# Patient Record
Sex: Female | Born: 1935 | Race: White | Hispanic: No | Smoking: Never smoker
Health system: Southern US, Community
[De-identification: ages and names within clinical notes are randomized; demographics above are authoritative.]

## PROBLEM LIST (undated history)

## (undated) DIAGNOSIS — N289 Disorder of kidney and ureter, unspecified: Secondary | ICD-10-CM

## (undated) DIAGNOSIS — E538 Deficiency of other specified B group vitamins: Secondary | ICD-10-CM

## (undated) DIAGNOSIS — R609 Edema, unspecified: Secondary | ICD-10-CM

## (undated) DIAGNOSIS — R748 Abnormal levels of other serum enzymes: Secondary | ICD-10-CM

## (undated) DIAGNOSIS — F0390 Unspecified dementia without behavioral disturbance: Secondary | ICD-10-CM

## (undated) DIAGNOSIS — E785 Hyperlipidemia, unspecified: Secondary | ICD-10-CM

## (undated) DIAGNOSIS — M109 Gout, unspecified: Secondary | ICD-10-CM

## (undated) DIAGNOSIS — R42 Dizziness and giddiness: Secondary | ICD-10-CM

## (undated) DIAGNOSIS — L988 Other specified disorders of the skin and subcutaneous tissue: Secondary | ICD-10-CM

## (undated) DIAGNOSIS — M810 Age-related osteoporosis without current pathological fracture: Secondary | ICD-10-CM

## (undated) DIAGNOSIS — K2100 Gastro-esophageal reflux disease with esophagitis, without bleeding: Secondary | ICD-10-CM

## (undated) DIAGNOSIS — K5730 Diverticulosis of large intestine without perforation or abscess without bleeding: Secondary | ICD-10-CM

## (undated) DIAGNOSIS — M25569 Pain in unspecified knee: Secondary | ICD-10-CM

## (undated) DIAGNOSIS — I10 Essential (primary) hypertension: Secondary | ICD-10-CM

## (undated) DIAGNOSIS — N200 Calculus of kidney: Secondary | ICD-10-CM

## (undated) HISTORY — DX: Abnormal levels of other serum enzymes: R74.8

## (undated) HISTORY — DX: Essential (primary) hypertension: I10

## (undated) HISTORY — DX: Dizziness and giddiness: R42

## (undated) HISTORY — DX: Diverticulosis of large intestine without perforation or abscess without bleeding: K57.30

## (undated) HISTORY — DX: Disorder of kidney and ureter, unspecified: N28.9

## (undated) HISTORY — DX: Gastro-esophageal reflux disease with esophagitis: K21.0

## (undated) HISTORY — DX: Deficiency of other specified B group vitamins: E53.8

## (undated) HISTORY — DX: Edema, unspecified: R60.9

## (undated) HISTORY — DX: Hyperlipidemia, unspecified: E78.5

## (undated) HISTORY — DX: Gastro-esophageal reflux disease with esophagitis, without bleeding: K21.00

## (undated) HISTORY — DX: Age-related osteoporosis without current pathological fracture: M81.0

## (undated) HISTORY — DX: Calculus of kidney: N20.0

## (undated) HISTORY — DX: Pain in unspecified knee: M25.569

## (undated) HISTORY — DX: Other specified disorders of the skin and subcutaneous tissue: L98.8

---

## 1972-02-07 HISTORY — PX: GANGLION CYST EXCISION: SHX1691

## 1982-02-06 HISTORY — PX: FRACTURE SURGERY: SHX138

## 1997-11-02 ENCOUNTER — Other Ambulatory Visit: Admission: RE | Admit: 1997-11-02 | Discharge: 1997-11-02 | Payer: Self-pay | Admitting: Family Medicine

## 1999-02-14 ENCOUNTER — Other Ambulatory Visit: Admission: RE | Admit: 1999-02-14 | Discharge: 1999-02-14 | Payer: Self-pay | Admitting: Family Medicine

## 1999-02-23 ENCOUNTER — Encounter: Admission: RE | Admit: 1999-02-23 | Discharge: 1999-02-23 | Payer: Self-pay | Admitting: Family Medicine

## 1999-02-23 ENCOUNTER — Encounter: Payer: Self-pay | Admitting: Family Medicine

## 2000-11-02 ENCOUNTER — Encounter: Payer: Self-pay | Admitting: Family Medicine

## 2000-11-02 ENCOUNTER — Encounter: Admission: RE | Admit: 2000-11-02 | Discharge: 2000-11-02 | Payer: Self-pay | Admitting: Family Medicine

## 2000-11-14 ENCOUNTER — Encounter: Payer: Self-pay | Admitting: Family Medicine

## 2000-11-14 ENCOUNTER — Encounter: Admission: RE | Admit: 2000-11-14 | Discharge: 2000-11-14 | Payer: Self-pay | Admitting: Family Medicine

## 2001-10-31 ENCOUNTER — Encounter: Payer: Self-pay | Admitting: Specialist

## 2001-10-31 ENCOUNTER — Encounter: Admission: RE | Admit: 2001-10-31 | Discharge: 2001-10-31 | Payer: Self-pay | Admitting: Specialist

## 2001-11-11 ENCOUNTER — Encounter: Admission: RE | Admit: 2001-11-11 | Discharge: 2001-11-11 | Payer: Self-pay | Admitting: Family Medicine

## 2001-11-11 ENCOUNTER — Encounter: Payer: Self-pay | Admitting: Family Medicine

## 2003-01-21 ENCOUNTER — Encounter: Admission: RE | Admit: 2003-01-21 | Discharge: 2003-01-21 | Payer: Self-pay | Admitting: Family Medicine

## 2005-02-06 HISTORY — PX: EXCISION OF ADNEXAL MASS: SHX5820

## 2005-08-03 ENCOUNTER — Emergency Department (HOSPITAL_COMMUNITY): Admission: EM | Admit: 2005-08-03 | Discharge: 2005-08-03 | Payer: Self-pay | Admitting: Emergency Medicine

## 2005-08-07 ENCOUNTER — Ambulatory Visit (HOSPITAL_COMMUNITY): Admission: RE | Admit: 2005-08-07 | Discharge: 2005-08-07 | Payer: Self-pay | Admitting: Emergency Medicine

## 2005-09-06 ENCOUNTER — Other Ambulatory Visit: Admission: RE | Admit: 2005-09-06 | Discharge: 2005-09-06 | Payer: Self-pay | Admitting: Gynecologic Oncology

## 2005-09-06 ENCOUNTER — Encounter (INDEPENDENT_AMBULATORY_CARE_PROVIDER_SITE_OTHER): Payer: Self-pay | Admitting: *Deleted

## 2005-09-06 ENCOUNTER — Ambulatory Visit: Admission: RE | Admit: 2005-09-06 | Discharge: 2005-09-06 | Payer: Self-pay | Admitting: Gynecologic Oncology

## 2005-10-03 ENCOUNTER — Encounter (INDEPENDENT_AMBULATORY_CARE_PROVIDER_SITE_OTHER): Payer: Self-pay | Admitting: Specialist

## 2005-10-03 ENCOUNTER — Ambulatory Visit (HOSPITAL_COMMUNITY): Admission: RE | Admit: 2005-10-03 | Discharge: 2005-10-04 | Payer: Self-pay | Admitting: Gynecologic Oncology

## 2005-10-17 ENCOUNTER — Ambulatory Visit: Admission: RE | Admit: 2005-10-17 | Discharge: 2005-10-17 | Payer: Self-pay | Admitting: Gynecologic Oncology

## 2006-07-11 ENCOUNTER — Ambulatory Visit: Payer: Self-pay | Admitting: Gastroenterology

## 2007-06-29 DIAGNOSIS — E669 Obesity, unspecified: Secondary | ICD-10-CM | POA: Insufficient documentation

## 2007-06-29 DIAGNOSIS — N83209 Unspecified ovarian cyst, unspecified side: Secondary | ICD-10-CM

## 2007-06-29 DIAGNOSIS — Z87442 Personal history of urinary calculi: Secondary | ICD-10-CM | POA: Insufficient documentation

## 2008-01-13 ENCOUNTER — Encounter: Admission: RE | Admit: 2008-01-13 | Discharge: 2008-02-06 | Payer: Self-pay | Admitting: Family Medicine

## 2008-02-10 ENCOUNTER — Encounter: Admission: RE | Admit: 2008-02-10 | Discharge: 2008-03-26 | Payer: Self-pay | Admitting: Family Medicine

## 2008-02-21 IMAGING — CT CT ABDOMEN W/ CM
1 of 3 series · 13 of 32 positions shown, 18 images · IV contrast (omnipaque)
Comparison: None relevant.

CLINICAL DATA: Right upper quadrant abdominal pain and nausea for 1 day.  No previous relevant surgery.
ABDOMEN CT WITH CONTRAST:
TECHNIQUE: Multidetector CT imaging of the abdomen was performed following the standard protocol during bolus administration of intravenous contrast.
Contrast:  125 cc Omnipaque 300.  Oral contrast was given.
TECHNIQUE: Multidetector CT imaging of the pelvis was performed following the standard protocol during bolus administration of intravenous contrast.

[Series 2: abd_pel 5.0 b40s st · axial · 0.77mm/px · z∈[-476,-100]mm · 13 of 85 slices shown, 18 images]
[im 5/85  soft-tissue]
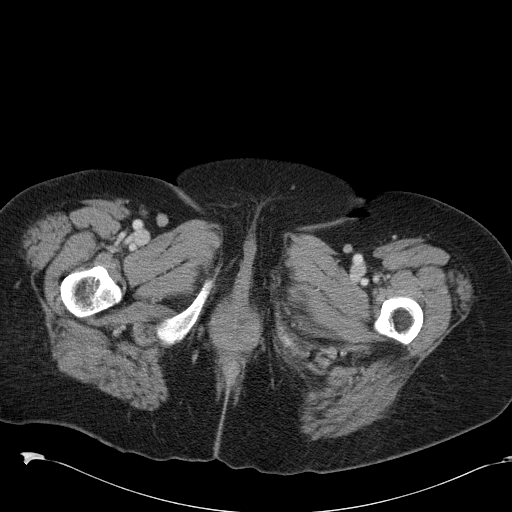
[im 5/85  bone]
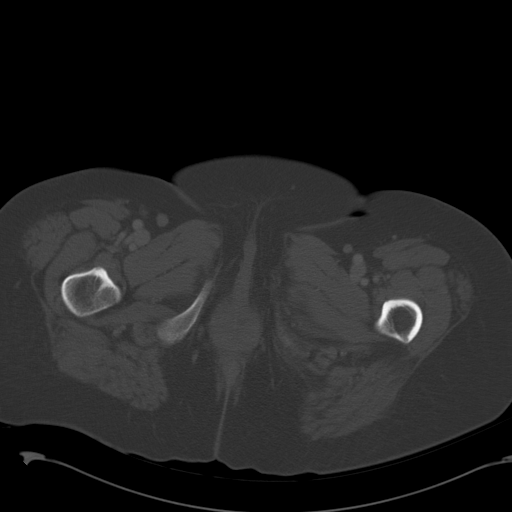
[im 14/85  soft-tissue]
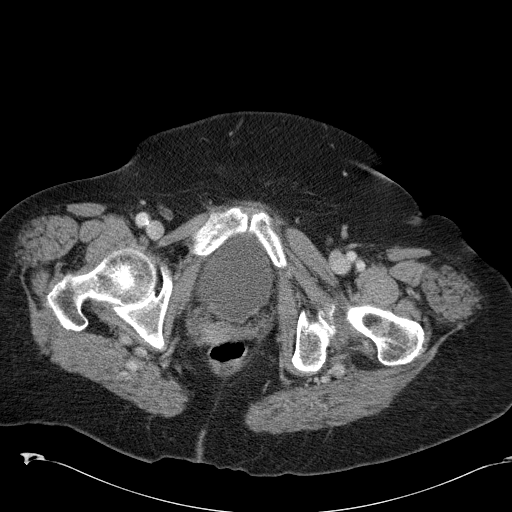
[im 18/85  soft-tissue]
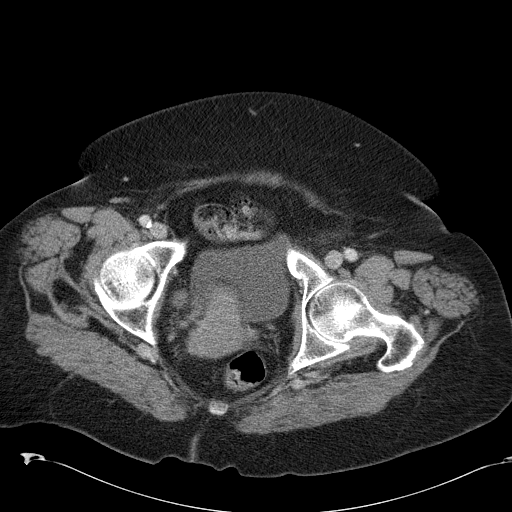
[im 27/85  soft-tissue]
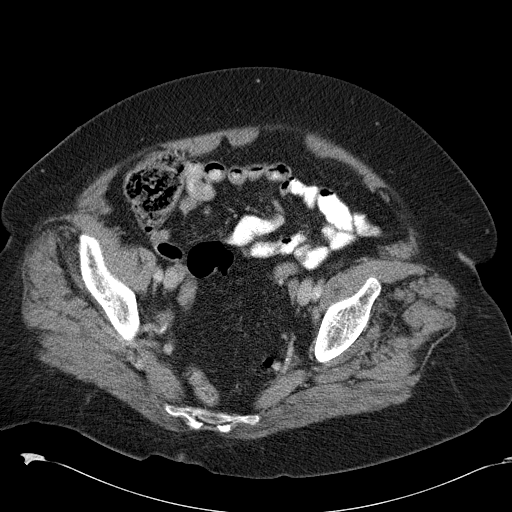
[im 31/85  soft-tissue]
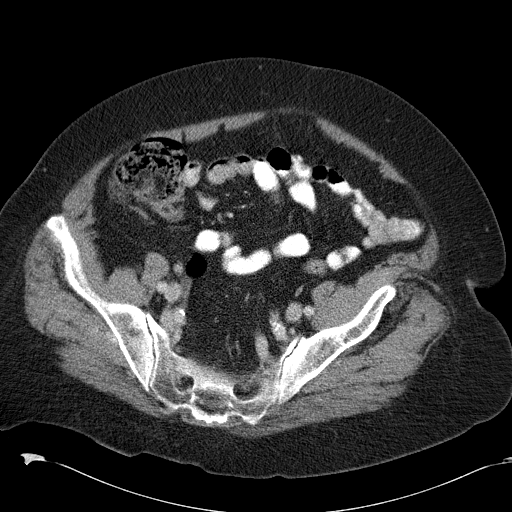
[im 40/85  soft-tissue]
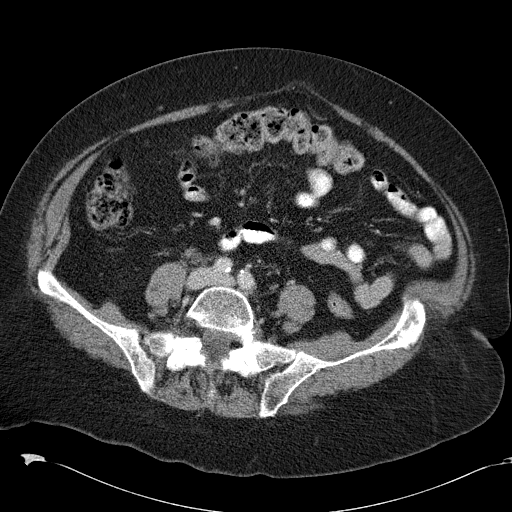
[im 45/85  soft-tissue]
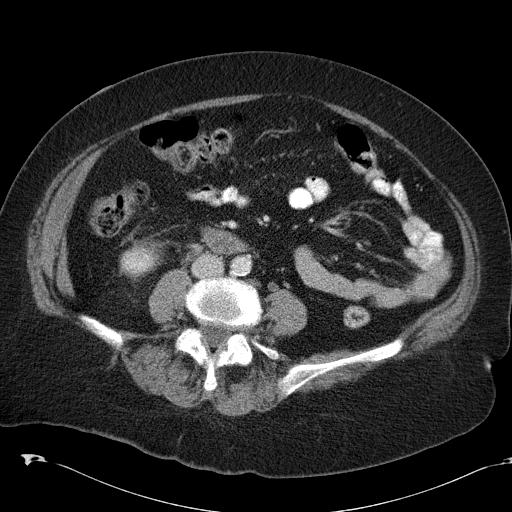
[im 54/85  soft-tissue]
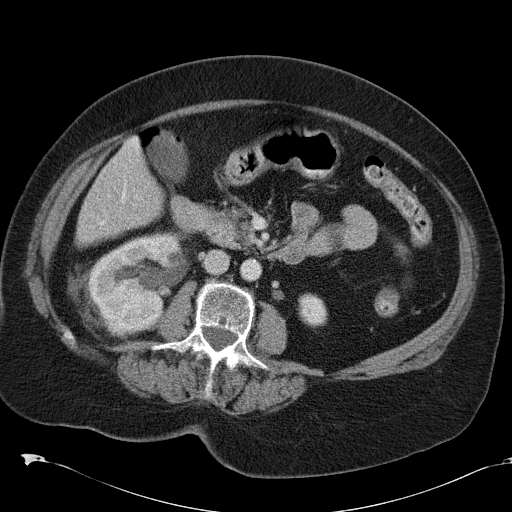
[im 58/85  soft-tissue]
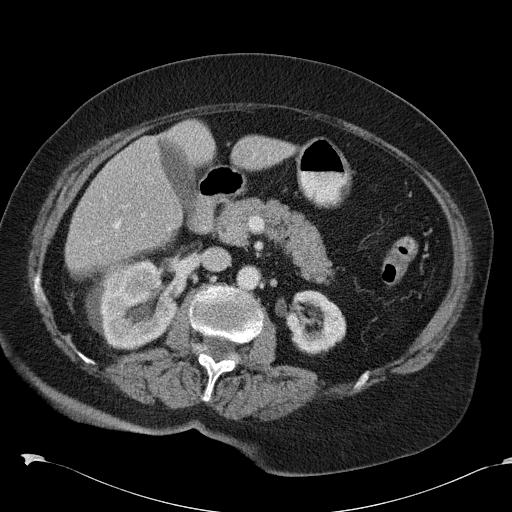
[im 58/85  bone]
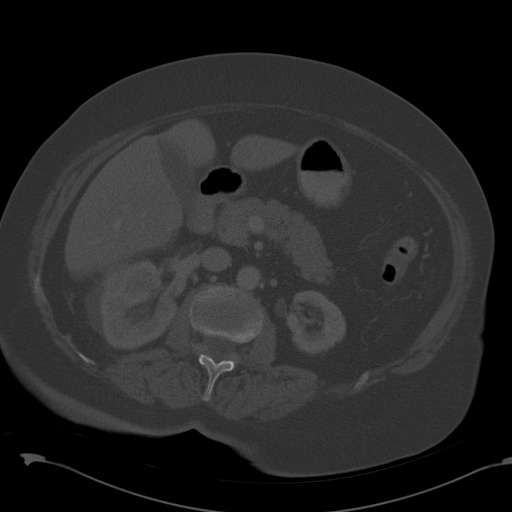
[im 67/85  soft-tissue]
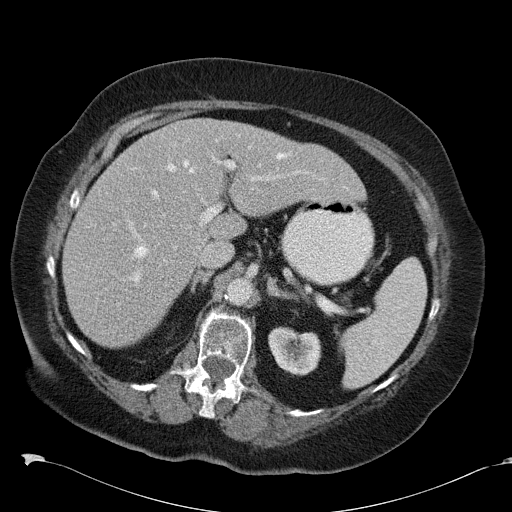
[im 67/85  lung]
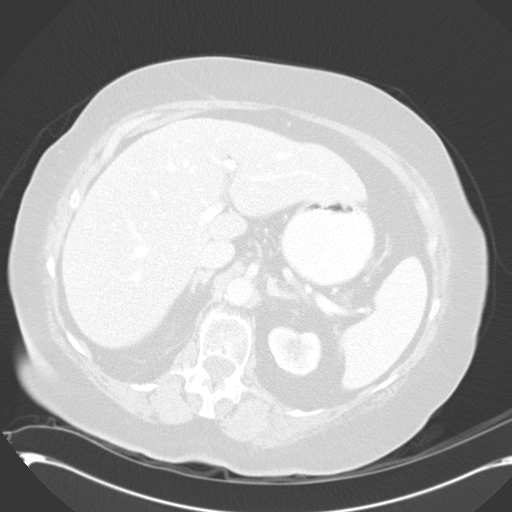
[im 71/85  soft-tissue]
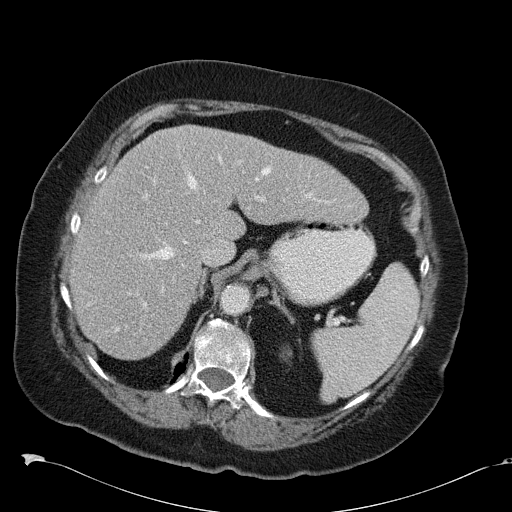
[im 71/85  lung]
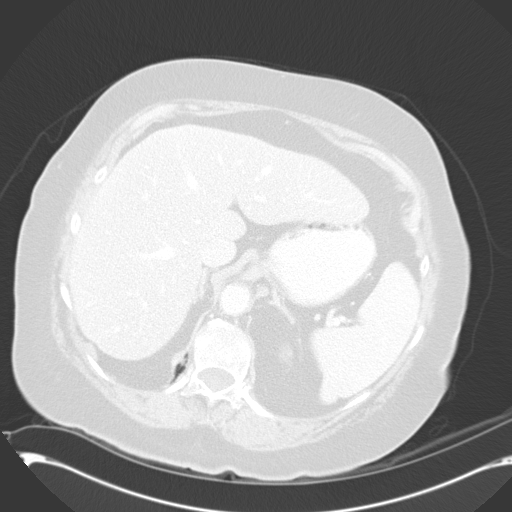
[im 76/85  lung]
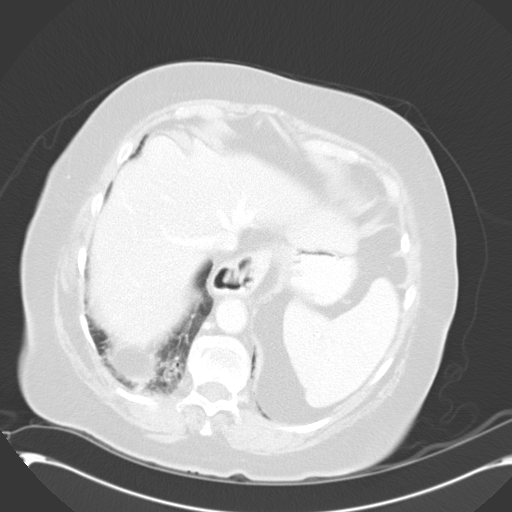
[im 80/85  soft-tissue]
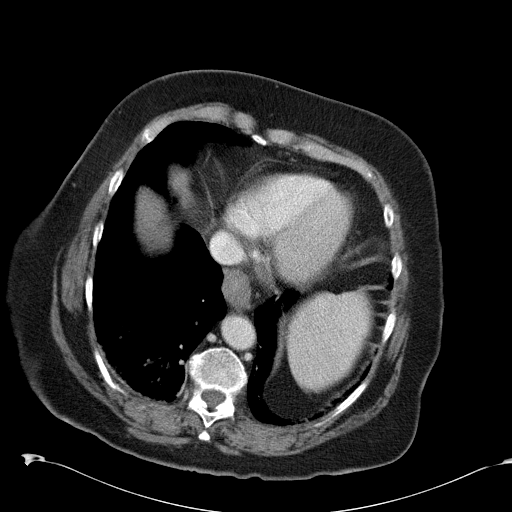
[im 80/85  lung]
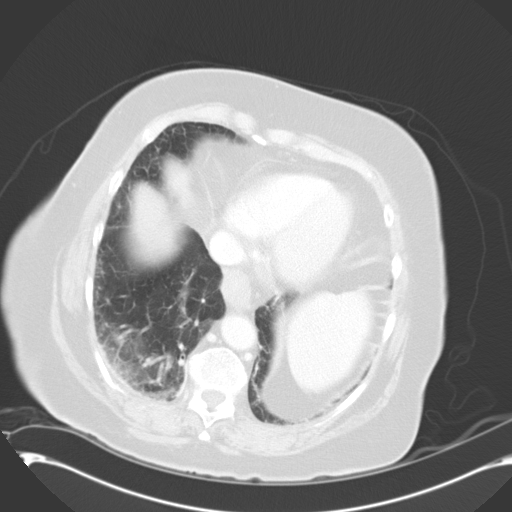

[13 of 32 positions shown; findings below may reference images not displayed]

FINDINGS: Fibrotic changes are present in both lung bases.  There is a small Bochdalek hernia on the right containing only fat.  There are bilateral renal calculi.  Moderate right sided hydronephrosis and hydroureter are present with perinephric inflammatory change and fluid on the right.  There is delayed enhancement and excretion from the right kidney due to distal ureteral calculi, described under the pelvic findings below.  The left kidney demonstrates cortical thinning, but no hydronephrosis.  There are renal cysts bilaterally.  
The liver, spleen, gallbladder, pancreas, and adrenal glands appear normal.  There is a moderate sized hiatal hernia.  No bowel abnormalities are apparent.
IMPRESSION: 1.  Obstructing distal right ureteral calculi, further described below.  There is moderate perinephric inflammatory change and fluid on the right. 
2.  The left kidney demonstrates cortical thinning, but no evidence of collecting system obstruction.  Bilateral renal calculi and cysts are present. 
PELVIS CT WITH CONTRAST:
FINDINGS: There are two small calculi in the distal right ureter measuring up to 4 mm in diameter on image 67.  These are not clearly seen on the patient?s scout images.  There is asymmetric enlargement of the right ovary, measuring up to 4.5 x 3.8 cm on image 66.  The left ovary and uterus appear unremarkable.  Sigmoid diverticular changes are present.  Small cystocele is suspected.
IMPRESSION: 1.  Obstructing distal right ureteral calculi as described with delay in contrast excretion. 
2.  Right ovarian enlargement is concerning for possible neoplasm in a patient this age.  Ultrasound correlation and gynecologic consultation are recommended.

## 2010-04-07 ENCOUNTER — Other Ambulatory Visit: Payer: Self-pay | Admitting: Internal Medicine

## 2010-04-07 DIAGNOSIS — Z780 Asymptomatic menopausal state: Secondary | ICD-10-CM

## 2010-04-07 DIAGNOSIS — Z1231 Encounter for screening mammogram for malignant neoplasm of breast: Secondary | ICD-10-CM

## 2010-04-19 ENCOUNTER — Ambulatory Visit
Admission: RE | Admit: 2010-04-19 | Discharge: 2010-04-19 | Disposition: A | Discharge status: Home / Self Care | Payer: Medicare Other | Source: Ambulatory Visit | Attending: Internal Medicine | Admitting: Internal Medicine

## 2010-04-19 DIAGNOSIS — Z1231 Encounter for screening mammogram for malignant neoplasm of breast: Secondary | ICD-10-CM

## 2010-04-19 DIAGNOSIS — Z780 Asymptomatic menopausal state: Secondary | ICD-10-CM

## 2010-04-19 LAB — HM MAMMOGRAPHY: HM Mammogram: NORMAL

## 2010-04-19 LAB — HM DEXA SCAN

## 2010-06-21 NOTE — Assessment & Plan Note (Signed)
[facility]                         GASTROENTEROLOGY OFFICE NOTE   HAIDEN, CLUCAS                      MRN:          [id number]  DATE:07/11/2006                            DOB:          Nov 12, 1922    REFERRING PHYSICIAN:  Quita Skye. Artis Flock, M.D.   REASON FOR REFERRAL:  Colorectal cancer screening.   HISTORY OF PRESENT ILLNESS:  Mrs. Paczkowski is a very nice 77 year old  white female, referred through the courtesy of Dr. Luanna Salk.  She has  not previously had colonoscopy or sigmoidoscopy.  She relates having a  virus in January and in February that led to diarrhea for several  days, but other than this, she has had no gastrointestinal complaints.  She specifically denies any abdominal pain, rectal pain, weight-loss,  melena, hematochezia or change in stool caliber.  There is no family  history of colon cancer, colon polyps or inflammatory bowel disease.   PAST MEDICAL HISTORY:  Kidney stones, status post removal of a benign  right ovarian cyst in August 2007, B12 deficiency.   CURRENT MEDICATIONS:  B12 injections monthly.   MEDICATION ALLERGIES:  None known.   SOCIAL HISTORY:  Per the handwritten form.   REVIEW OF SYSTEMS:  Per the handwritten form.   PHYSICAL EXAM:  Overweight, white female, in no acute distress, appears  younger than her stated age of 26.  Height 5 feet 6 inches, weight 202.6  pounds.  Blood pressure is 120/80, pulse 80 and regular.  HEENT EXAM:  Anicteric sclerae.  Oropharynx clear.  CHEST:  Clear to auscultation bilaterally.  CARDIAC:  Regular rate and rhythm without murmurs.  ABDOMEN:  Soft, nontender, nondistended.  Normoactive bowel sounds.  No  palpable organomegaly, masses or hernias.  RECTAL EXAMINATION:  Deferred to time of colonoscopy.  EXTREMITIES:  Without clubbing, cyanosis or edema.  NEUROLOGIC:  Alert and oriented times three.  Grossly nonfocal.   ASSESSMENT AND PLAN:  Average risk for colon cancer.   Patient is an 79-  year-old female, in much better than average health for her age.  Risks,  benefits, and alternatives to colonoscopy  with possible biopsy and possible polypectomy discussed with the patient  and she consents to proceed.  This will be scheduled electively.     Venita Lick. Russella Dar, MD, [facility]  Electronically Signed    MTS/MedQ  DD: 07/11/2006  DT: 07/11/2006  Job #: [id number]

## 2010-06-24 NOTE — Op Note (Signed)
Shannon Berg, Shannon Berg               ACCOUNT NO.:  [id number]   MEDICAL RECORD NO.:  [id number]          PATIENT TYPE:  OIB   LOCATION:  1321                         [facility]ACILITY[facility]  [facility]   PHYSICIAN:  Paola A. Duard Brady, MD    DATE OF BIRTH:  Dec 13, 1922   DATE OF PROCEDURE:  10/03/2005  DATE OF DISCHARGE:                                 OPERATIVE REPORT   PREOPERATIVE DIAGNOSIS:  1. Complex right adnexal mass.  2. Endometrial polyp.   POSTOPERATIVE DIAGNOSIS:  1. Mucinous cystadenoma.  2. Endometrial polyp.   PROCEDURE:  1. Laparoscopic bilateral salpingo-oophorectomy.  2. Dilation and curettage.   SURGEON:  Paola A. Duard Brady, MD. and Roseanna Rainbow, M.D.   ASSISTANT:  Telford Nab, R.N.   ANESTHESIOLOGIST:  Quentin Cornwall. Council Mechanic, M.D.   ANESTHESIA:  General.   ESTIMATED BLOOD LOSS:  Less than 25 mL.   IV FLUIDS:  1300 mL.   URINE OUTPUT:  100 mL.   SPECIMEN:  Bilateral tubes and ovaries, pelvic washings, and endometrial  curettings.   DISPOSITION:  Pathology specimens to pathology.   COMPLICATIONS:  None.   FINDINGS:  Included a normal-appearing cervix.  There was a 1-cm endometrial  polyp in the left cornua of the uterus.  Minimal endometrial curettings.  Within the abdomen and pelvis there was a 5 cm multiloculated mucinous-  appearing, right ovarian cyst that on frozen section was consistent with a  mucinous cystadenoma, no atypia.  Normal other intraabdominal survey.   DESCRIPTION OF PROCEDURE:  The patient was taken to the operating room,  placed in the supine position with her arms tucked at her sides with all  appropriate precautions.  General anesthesia was then induced.  She was then  placed in the dorsal lithotomy position, again, with all appropriate  precautions.  Shoulder blocks were placed with appropriate precautions,  again, being taken.   The abdomen and perineum were prepped in usual sterile fashion.  Foley  catheter was inserted into the bladder  under sterile condition.  A time-out  was performed to confirm the patient, surgical procedure, surgeons, and  allergies.   Speculum was placed into the vagina.  The anterior lip of the cervix was  grasped with a single-tooth sac.  The uterus was sounded with a uterine  sound and sounded to 6.5-to-7 cm.  It was midplane.  The cervix then was  sequentially dilated.  Curettings with the medium curettage curette was  performed with scant tissue.  Nasal polyp forceps were then placed in the  uterus and a 1-cm polyp was extracted.  Again, the uterus was retracted with  a grainy feel to the entire endometrial cavity.  The Toomey uterine  manipulator was then placed without difficulty.   She was then draped in the usual sterile fashion.  Then 2 mL of 1/4%  Marcaine was injected in the infraumbilical position.  A transverse  infraumbilical incision was made with a knife.  It was carried down to the  underlying fascia using curved Mayo scissors.  Fascia was identified,  grasped with Kocher clamps, tented, and entered sharply.  It  was then  secured with the UR-6 #0 Vicryl.  The Hasson was then placed.  The abdomen  was insufflated with CO2 gas and elevated to an intra-abdominal pressure to  50 mmHg.  Bilateral 5-mm lateral ports were placed in the appropriate  fashion confirming the placement with the Marcaine needle.  The incisions  were approximately 2-cm above the anterior-superior iliac spine.  The  trocars were placed under direct visualization.  The patient was then placed  in deep Trendelenburg position.  A 10/12 suprapubic port was then placed  under the usual conditions.  Washings were obtained.   Attention was first drawn to the right adnexa.  A window was made in the  posterior leaf of the broad ligament using monopolar cautery.  The ureter  was identified.  A window was made between the ureter on the right side and  the IP.  The IP was then coagulated with bipolar cautery using  Kleppingers.  The IP was then transected.  The utero-ovarian was skeletonized using  monopolar cautery; and the utero-ovarian vessels were coagulated using  Kleppingers, and transected.  The specimen was then placed in an EndoCatch  bag and delivered through the 10/12 suprapubic port.  It was sent for frozen  section.  A similar procedure was accomplished on the patient's left side.  The abdomen and pelvis were copiously irrigated.  Pedicles were observed  under low flow.  The appendix was noted to be unremarkable.  At this time  frozen section returned as a benign mucinous cystadenoma.  Again, pedicles  were dissected under low flow and noted to be hemostatic.  The trocars were  removed under direct visualization; and the trocar sites were noted to be  hemostatic.  The fascia of the infraumbilical port was closed with a running  #0 Vicryl.  The single #0 Vicryl suture was used to close the suprapubic  fascia.  The skin was closed using 3-0 Vicryl.  Steri-Strips were placed as  were Band-Aids.   The patient was taken to recovery room in stable condition, extubated.  All  instrument, needle, and Ray-Tec counts were correct x2.      Paola A. Duard Brady, MD  Electronically Signed     PAG/MEDQ  D:  10/03/2005  T:  10/03/2005  Job:  [id number]   cc:   Reuel Boom L. Eda Paschal, M.D.  Fax: [phone]   Quita Skye. Artis Flock, M.D.  Fax: [phone]   Bertram Millard. Dahlstedt, M.D.  Fax: [phone]   Telford Nab, R.N.  [address]  [place], [place] [address]   Roseanna Rainbow, M.D.  Fax: [phone]

## 2010-06-24 NOTE — Consult Note (Signed)
Shannon Berg, Shannon Berg               ACCOUNT NO.:  [id number]   MEDICAL RECORD NO.:  [id number]          PATIENT TYPE:  OUT   LOCATION:  GYN                          FACILITY[facility]  [facility]   PHYSICIAN:  Paola A. Duard Brady, MD    DATE OF BIRTH:  09-02-1922   DATE OF CONSULTATION:  10/17/2005  DATE OF DISCHARGE:                                   CONSULTATION   Shannon Berg is a very pleasant 77 year old who was referred to [place] by Reuel Boom  L. Eda Paschal, M.D.  She underwent a CT scan secondary to abdominal pain  which revealed an enlarged right ovary.  She had an ultrasound that showed  the right ovary to be enlarged measuring 5.2 cm with a 1.6 cm calcified  area.  In addition there was a 9-mm endometrial polyp.  She did not have any  evidence of free fluid or other disease and subsequently on October 03, 2005  underwent diagnostic laparoscopy, bilateral salpingo-oophorectomy and a D&C.  Operative findings included benign-appearing 5 cm right adnexal mass that on  frozen section was benign.  Final pathology was consistent with a  multilocular cystic mucinous neoplasm consistent with a mucinous  adenofibroma.  The left adnexa was unremarkable.  The fallopian tubes  bilaterally were unremarkable.  She did have an endometrial polyp admixed  with mucin but no hyperplasia or carcinoma.  She comes in today for her  postoperative check.  She is overall doing quite well from a postoperative  standpoint.  She only took pain medicine for a few days.  She denied any  bleeding.  Her incision had healed well as she was overall back to her usual  activities with normal return of bowel and bladder function.   PHYSICAL EXAMINATION:  Weight 201 pounds, blood pressure 130/78, well-  nourished, well-developed female who appears younger than stated age in no  acute distress.  ABDOMEN:  Shows well-healed surgical incisions.  She has minimal ecchymosis  around the infraumbilical port site as well as some thickness and  induration  of the subcu tissues which is normal postoperative.  Abdomen is otherwise  soft and nontender.  PELVIC:  External genitalia is within normal limits.  The vagina is  atrophic.  The cervix is visualized.  There is no visible lesions.  There is  no bleeding.  Bimanual examination: there is no cervical motion tenderness.   ASSESSMENT:  77 year old status post laparoscopic BSO for benign disease.   PLAN:  I reviewed the operative findings and pathology with the patient and  her daughter.  They are all very pleased with these results.  She knows that  she can follow up with Dr. Eda Paschal for her routine annual GYN care.  Should the need arise, we would be happy to see her in the future, should  there be other situation that require attention.  Otherwise she will follow  up with her primary physicians.      Paola A. Duard Brady, MD  Electronically Signed     PAG/MEDQ  D:  10/17/2005  T:  10/18/2005  Job:  [id number]   cc:  Daniel L. Eda Paschal, M.D.  Fax: [phone]   Quita Skye. Artis Flock, M.D.  Fax: [phone]   Bertram Millard. Dahlstedt, M.D.  Fax: [phone]   Telford Nab, R.N.  [address]  [place], [place] [address]   Roseanna Rainbow, M.D.  Fax: [phone]

## 2011-08-18 ENCOUNTER — Other Ambulatory Visit: Payer: Self-pay

## 2011-09-07 ENCOUNTER — Other Ambulatory Visit: Payer: Self-pay | Admitting: Dermatology

## 2012-04-29 ENCOUNTER — Ambulatory Visit (INDEPENDENT_AMBULATORY_CARE_PROVIDER_SITE_OTHER): Payer: Medicare Other | Admitting: *Deleted

## 2012-04-29 DIAGNOSIS — E538 Deficiency of other specified B group vitamins: Secondary | ICD-10-CM

## 2012-04-29 MED ORDER — CYANOCOBALAMIN 1000 MCG/ML IJ SOLN
1000.0000 ug | Freq: Once | INTRAMUSCULAR | Status: AC
  Administered 2012-04-29: 1000 ug via INTRAMUSCULAR

## 2012-05-28 ENCOUNTER — Other Ambulatory Visit: Payer: Self-pay | Admitting: *Deleted

## 2012-05-30 ENCOUNTER — Ambulatory Visit (INDEPENDENT_AMBULATORY_CARE_PROVIDER_SITE_OTHER): Payer: Medicare Other

## 2012-05-30 DIAGNOSIS — E538 Deficiency of other specified B group vitamins: Secondary | ICD-10-CM

## 2012-05-30 MED ORDER — CYANOCOBALAMIN 1000 MCG/ML IJ SOLN
1000.0000 ug | Freq: Once | INTRAMUSCULAR | Status: AC
  Administered 2012-05-30: 1000 ug via INTRAMUSCULAR

## 2012-06-24 ENCOUNTER — Other Ambulatory Visit: Payer: Self-pay | Admitting: Internal Medicine

## 2012-06-25 ENCOUNTER — Other Ambulatory Visit: Payer: Self-pay | Admitting: *Deleted

## 2012-06-25 MED ORDER — SIMVASTATIN 20 MG PO TABS: ORAL_TABLET | ORAL | Status: DC

## 2012-06-27 ENCOUNTER — Encounter: Payer: Self-pay | Admitting: *Deleted

## 2012-07-02 ENCOUNTER — Ambulatory Visit (INDEPENDENT_AMBULATORY_CARE_PROVIDER_SITE_OTHER): Payer: Medicare Other

## 2012-07-02 DIAGNOSIS — E538 Deficiency of other specified B group vitamins: Secondary | ICD-10-CM

## 2012-07-02 MED ORDER — CYANOCOBALAMIN 1000 MCG/ML IJ SOLN: 1000.0000 ug | INTRAMUSCULAR | Status: DC

## 2012-07-02 MED ORDER — CYANOCOBALAMIN 1000 MCG/ML IJ SOLN
1000.0000 ug | Freq: Once | INTRAMUSCULAR | Status: AC
  Administered 2012-07-02: 1000 ug via INTRAMUSCULAR

## 2012-08-05 ENCOUNTER — Ambulatory Visit (INDEPENDENT_AMBULATORY_CARE_PROVIDER_SITE_OTHER): Payer: Medicare Other

## 2012-08-05 DIAGNOSIS — E538 Deficiency of other specified B group vitamins: Secondary | ICD-10-CM

## 2012-08-05 MED ORDER — CYANOCOBALAMIN 1000 MCG/ML IJ SOLN
1000.0000 ug | Freq: Once | INTRAMUSCULAR | Status: AC
  Administered 2012-08-05: 1000 ug via INTRAMUSCULAR

## 2012-08-05 MED ORDER — CYANOCOBALAMIN 1000 MCG/ML IJ SOLN: 1000.0000 ug | INTRAMUSCULAR | Status: DC

## 2012-09-05 ENCOUNTER — Ambulatory Visit (INDEPENDENT_AMBULATORY_CARE_PROVIDER_SITE_OTHER): Payer: Medicare Other | Admitting: Nurse Practitioner

## 2012-09-05 DIAGNOSIS — E538 Deficiency of other specified B group vitamins: Secondary | ICD-10-CM

## 2012-09-05 MED ORDER — CYANOCOBALAMIN 1000 MCG/ML IJ SOLN
1000.0000 ug | Freq: Once | INTRAMUSCULAR | Status: AC
  Administered 2012-09-05: 1000 ug via INTRAMUSCULAR

## 2012-10-08 ENCOUNTER — Ambulatory Visit (INDEPENDENT_AMBULATORY_CARE_PROVIDER_SITE_OTHER): Payer: Medicare Other | Admitting: Nurse Practitioner

## 2012-10-08 DIAGNOSIS — E538 Deficiency of other specified B group vitamins: Secondary | ICD-10-CM

## 2012-10-08 MED ORDER — CYANOCOBALAMIN 1000 MCG/ML IJ SOLN
1000.0000 ug | Freq: Once | INTRAMUSCULAR | Status: AC
  Administered 2012-10-08: 1000 ug via INTRAMUSCULAR

## 2012-11-07 ENCOUNTER — Ambulatory Visit (INDEPENDENT_AMBULATORY_CARE_PROVIDER_SITE_OTHER): Payer: Medicare Other | Admitting: *Deleted

## 2012-11-07 DIAGNOSIS — E538 Deficiency of other specified B group vitamins: Secondary | ICD-10-CM

## 2012-11-07 DIAGNOSIS — Z23 Encounter for immunization: Secondary | ICD-10-CM

## 2012-11-07 MED ORDER — CYANOCOBALAMIN 1000 MCG/ML IJ SOLN
1000.0000 ug | Freq: Once | INTRAMUSCULAR | Status: AC
  Administered 2012-11-07: 1000 ug via INTRAMUSCULAR

## 2012-12-09 ENCOUNTER — Ambulatory Visit (INDEPENDENT_AMBULATORY_CARE_PROVIDER_SITE_OTHER): Payer: Medicare Other

## 2012-12-09 DIAGNOSIS — D519 Vitamin B12 deficiency anemia, unspecified: Secondary | ICD-10-CM

## 2012-12-09 DIAGNOSIS — D518 Other vitamin B12 deficiency anemias: Secondary | ICD-10-CM

## 2012-12-09 MED ORDER — CYANOCOBALAMIN 1000 MCG/ML IJ SOLN
1000.0000 ug | Freq: Once | INTRAMUSCULAR | Status: AC
  Administered 2012-12-09: 1000 ug via INTRAMUSCULAR

## 2012-12-24 ENCOUNTER — Other Ambulatory Visit: Payer: Self-pay | Admitting: Internal Medicine

## 2013-01-09 ENCOUNTER — Ambulatory Visit (INDEPENDENT_AMBULATORY_CARE_PROVIDER_SITE_OTHER): Payer: Medicare Other

## 2013-01-09 DIAGNOSIS — D518 Other vitamin B12 deficiency anemias: Secondary | ICD-10-CM

## 2013-01-09 DIAGNOSIS — D519 Vitamin B12 deficiency anemia, unspecified: Secondary | ICD-10-CM

## 2013-01-09 MED ORDER — CYANOCOBALAMIN 1000 MCG/ML IJ SOLN
1000.0000 ug | Freq: Once | INTRAMUSCULAR | Status: AC
  Administered 2013-01-09: 1000 ug via INTRAMUSCULAR

## 2013-01-23 ENCOUNTER — Other Ambulatory Visit: Payer: Self-pay | Admitting: Internal Medicine

## 2013-02-11 ENCOUNTER — Ambulatory Visit (INDEPENDENT_AMBULATORY_CARE_PROVIDER_SITE_OTHER): Payer: Medicare Other

## 2013-02-11 DIAGNOSIS — E538 Deficiency of other specified B group vitamins: Secondary | ICD-10-CM

## 2013-02-11 MED ORDER — CYANOCOBALAMIN 1000 MCG/ML IJ SOLN
1000.0000 ug | Freq: Once | INTRAMUSCULAR | Status: AC
Start: 2013-02-11 — End: 2013-02-11
  Administered 2013-02-11: 1000 ug via INTRAMUSCULAR

## 2013-03-14 ENCOUNTER — Ambulatory Visit: Payer: Medicare Other

## 2013-04-10 ENCOUNTER — Encounter: Payer: Self-pay | Admitting: Internal Medicine

## 2013-04-10 ENCOUNTER — Ambulatory Visit (INDEPENDENT_AMBULATORY_CARE_PROVIDER_SITE_OTHER): Payer: Medicare Other | Admitting: Internal Medicine

## 2013-04-10 VITALS — BP 118/84 | HR 92 | Temp 97.4°F | Wt 178.0 lb

## 2013-04-10 DIAGNOSIS — D519 Vitamin B12 deficiency anemia, unspecified: Secondary | ICD-10-CM | POA: Insufficient documentation

## 2013-04-10 DIAGNOSIS — I10 Essential (primary) hypertension: Secondary | ICD-10-CM

## 2013-04-10 DIAGNOSIS — J069 Acute upper respiratory infection, unspecified: Secondary | ICD-10-CM

## 2013-04-10 DIAGNOSIS — D518 Other vitamin B12 deficiency anemias: Secondary | ICD-10-CM

## 2013-04-10 DIAGNOSIS — B9789 Other viral agents as the cause of diseases classified elsewhere: Principal | ICD-10-CM

## 2013-04-10 DIAGNOSIS — E785 Hyperlipidemia, unspecified: Secondary | ICD-10-CM | POA: Insufficient documentation

## 2013-04-10 MED ORDER — CYANOCOBALAMIN 1000 MCG/ML IJ SOLN
1000.0000 ug | Freq: Once | INTRAMUSCULAR | Status: AC
  Administered 2013-04-10: 1000 ug via INTRAMUSCULAR

## 2013-04-10 NOTE — Patient Instructions (Signed)
Coricidin bp--cough syrup for coughing  Mucinex without any letters behind it Warm humidity in shower for congestion Plenty of water Adequate sleep Let me know if you develop a fever, chills, or you are getting worse, not better in a week or so.

## 2013-04-10 NOTE — Progress Notes (Signed)
Patient ID: Shannon Berg, female   DOB: 1922/06/19, 80 y.o.   MRN: [id number]   Location:  [facility]  Code Status: DNR, has HCPOA and living will paperwork from old system to be scanned into epic   No Known Allergies  Chief Complaint  Patient presents with  . URI    Cough and runny nose x 3 days     HPI: Patient is a 80 y.o.  seen in the office today for cough and runny nose for 3 days.   Eyes itch, as well--have been watery.  Clear rhinorrhea--when gets up in the morning, coughing and spitting up mucus.  No headache.  A little pressure in the ears.  No fever, chills.    Got her B12 shot today.    Review of Systems:  Review of Systems  Constitutional: Positive for malaise/fatigue. Negative for fever, chills and weight loss.  HENT: Positive for congestion and ear pain. Negative for ear discharge, hearing loss, nosebleeds, sore throat and tinnitus.   Eyes: Negative for blurred vision.  Respiratory: Negative for shortness of breath and stridor.   Cardiovascular: Negative for chest pain.  Gastrointestinal: Negative for abdominal pain, constipation, blood in stool and melena.  Genitourinary: Negative for dysuria.  Musculoskeletal: Negative for falls and myalgias.  Skin: Negative for rash.       Prior surgical site with grafted skin on left side of nose  Neurological: Negative for dizziness, loss of consciousness, weakness and headaches.  Psychiatric/Behavioral: Negative for depression and memory loss.    Past Medical History  Diagnosis Date  . Other specified disorder of skin   . Unspecified disorder of kidney and ureter   . Senile osteoporosis   . Other nonspecific abnormal serum enzyme levels   . Essential hypertension, benign   . Pain in joint, lower leg   . Other B-complex deficiencies   . Other and unspecified hyperlipidemia   . Reflux esophagitis   . Calculus of kidney   . Dizziness and giddiness   . Edema   .  Diverticulosis of colon (without mention of hemorrhage)     Past Surgical History  Procedure Laterality Date  . Ganglion cyst excision  1974  . Fracture surgery N/A 1984    wrist  . Excision of adnexal mass  2007    Social History:   reports that she has never smoked. She does not have any smokeless tobacco history on file. She reports that she does not drink alcohol or use illicit drugs.  Family History  Problem Relation Age of Onset  . Cancer Brother     Medications: Patient's Medications  New Prescriptions   No medications on file  Previous Medications   ASPIRIN EC 81 MG TABLET    Take 81 mg by mouth daily.   LISINOPRIL-HYDROCHLOROTHIAZIDE (PRINZIDE,ZESTORETIC) 10-12.5 MG PER TABLET    TAKE ONE TABLET DAILY FOR HIGH BLOOD PRESSURE.   SIMVASTATIN (ZOCOR) 20 MG TABLET    TAKE 1 TABLET ONCE DAILY TO LOWER CHOLESTEROL.  Modified Medications   No medications on file  Discontinued Medications   No medications on file     Physical Exam: Filed Vitals:   04/10/13 1319  BP: 118/84  Pulse: 92  Temp: 97.4 F (36.3 C)  TempSrc: Oral  Weight: 178 lb (80.74 kg)  SpO2: 97%  Physical Exam  Constitutional: She appears well-developed and well-nourished. No distress.  HENT:  Head: Normocephalic and atraumatic.  Right Ear: External ear normal.  Left Ear: External ear normal.  Postnasal drip seen, mild erythema; TMs pink bilaterally with normal light reflex, mild cerumen in bilateral canals  Neck: Normal range of motion. Neck supple. No JVD present.  Cardiovascular: Normal rate, regular rhythm, normal heart sounds and intact distal pulses.   Pulmonary/Chest: Effort normal and breath sounds normal. No respiratory distress.  Lymphadenopathy:    She has no cervical adenopathy.  Psychiatric: She has a normal mood and affect.   Assessment/Plan 1. Viral upper respiratory tract infection with cough -recommend conservative therapy with coricidin bp, mucinex, drink plenty of water,  use warm humidity in shower -she will call if she develops fever, chills or gets worse instead of better in the next week  2. B12 deficiency anemia -check level and blood counts next visit - cyanocobalamin ((VITAMIN B-12)) injection 1,000 mcg; Inject 1 mL (1,000 mcg total) into the muscle once. - CBC with Differential; Future - B12 and Folate Panel; Future  3. Essential hypertension, benign -bp at goal with current therapy--check labs due to meds - Comprehensive metabolic panel; Future  4. Hyperlipidemia LDL goal < 100 -cont statin therapy - Comprehensive metabolic panel; Future - Lipid panel; Future  Labs/tests ordered: Orders Placed This Encounter  Procedures  . CBC with Differential    Standing Status: Future     Number of Occurrences:      Standing Expiration Date: 07/11/2013  . Comprehensive metabolic panel    Standing Status: Future     Number of Occurrences:      Standing Expiration Date: 07/11/2013  . Lipid panel    Standing Status: Future     Number of Occurrences:      Standing Expiration Date: 07/11/2013  . B12 and Folate Panel    Standing Status: Future     Number of Occurrences:      Standing Expiration Date: 07/11/2013   Next appt:  1 month labs before

## 2013-05-08 ENCOUNTER — Other Ambulatory Visit: Payer: Medicare Other

## 2013-05-08 DIAGNOSIS — E785 Hyperlipidemia, unspecified: Secondary | ICD-10-CM

## 2013-05-08 DIAGNOSIS — D519 Vitamin B12 deficiency anemia, unspecified: Secondary | ICD-10-CM

## 2013-05-08 DIAGNOSIS — I10 Essential (primary) hypertension: Secondary | ICD-10-CM

## 2013-05-09 LAB — CBC WITH DIFFERENTIAL/PLATELET
Basophils Absolute: 0 10*3/uL (ref 0.0–0.2)
Basos: 1 %
Eos: 3 %
Eosinophils Absolute: 0.2 10*3/uL (ref 0.0–0.4)
HCT: 34.9 % (ref 34.0–46.6)
Hemoglobin: 11.3 g/dL (ref 11.1–15.9)
Immature Grans (Abs): 0 10*3/uL (ref 0.0–0.1)
Immature Granulocytes: 0 %
Lymphocytes Absolute: 1.2 10*3/uL (ref 0.7–3.1)
Lymphs: 18 %
MCH: 26.3 pg — ABNORMAL LOW (ref 26.6–33.0)
MCHC: 32.4 g/dL (ref 31.5–35.7)
MCV: 81 fL (ref 79–97)
Monocytes Absolute: 0.4 10*3/uL (ref 0.1–0.9)
Monocytes: 7 %
Neutrophils Absolute: 4.8 10*3/uL (ref 1.4–7.0)
Neutrophils Relative %: 71 %
RBC: 4.29 x10E6/uL (ref 3.77–5.28)
RDW: 14.8 % (ref 12.3–15.4)
WBC: 6.6 10*3/uL (ref 3.4–10.8)

## 2013-05-09 LAB — COMPREHENSIVE METABOLIC PANEL
ALT: 12 IU/L (ref 0–32)
AST: 18 IU/L (ref 0–40)
Albumin/Globulin Ratio: 2 (ref 1.1–2.5)
Albumin: 4.5 g/dL (ref 3.5–4.7)
Alkaline Phosphatase: 73 IU/L (ref 39–117)
BUN/Creatinine Ratio: 25 (ref 11–26)
BUN: 40 mg/dL — ABNORMAL HIGH (ref 8–27)
CO2: 19 mmol/L (ref 18–29)
Calcium: 9.9 mg/dL (ref 8.7–10.3)
Chloride: 109 mmol/L — ABNORMAL HIGH (ref 97–108)
Creatinine, Ser: 1.6 mg/dL — ABNORMAL HIGH (ref 0.57–1.00)
GFR calc Af Amer: 33 mL/min/{1.73_m2} — ABNORMAL LOW (ref >59)
GFR calc non Af Amer: 29 mL/min/{1.73_m2} — ABNORMAL LOW (ref >59)
Globulin, Total: 2.2 g/dL (ref 1.5–4.5)
Glucose: 97 mg/dL (ref 65–99)
Potassium: 5.1 mmol/L (ref 3.5–5.2)
Sodium: 145 mmol/L — ABNORMAL HIGH (ref 134–144)
Total Bilirubin: 1.4 mg/dL — ABNORMAL HIGH (ref 0.0–1.2)
Total Protein: 6.7 g/dL (ref 6.0–8.5)

## 2013-05-09 LAB — LIPID PANEL
Chol/HDL Ratio: 2.9 ratio units (ref 0.0–4.4)
Cholesterol, Total: 157 mg/dL (ref 100–199)
HDL: 54 mg/dL (ref >39)
LDL Calculated: 82 mg/dL (ref 0–99)
Triglycerides: 103 mg/dL (ref 0–149)
VLDL Cholesterol Cal: 21 mg/dL (ref 5–40)

## 2013-05-09 LAB — B12 AND FOLATE PANEL
Folate: 12.8 ng/mL (ref >3.0)
Vitamin B-12: 559 pg/mL (ref 211–946)

## 2013-05-12 ENCOUNTER — Ambulatory Visit (INDEPENDENT_AMBULATORY_CARE_PROVIDER_SITE_OTHER): Payer: Medicare Other | Admitting: Internal Medicine

## 2013-05-12 ENCOUNTER — Encounter: Payer: Self-pay | Admitting: Internal Medicine

## 2013-05-12 VITALS — BP 126/74 | HR 82 | Temp 97.9°F | Resp 20 | Wt 180.6 lb

## 2013-05-12 DIAGNOSIS — Z23 Encounter for immunization: Secondary | ICD-10-CM

## 2013-05-12 DIAGNOSIS — D518 Other vitamin B12 deficiency anemias: Secondary | ICD-10-CM

## 2013-05-12 DIAGNOSIS — I10 Essential (primary) hypertension: Secondary | ICD-10-CM

## 2013-05-12 DIAGNOSIS — E785 Hyperlipidemia, unspecified: Secondary | ICD-10-CM

## 2013-05-12 DIAGNOSIS — J0100 Acute maxillary sinusitis, unspecified: Secondary | ICD-10-CM

## 2013-05-12 DIAGNOSIS — D519 Vitamin B12 deficiency anemia, unspecified: Secondary | ICD-10-CM

## 2013-05-12 DIAGNOSIS — J309 Allergic rhinitis, unspecified: Secondary | ICD-10-CM

## 2013-05-12 NOTE — Progress Notes (Signed)
Patient ID: Shannon Berg, female   DOB: 12/05/1922, 80 y.o.   MRN: [id number]   Location:  [facility]  Code Status: DNR, has advance directive and living will/hcpoa to be scanned  No Known Allergies  Chief Complaint  Patient presents with  . Follow-up    discuss labs    HPI: Patient is a 80 y.o. white female seen in the office today for medical mgt of chronic diseases and to review labwork.  She has increased sinus congestion and allergies lately in spring.  Goes to sleep at night, wakes up at 4-5am to urinate, has to cough and spit up a bunch of mucus.  Has been going on for several weeks.  Wonders if the cleaning agents have made her congestion worse (carpet cleaners).  Lives at [facility] for about 12 years.    She is walking around her quite a bit.  She uses a cane for balance and has not fallen.    Her memory remains good and she is fully oriented.    She is accompanied by her daughter today.    Her mood is good and PHQ2 was done.  Review of Systems:  Review of Systems  Constitutional: Negative for fever, chills and malaise/fatigue.  HENT: Positive for congestion and sore throat. Negative for ear pain and hearing loss.   Eyes: Negative for blurred vision.       Has eye appt coming up  Respiratory: Positive for cough. Negative for sputum production.   Cardiovascular: Negative for chest pain and leg swelling.  Gastrointestinal: Negative for nausea, constipation, blood in stool and melena.  Genitourinary: Negative for frequency.  Musculoskeletal: Negative for falls.       Walks around [place] with cane  Skin: Negative for rash.  Neurological: Positive for tingling and sensory change. Negative for dizziness, loss of consciousness, weakness and headaches.  Endo/Heme/Allergies: Bruises/bleeds easily.  Psychiatric/Behavioral: Negative for depression and memory loss. The patient is not nervous/anxious and does not have insomnia.       Past Medical History  Diagnosis Date  . Other specified disorder of skin   . Unspecified disorder of kidney and ureter   . Senile osteoporosis   . Other nonspecific abnormal serum enzyme levels   . Essential hypertension, benign   . Pain in joint, lower leg   . Other B-complex deficiencies   . Other and unspecified hyperlipidemia   . Reflux esophagitis   . Calculus of kidney   . Dizziness and giddiness   . Edema   . Diverticulosis of colon (without mention of hemorrhage)   . Hyperlipidemia LDL goal < 100     Past Surgical History  Procedure Laterality Date  . Ganglion cyst excision  1974  . Fracture surgery N/A 1984    wrist  . Excision of adnexal mass  2007    Social History:   reports that she has never smoked. She does not have any smokeless tobacco history on file. She reports that she does not drink alcohol or use illicit drugs.  Family History  Problem Relation Age of Onset  . Cancer Brother     Medications: Patient's Medications  New Prescriptions   No medications on file  Previous Medications   ASPIRIN EC 81 MG TABLET    Take 81 mg by mouth daily.   CALCIUM CARBONATE (OS-CAL) 600 MG TABS TABLET    Take 600 mg by mouth daily.   LISINOPRIL-HYDROCHLOROTHIAZIDE (PRINZIDE,ZESTORETIC) 10-12.5 MG PER TABLET  TAKE ONE TABLET DAILY FOR HIGH BLOOD PRESSURE.   SIMVASTATIN (ZOCOR) 20 MG TABLET    TAKE 1 TABLET ONCE DAILY TO LOWER CHOLESTEROL.  Modified Medications   No medications on file  Discontinued Medications   No medications on file     Physical Exam: Filed Vitals:   05/12/13 1139  BP: 126/74  Pulse: 82  Temp: 97.9 F (36.6 C)  TempSrc: Oral  Resp: 20  Weight: 180 lb 9.6 oz (81.92 kg)  SpO2: 96%  Physical Exam  Constitutional: She is oriented to person, place, and time. She appears well-developed and well-nourished. No distress.  Cardiovascular: Normal rate, regular rhythm, normal heart sounds and intact distal pulses.   Pulmonary/Chest:  Effort normal and breath sounds normal. No respiratory distress.  Abdominal: Soft. Bowel sounds are normal. She exhibits no distension and no mass. There is no tenderness.  Musculoskeletal: Normal range of motion. She exhibits edema. She exhibits no tenderness.  Ambulates with cane  Neurological: She is alert and oriented to person, place, and time.  Skin: Skin is warm and dry.  Psychiatric: She has a normal mood and affect.     Labs reviewed: Basic Metabolic Panel:  Recent Labs  05/08/13 0926  NA 145*  K 5.1  CL 109*  CO2 19  GLUCOSE 97  BUN 40*  CREATININE 1.60*  CALCIUM 9.9   Liver Function Tests:  Recent Labs  05/08/13 0926  AST 18  ALT 12  ALKPHOS 73  BILITOT 1.4*  PROT 6.7  CBC:  Recent Labs  05/08/13 0926  WBC 6.6  NEUTROABS 4.8  HGB 11.3  HCT 34.9  MCV 81   Lipid Panel:  Recent Labs  05/08/13 0926  HDL 54  LDLCALC 82  TRIG 103  CHOLHDL 2.9   Assessment/Plan 1. Allergic rhinitis -worsened recently--advised she may temporarily use zyrtec for her congestion due to #2  2. Sinusitis, acute maxillary -again, advised she can use zyrtec short term only due to potential adverse effects of antihistamines long term in elderly pts  3. B12 deficiency anemia -will reduce b12 shots to every 3 mos to maintain levels--right now she is right in the middle of the therapeutic range  4. Essential hypertension, benign -at goal with ace/diuretic combo  5. Hyperlipidemia LDL goal < 100 -at goal with simvastatin  6. Need for vaccination with 13-polyvalent pneumococcal conjugate vaccine - Pneumococcal conjugate vaccine 13-valent was given  Labs/tests ordered:   Orders Placed This Encounter  Procedures  . Pneumococcal conjugate vaccine 13-valent    Next appt:  3 mos for b12 injection and f/u visit

## 2013-05-12 NOTE — Patient Instructions (Signed)
May use zyrtec just for a week or two for allergies.  Do not continue long term.

## 2013-07-22 ENCOUNTER — Other Ambulatory Visit: Payer: Self-pay | Admitting: Nurse Practitioner

## 2013-08-11 ENCOUNTER — Encounter: Payer: Self-pay | Admitting: Internal Medicine

## 2013-08-11 ENCOUNTER — Ambulatory Visit (INDEPENDENT_AMBULATORY_CARE_PROVIDER_SITE_OTHER): Payer: Medicare Other | Admitting: Internal Medicine

## 2013-08-11 VITALS — BP 124/86 | HR 84 | Temp 97.9°F | Wt 180.0 lb

## 2013-08-11 DIAGNOSIS — M1710 Unilateral primary osteoarthritis, unspecified knee: Secondary | ICD-10-CM | POA: Insufficient documentation

## 2013-08-11 DIAGNOSIS — N184 Chronic kidney disease, stage 4 (severe): Secondary | ICD-10-CM | POA: Insufficient documentation

## 2013-08-11 DIAGNOSIS — I10 Essential (primary) hypertension: Secondary | ICD-10-CM

## 2013-08-11 DIAGNOSIS — D518 Other vitamin B12 deficiency anemias: Secondary | ICD-10-CM

## 2013-08-11 DIAGNOSIS — D519 Vitamin B12 deficiency anemia, unspecified: Secondary | ICD-10-CM

## 2013-08-11 DIAGNOSIS — M1711 Unilateral primary osteoarthritis, right knee: Secondary | ICD-10-CM

## 2013-08-11 NOTE — Progress Notes (Signed)
Patient ID: Shannon Berg, female   DOB: 1922/10/23, 80 y.o.   MRN: [id number]   Location:  [facility] [place] [facility]  Code Status: DNR, has MOST and living will, HCPOA  No Known Allergies  Chief Complaint  Patient presents with  . Medical Management of Chronic Issues    blood pressure, cholesterol, B12 deficiency. Here with daughter [name]    HPI: Patient is a 80 y.o. white female seen in the office today for medical mgt of chronic diseases.  Right knee is acting up.  Uses voltaren gel on the knee--helps some.  Went down to [employer]  two weeks ago and not painful when there.  Was walking a lot more.  Walked to breakfast.  Discussed increasing exercise at home, also.  Has had shots by Dr. Gladstone Lighter in knee with benefit.    Is trying to drink more water due to CKD and higher sodium when seen in April.    Review of Systems:  Review of Systems  Constitutional: Negative for fever and malaise/fatigue.  Eyes: Negative for blurred vision.  Respiratory: Negative for shortness of breath.   Cardiovascular: Negative for chest pain.  Gastrointestinal: Negative for abdominal pain.  Genitourinary: Negative for dysuria.  Musculoskeletal: Negative for falls.       Needs arms to get up and go  Neurological: Negative for dizziness, loss of consciousness, weakness and headaches.  Psychiatric/Behavioral: Negative for depression and memory loss.    Past Medical History  Diagnosis Date  . Other specified disorder of skin   . Unspecified disorder of kidney and ureter   . Senile osteoporosis   . Other nonspecific abnormal serum enzyme levels   . Essential hypertension, benign   . Pain in joint, lower leg   . Other B-complex deficiencies   . Other and unspecified hyperlipidemia   . Reflux esophagitis   . Calculus of kidney   . Dizziness and giddiness   . Edema   . Diverticulosis of colon (without mention of hemorrhage)   . Hyperlipidemia LDL goal < 100     Past Surgical  History  Procedure Laterality Date  . Ganglion cyst excision  1974  . Fracture surgery N/A 1984    wrist  . Excision of adnexal mass  2007    Social History:   reports that she has never smoked. She does not have any smokeless tobacco history on file. She reports that she does not drink alcohol or use illicit drugs.  Family History  Problem Relation Age of Onset  . Cancer Brother     Medications: Patient's Medications  New Prescriptions   No medications on file  Previous Medications   ASPIRIN EC 81 MG TABLET    Take 81 mg by mouth daily.   CALCIUM CARBONATE (OS-CAL) 600 MG TABS TABLET    Take 600 mg by mouth daily.   LISINOPRIL-HYDROCHLOROTHIAZIDE (PRINZIDE,ZESTORETIC) 10-12.5 MG PER TABLET    TAKE ONE TABLET DAILY FOR HIGH BLOOD PRESSURE.   SIMVASTATIN (ZOCOR) 20 MG TABLET    TAKE 1 TABLET ONCE DAILY TO LOWER CHOLESTEROL.  Modified Medications   No medications on file  Discontinued Medications   No medications on file     Physical Exam: Filed Vitals:   08/11/13 1029  BP: 124/86  Pulse: 84  Temp: 97.9 F (36.6 C)  TempSrc: Oral  Weight: 180 lb (81.647 kg)  SpO2: 95%  Physical Exam  Constitutional: She is oriented to person, place, and time. She appears well-developed and  well-nourished. No distress.  Cardiovascular: Normal rate, regular rhythm, normal heart sounds and intact distal pulses.   No murmur heard. Pulmonary/Chest: Effort normal and breath sounds normal. No respiratory distress.  Abdominal: Soft. Bowel sounds are normal. She exhibits no distension and no mass. There is no tenderness.  Musculoskeletal: Normal range of motion. She exhibits tenderness. She exhibits no edema.  Right knee  Neurological: She is alert and oriented to person, place, and time.  Skin: Skin is warm and dry.  Has new actinic keratosis of left hand and left neck  Psychiatric: She has a normal mood and affect. Her behavior is normal. Judgment and thought content normal.     Labs  reviewed: Basic Metabolic Panel:  Recent Labs  05/08/13 0926  NA 145*  K 5.1  CL 109*  CO2 19  GLUCOSE 97  BUN 40*  CREATININE 1.60*  CALCIUM 9.9   Liver Function Tests:  Recent Labs  05/08/13 0926  AST 18  ALT 12  ALKPHOS 73  BILITOT 1.4*  PROT 6.7  CBC:  Recent Labs  05/08/13 0926  WBC 6.6  NEUTROABS 4.8  HGB 11.3  HCT 34.9  MCV 81   Lipid Panel:  Recent Labs  05/08/13 0926  HDL 54  LDLCALC 82  TRIG 103  CHOLHDL 2.9  Assessment/Plan 1. Chronic kidney disease, stage IV (severe) -will f/u creatinine today  -has been trying to do better about hydration--encouraged again today - Basic metabolic panel  2. B12 deficiency anemia -last b12 level normal 4/15 -previously had been getting shots monthly  3. Essential hypertension, benign -bp at goal with current meds - Basic metabolic panel  4. Primary osteoarthritis of knee, right -right knee flares up here and there--bone on bone -they will call me if they want a steroid injection done  Labs/tests ordered: Orders Placed This Encounter  Procedures  . Basic metabolic panel   Her daughter is checking if she got zostavax done at the pharmacy.    Next appt:  4 mos

## 2013-08-12 LAB — BASIC METABOLIC PANEL
BUN/Creatinine Ratio: 24 (ref 11–26)
BUN: 35 mg/dL — ABNORMAL HIGH (ref 8–27)
CO2: 21 mmol/L (ref 18–29)
Calcium: 9.9 mg/dL (ref 8.7–10.3)
Chloride: 106 mmol/L (ref 97–108)
Creatinine, Ser: 1.43 mg/dL — ABNORMAL HIGH (ref 0.57–1.00)
GFR calc Af Amer: 37 mL/min/{1.73_m2} — ABNORMAL LOW (ref >59)
GFR calc non Af Amer: 32 mL/min/{1.73_m2} — ABNORMAL LOW (ref >59)
Glucose: 88 mg/dL (ref 65–99)
Potassium: 5 mmol/L (ref 3.5–5.2)
Sodium: 141 mmol/L (ref 134–144)

## 2013-08-15 ENCOUNTER — Encounter: Payer: Self-pay | Admitting: *Deleted

## 2013-09-18 ENCOUNTER — Other Ambulatory Visit: Payer: Self-pay | Admitting: Nurse Practitioner

## 2013-11-21 ENCOUNTER — Ambulatory Visit (INDEPENDENT_AMBULATORY_CARE_PROVIDER_SITE_OTHER): Payer: Medicare Other

## 2013-11-21 DIAGNOSIS — Z23 Encounter for immunization: Secondary | ICD-10-CM

## 2013-12-15 ENCOUNTER — Ambulatory Visit: Payer: Medicare Other | Admitting: Internal Medicine

## 2013-12-18 ENCOUNTER — Ambulatory Visit: Payer: Medicare Other | Admitting: Internal Medicine

## 2013-12-20 ENCOUNTER — Other Ambulatory Visit: Payer: Self-pay | Admitting: Nurse Practitioner

## 2014-01-19 ENCOUNTER — Encounter: Payer: Self-pay | Admitting: Internal Medicine

## 2014-01-19 ENCOUNTER — Ambulatory Visit (INDEPENDENT_AMBULATORY_CARE_PROVIDER_SITE_OTHER): Payer: Medicare Other | Admitting: Internal Medicine

## 2014-01-19 ENCOUNTER — Other Ambulatory Visit: Payer: Self-pay | Admitting: *Deleted

## 2014-01-19 VITALS — BP 132/78 | HR 94 | Temp 97.7°F | Resp 20 | Wt 183.0 lb

## 2014-01-19 DIAGNOSIS — I10 Essential (primary) hypertension: Secondary | ICD-10-CM

## 2014-01-19 DIAGNOSIS — L570 Actinic keratosis: Secondary | ICD-10-CM

## 2014-01-19 DIAGNOSIS — E785 Hyperlipidemia, unspecified: Secondary | ICD-10-CM

## 2014-01-19 DIAGNOSIS — M1711 Unilateral primary osteoarthritis, right knee: Secondary | ICD-10-CM

## 2014-01-19 DIAGNOSIS — Z23 Encounter for immunization: Secondary | ICD-10-CM

## 2014-01-19 DIAGNOSIS — D519 Vitamin B12 deficiency anemia, unspecified: Secondary | ICD-10-CM

## 2014-01-19 DIAGNOSIS — N184 Chronic kidney disease, stage 4 (severe): Secondary | ICD-10-CM

## 2014-01-19 DIAGNOSIS — M8580 Other specified disorders of bone density and structure, unspecified site: Secondary | ICD-10-CM

## 2014-01-19 MED ORDER — VITAMIN D3 50 MCG (2000 UT) PO CAPS: 2000.0000 [IU] | ORAL_CAPSULE | Freq: Every day | ORAL | Status: DC

## 2014-01-19 MED ORDER — SIMVASTATIN 20 MG PO TABS: ORAL_TABLET | ORAL | Status: DC

## 2014-01-19 MED ORDER — LISINOPRIL-HYDROCHLOROTHIAZIDE 10-12.5 MG PO TABS: ORAL_TABLET | ORAL | Status: DC

## 2014-01-19 MED ORDER — ZOSTER VACCINE LIVE 19400 UNT/0.65ML ~~LOC~~ SOLR: 0.6500 mL | Freq: Once | SUBCUTANEOUS | Status: DC

## 2014-01-19 NOTE — Telephone Encounter (Signed)
Patient daughter requested refills.

## 2014-01-19 NOTE — Progress Notes (Signed)
Patient ID: Shannon Berg, female   DOB: Jun 21, 1922, 80 y.o.   MRN: [id number]   Location:  [facility] [place] [facility]  Code Status: DNR, has MOST, living will and hcpoa  No Known Allergies  Chief Complaint  Patient presents with  . Medical Management of Chronic Issues    HPI: Patient is a 80 y.o.  White female seen in the office today for med mgt of chronic diseases.  She is doing very well.  Her right knee doesn't even hurt her today.  Asking about zostavax.  Discussed getting it through the pharmacy to go through part D  Up to date on tdap, pneumovax, prevnar and flu shots Had mammo in 3/15 Bone density was 3/15 also Last lipids at goal in 4/15 on zocor  Has a place on her back.  Will return to derm about the actinic keratosis.    Review of Systems:  Review of Systems  Constitutional: Negative for fever and chills.  HENT: Negative for congestion.   Eyes: Negative for blurred vision.  Respiratory: Negative for shortness of breath.   Cardiovascular: Negative for chest pain.  Gastrointestinal: Negative for abdominal pain, constipation, blood in stool and melena.  Genitourinary: Negative for dysuria.  Musculoskeletal: Positive for joint pain. Negative for myalgias and falls.  Skin: Negative for rash.       Area on her lower back is itchy, also has several places on arms   Neurological: Negative for dizziness.  Psychiatric/Behavioral: Negative for memory loss.     Past Medical History  Diagnosis Date  . Other specified disorder of skin   . Unspecified disorder of kidney and ureter   . Senile osteoporosis   . Other nonspecific abnormal serum enzyme levels   . Essential hypertension, benign   . Pain in joint, lower leg   . Other B-complex deficiencies   . Other and unspecified hyperlipidemia   . Reflux esophagitis   . Calculus of kidney   . Dizziness and giddiness   . Edema   . Diverticulosis of colon (without mention of hemorrhage)   .  Hyperlipidemia LDL goal < 100     Past Surgical History  Procedure Laterality Date  . Ganglion cyst excision  1974  . Fracture surgery N/A 1984    wrist  . Excision of adnexal mass  2007    Social History:   reports that she has never smoked. She does not have any smokeless tobacco history on file. She reports that she does not drink alcohol or use illicit drugs.  Family History  Problem Relation Age of Onset  . Cancer Brother     Medications: Patient's Medications  New Prescriptions   No medications on file  Previous Medications   ASPIRIN EC 81 MG TABLET    Take 81 mg by mouth daily.   CALCIUM CARBONATE (OS-CAL) 600 MG TABS TABLET    Take 600 mg by mouth daily.   LISINOPRIL-HYDROCHLOROTHIAZIDE (PRINZIDE,ZESTORETIC) 10-12.5 MG PER TABLET    TAKE ONE TABLET DAILY FOR HIGH BLOOD PRESSURE.   SIMVASTATIN (ZOCOR) 20 MG TABLET    TAKE 1 TABLET ONCE DAILY TO LOWER CHOLESTEROL.  Modified Medications   No medications on file  Discontinued Medications   No medications on file     Physical Exam: Filed Vitals:   01/19/14 1000  BP: 132/78  Pulse: 94  Temp: 97.7 F (36.5 C)  TempSrc: Oral  Resp: 20  Weight: 183 lb (83.008 kg)  SpO2: 95%  Physical Exam  Constitutional: She is oriented to person, place, and time. She appears well-developed and well-nourished. No distress.  Cardiovascular: Normal rate, regular rhythm, normal heart sounds and intact distal pulses.   Pulmonary/Chest: Effort normal and breath sounds normal. No respiratory distress.  Abdominal: Soft. Bowel sounds are normal. She exhibits no distension and no mass. There is no tenderness.  Musculoskeletal: Normal range of motion. She exhibits tenderness.  Right knee and walks with limp, uses cane  Neurological: She is alert and oriented to person, place, and time.  Skin: Skin is warm and dry.  Psychiatric: She has a normal mood and affect.    Labs reviewed: Basic Metabolic Panel:  Recent Labs  05/08/13 0926  08/11/13 1116  NA 145* 141  K 5.1 5.0  CL 109* 106  CO2 19 21  GLUCOSE 97 88  BUN 40* 35*  CREATININE 1.60* 1.43*  CALCIUM 9.9 9.9   Liver Function Tests:  Recent Labs  05/08/13 0926  AST 18  ALT 12  ALKPHOS 73  BILITOT 1.4*  PROT 6.7   No results for input(s): LIPASE, AMYLASE in the last 8760 hours. No results for input(s): AMMONIA in the last 8760 hours. CBC:  Recent Labs  05/08/13 0926  WBC 6.6  NEUTROABS 4.8  HGB 11.3  HCT 34.9  MCV 81   Lipid Panel:  Recent Labs  05/08/13 0926  HDL 54  LDLCALC 82  TRIG 103  CHOLHDL 2.9    Assessment/Plan 1. Actinic keratosis -advised to see dermatology about these on her back; also has "age spots" on her arms that have been getting more prominent  2. Osteopenia - cont ca with D gummies and add daily vitamin D also - Cholecalciferol (VITAMIN D3) 2000 UNITS capsule; Take 1 capsule (2,000 Units total) by mouth daily.  Dispense: 30 capsule; Refill: 3  3. Chronic kidney disease, stage IV (severe) -encouraged hydration -avoid nsaids and other nephrotoxic agents - Comprehensive metabolic panel; Future  4. Essential hypertension, benign -bp at goal with lisinopril/hctz - Comprehensive metabolic panel; Future - CBC With differential/Platelet; Future  5. B12 deficiency anemia -b12 levels have recently been wnl  6. Primary osteoarthritis of knee, right -not hurting her today, uses cane for support -prn tylenol, but generally doesn't use it at all  7. Need for vaccination for zoster - Rx given and list of pharmacies that give vaccinations -zoster vaccine live, PF, (ZOSTAVAX) 24401 UNT/0.65ML injection; Inject 19,400 Units into the skin once.  Dispense: 1 each; Refill: 0  8. Hyperlipidemia - cont zocor 20mg  daily - Lipid panel; Future before annual exam  Labs/tests ordered:   Orders Placed This Encounter  Procedures  . Comprehensive metabolic panel    Standing Status: Future     Number of Occurrences:       Standing Expiration Date: 09/20/2014    Order Specific Question:  Has the patient fasted?    Answer:  Yes  . CBC With differential/Platelet    Standing Status: Future     Number of Occurrences:      Standing Expiration Date: 09/20/2014  . Lipid panel    Standing Status: Future     Number of Occurrences:      Standing Expiration Date: 09/20/2014    Order Specific Question:  Has the patient fasted?    Answer:  Yes    Next appt:  4 mos for annual exam  Lakeyshia Tuckerman L. Walterine Amodei, D.O. [facility] [address],  [place] [address] Cell Phone (Mon-Fri 8am-5pm):  [phone] On Call:  [phone] & follow prompts after 5pm & weekends Office Phone:  [phone] Office Fax:  [phone]

## 2014-05-14 ENCOUNTER — Other Ambulatory Visit: Payer: Medicare Other

## 2014-05-14 DIAGNOSIS — I10 Essential (primary) hypertension: Secondary | ICD-10-CM | POA: Diagnosis not present

## 2014-05-14 DIAGNOSIS — N184 Chronic kidney disease, stage 4 (severe): Secondary | ICD-10-CM | POA: Diagnosis not present

## 2014-05-14 DIAGNOSIS — E785 Hyperlipidemia, unspecified: Secondary | ICD-10-CM

## 2014-05-15 LAB — COMPREHENSIVE METABOLIC PANEL
ALT: 11 IU/L (ref 0–32)
AST: 13 IU/L (ref 0–40)
Albumin/Globulin Ratio: 1.9 (ref 1.1–2.5)
Albumin: 4.5 g/dL (ref 3.5–4.7)
Alkaline Phosphatase: 86 IU/L (ref 39–117)
BUN/Creatinine Ratio: 26 (ref 11–26)
BUN: 49 mg/dL — ABNORMAL HIGH (ref 8–27)
Bilirubin Total: 1.3 mg/dL — ABNORMAL HIGH (ref 0.0–1.2)
CO2: 15 mmol/L — ABNORMAL LOW (ref 18–29)
Calcium: 10.1 mg/dL (ref 8.7–10.3)
Chloride: 109 mmol/L — ABNORMAL HIGH (ref 97–108)
Creatinine, Ser: 1.87 mg/dL — ABNORMAL HIGH (ref 0.57–1.00)
GFR calc Af Amer: 27 mL/min/{1.73_m2} — ABNORMAL LOW (ref >59)
GFR calc non Af Amer: 23 mL/min/{1.73_m2} — ABNORMAL LOW (ref >59)
Globulin, Total: 2.4 g/dL (ref 1.5–4.5)
Glucose: 100 mg/dL — ABNORMAL HIGH (ref 65–99)
Potassium: 5.9 mmol/L — ABNORMAL HIGH (ref 3.5–5.2)
Sodium: 142 mmol/L (ref 134–144)
Total Protein: 6.9 g/dL (ref 6.0–8.5)

## 2014-05-15 LAB — LIPID PANEL
Chol/HDL Ratio: 3.1 ratio units (ref 0.0–4.4)
Cholesterol, Total: 162 mg/dL (ref 100–199)
HDL: 53 mg/dL (ref >39)
LDL Calculated: 86 mg/dL (ref 0–99)
Triglycerides: 117 mg/dL (ref 0–149)
VLDL Cholesterol Cal: 23 mg/dL (ref 5–40)

## 2014-05-15 LAB — CBC WITH DIFFERENTIAL
Basophils Absolute: 0 10*3/uL (ref 0.0–0.2)
Basos: 0 %
Eos: 1 %
Eosinophils Absolute: 0.1 10*3/uL (ref 0.0–0.4)
HCT: 36.9 % (ref 34.0–46.6)
Hemoglobin: 12 g/dL (ref 11.1–15.9)
Immature Grans (Abs): 0 10*3/uL (ref 0.0–0.1)
Immature Granulocytes: 0 %
Lymphocytes Absolute: 1.2 10*3/uL (ref 0.7–3.1)
Lymphs: 14 %
MCH: 26.5 pg — ABNORMAL LOW (ref 26.6–33.0)
MCHC: 32.5 g/dL (ref 31.5–35.7)
MCV: 82 fL (ref 79–97)
Monocytes Absolute: 0.6 10*3/uL (ref 0.1–0.9)
Monocytes: 7 %
Neutrophils Absolute: 6.6 10*3/uL (ref 1.4–7.0)
Neutrophils Relative %: 78 %
RBC: 4.52 x10E6/uL (ref 3.77–5.28)
RDW: 14.7 % (ref 12.3–15.4)
WBC: 8.5 10*3/uL (ref 3.4–10.8)

## 2014-05-18 ENCOUNTER — Ambulatory Visit (INDEPENDENT_AMBULATORY_CARE_PROVIDER_SITE_OTHER): Payer: Medicare Other | Admitting: Internal Medicine

## 2014-05-18 ENCOUNTER — Encounter: Payer: Self-pay | Admitting: Internal Medicine

## 2014-05-18 VITALS — BP 128/78 | HR 87 | Temp 98.0°F | Resp 18 | Ht 63.0 in | Wt 182.6 lb

## 2014-05-18 DIAGNOSIS — Z0000 Encounter for general adult medical examination without abnormal findings: Secondary | ICD-10-CM

## 2014-05-18 DIAGNOSIS — N184 Chronic kidney disease, stage 4 (severe): Secondary | ICD-10-CM

## 2014-05-18 DIAGNOSIS — D519 Vitamin B12 deficiency anemia, unspecified: Secondary | ICD-10-CM

## 2014-05-18 DIAGNOSIS — R413 Other amnesia: Secondary | ICD-10-CM | POA: Diagnosis not present

## 2014-05-18 DIAGNOSIS — M8580 Other specified disorders of bone density and structure, unspecified site: Secondary | ICD-10-CM

## 2014-05-18 DIAGNOSIS — I10 Essential (primary) hypertension: Secondary | ICD-10-CM

## 2014-05-18 DIAGNOSIS — E785 Hyperlipidemia, unspecified: Secondary | ICD-10-CM | POA: Diagnosis not present

## 2014-05-18 NOTE — Progress Notes (Signed)
Did not pass clock test. 

## 2014-05-18 NOTE — Patient Instructions (Signed)
I referred you to a kidney specialist due to your worsening kidney function.    Please come back in one week for labs to recheck your kidneys after you are well hydrated and avoid high potassium foods especially bananas and sweet potatoes.  Please go get your shingles vaccine at pharmacy.

## 2014-05-18 NOTE — Progress Notes (Signed)
Patient ID: Shannon Berg, female   DOB: 04/01/1922, 81 y.o.   MRN: [id number]   Location:  [facility]  Code Status: DNR Goals of Care: Advanced Directive information Does patient have an advance directive?: Yes, Type of Advance Directive: Living will;Healthcare Power of [place];Out of facility DNR (pink MOST or yellow form), Pre-existing out of facility DNR order (yellow form or pink MOST form): Yellow form placed in chart (order not valid for inpatient use), Does patient want to make changes to advanced directive?: No - Patient declined   No Known Allergies  Chief Complaint  Patient presents with  . Annual Exam    HPI: Patient is a 81 y.o. white female seen in the office today for her annual exam.    No previous mmse in epic.  Need to check misys system.  She scored 17/30 today and failed her clock drawing.  Her kidney function has gotten worse and I wonder if that is why her mentation is worse.  She had nothing to drink before her labs for 12 hrs.  She loves bananas which doesn't help her renal function.  No nsaids--uses tylenol for her right knee pain.  MMSE - Mini Mental State Exam 05/18/2014  Orientation to time 3  Orientation to Place 3  Registration 3  Attention/ Calculation 1  Recall 0  Language- name 2 objects 2  Language- repeat 1  Language- follow 3 step command 3  Language- read & follow direction 1  Write a sentence 0  Copy design 0  Total score 17   She's had no falls.   She is not depressed. Primarily walks for physical activity--a lot of shopping.   Won't go to exercise classes  Has some dyspnea on exertion.    Breast exam today.  Has had all vaccines except zoster at pharmacy.  Review of Systems:  Review of Systems  Constitutional: Negative for fever, chills and weight loss.  HENT: Negative for congestion and hearing loss.   Eyes: Negative for blurred vision.  Respiratory: Positive for shortness of breath.  Negative for cough.   Cardiovascular: Negative for chest pain and leg swelling.  Gastrointestinal: Positive for constipation. Negative for abdominal pain, blood in stool and melena.  Genitourinary: Negative for dysuria.  Musculoskeletal: Positive for joint pain.       Right knee  Skin: Negative for rash.       Multiple actinic and seborrheic keratoses present  Neurological: Negative for dizziness, weakness and headaches.  Endo/Heme/Allergies: Bruises/bleeds easily.  Psychiatric/Behavioral: Positive for memory loss. Negative for depression. The patient is nervous/anxious.      Past Medical History  Diagnosis Date  . Other specified disorder of skin   . Unspecified disorder of kidney and ureter   . Senile osteoporosis   . Other nonspecific abnormal serum enzyme levels   . Essential hypertension, benign   . Pain in joint, lower leg   . Other B-complex deficiencies   . Other and unspecified hyperlipidemia   . Reflux esophagitis   . Calculus of kidney   . Dizziness and giddiness   . Edema   . Diverticulosis of colon (without mention of hemorrhage)   . Hyperlipidemia LDL goal < 100     Past Surgical History  Procedure Laterality Date  . Ganglion cyst excision  1974  . Fracture surgery N/A 1984    wrist  . Excision of adnexal mass  2007    Social History:   reports that  she has never smoked. She does not have any smokeless tobacco history on file. She reports that she does not drink alcohol or use illicit drugs.  Family History  Problem Relation Age of Onset  . Cancer Brother     Medications: Patient's Medications  New Prescriptions   No medications on file  Previous Medications   ASPIRIN EC 81 MG TABLET    Take 81 mg by mouth daily.   CALCIUM CARBONATE (OS-CAL) 600 MG TABS TABLET    Take 600 mg by mouth daily.   CHOLECALCIFEROL (VITAMIN D3) 2000 UNITS CAPSULE    Take 1 capsule (2,000 Units total) by mouth daily.   LISINOPRIL-HYDROCHLOROTHIAZIDE (PRINZIDE,ZESTORETIC)  10-12.5 MG PER TABLET    Take one tablet by mouth once daily for high blood pressure   SIMVASTATIN (ZOCOR) 20 MG TABLET    Take one tablet by mouth once daily to lower cholesterol   ZOSTER VACCINE LIVE, PF, (ZOSTAVAX) 60109 UNT/0.65ML INJECTION    Inject 19,400 Units into the skin once.  Modified Medications   No medications on file  Discontinued Medications   No medications on file   Physical Exam: Filed Vitals:   05/18/14 0920  BP: 128/78  Pulse: 87  Temp: 98 F (36.7 C)  TempSrc: Oral  Resp: 18  Height: 5\' 3"  (1.6 m)  Weight: 182 lb 9.6 oz (82.827 kg)  SpO2: 95%  Physical Exam  Constitutional: She is oriented to person, place, and time. She appears well-developed and well-nourished. No distress.  HENT:  Head: Normocephalic and atraumatic.  Right Ear: External ear normal.  Left Ear: External ear normal.  Nose: Nose normal.  Mouth/Throat: Oropharynx is clear and moist. No oropharyngeal exudate.  Small amt cerumen in left ear  Eyes: Conjunctivae and EOM are normal. Pupils are equal, round, and reactive to light.  Neck: Normal range of motion. Neck supple. No JVD present. No thyromegaly present.  Cardiovascular: Normal rate, regular rhythm, normal heart sounds and intact distal pulses.   Pulmonary/Chest: Effort normal and breath sounds normal. No respiratory distress.  Abdominal: Soft. Bowel sounds are normal. She exhibits no distension and no mass. There is no tenderness.  Musculoskeletal: Normal range of motion. She exhibits edema and tenderness.  Of right knee  Lymphadenopathy:    She has no cervical adenopathy.  Neurological: She is alert and oriented to person, place, and time. No cranial nerve deficit.  See MMSE above  Skin: Skin is warm and dry.  Psychiatric: She has a normal mood and affect. Her behavior is normal. Judgment and thought content normal.  A little bit anxious doing mmse today     Labs reviewed: Basic Metabolic Panel:  Recent Labs  08/11/13 1116  05/14/14 0910  NA 141 142  K 5.0 5.9*  CL 106 109*  CO2 21 15*  GLUCOSE 88 100*  BUN 35* 49*  CREATININE 1.43* 1.87*  CALCIUM 9.9 10.1   Liver Function Tests:  Recent Labs  05/14/14 0910  AST 13  ALT 11  ALKPHOS 86  BILITOT 1.3*  PROT 6.9   No results for input(s): LIPASE, AMYLASE in the last 8760 hours. No results for input(s): AMMONIA in the last 8760 hours. CBC:  Recent Labs  05/14/14 0910  WBC 8.5  NEUTROABS 6.6  HGB 12.0  HCT 36.9  MCV 82   Lipid Panel:  Recent Labs  05/14/14 0910  CHOL 162  HDL 53  LDLCALC 86  TRIG 117  CHOLHDL 3.1   No results found for:  HGBA1C    Assessment/Plan 1. Routine general medical examination at a health care facility -see hpi -only outstanding, age-appropriate item is her zostavax which her dtr plans to take her very soon  2. Memory loss -I may put her on namenda if her cognition is not improved at next visit -I think a good part of her memory loss was anxiety today and some could also be due to her declining renal function -encouraged hydration and f/u labs -no other s/s of infection or acute cause  3. Chronic kidney disease, stage IV (severe) -has worsened since last check with some hyperkalemia -encouraged hydration -continue to avoid nsaids (not taking) -will recheck bmp in about 1 wk after avoiding high potassium foods - Ambulatory referral to Nephrology to determine if ace/hctz should be continued or alternative recommended, any other supplements recommended?  Other ideas to help protect from further decline - Basic metabolic panel; Future  4. Essential hypertension, benign -bp at goal, no changes made to regimen today  5. Hyperlipidemia -cont zocor, lipids were just assessed and at goal with this  6. Osteopenia -cont ca and vitamin D, walking for exercise  7. B12 deficiency anemia -b12 levels had normalized and she is no longer on this  Labs/tests ordered:  Bmp in 1 wk, renal consult Next appt:   3 mos  Odetta Forness L. Vasti Yagi, D.O. [facility] [address], [place] [address] Cell Phone (Mon-Fri 8am-5pm):  [phone] On Call:  [phone] & follow prompts after 5pm & weekends Office Phone:  [phone] Office Fax:  [phone]

## 2014-05-29 ENCOUNTER — Other Ambulatory Visit: Payer: Medicare Other

## 2014-05-29 DIAGNOSIS — N184 Chronic kidney disease, stage 4 (severe): Secondary | ICD-10-CM

## 2014-05-30 LAB — BASIC METABOLIC PANEL
BUN/Creatinine Ratio: 23 (ref 11–26)
BUN: 35 mg/dL — ABNORMAL HIGH (ref 8–27)
CO2: 18 mmol/L (ref 18–29)
Calcium: 9.5 mg/dL (ref 8.7–10.3)
Chloride: 105 mmol/L (ref 97–108)
Creatinine, Ser: 1.53 mg/dL — ABNORMAL HIGH (ref 0.57–1.00)
GFR calc Af Amer: 35 mL/min/{1.73_m2} — ABNORMAL LOW (ref >59)
GFR calc non Af Amer: 30 mL/min/{1.73_m2} — ABNORMAL LOW (ref >59)
Glucose: 95 mg/dL (ref 65–99)
Potassium: 4.9 mmol/L (ref 3.5–5.2)
Sodium: 141 mmol/L (ref 134–144)

## 2014-06-22 DIAGNOSIS — H35342 Macular cyst, hole, or pseudohole, left eye: Secondary | ICD-10-CM | POA: Diagnosis not present

## 2014-06-22 DIAGNOSIS — Z961 Presence of intraocular lens: Secondary | ICD-10-CM | POA: Diagnosis not present

## 2014-08-17 ENCOUNTER — Ambulatory Visit (INDEPENDENT_AMBULATORY_CARE_PROVIDER_SITE_OTHER): Payer: Medicare Other | Admitting: Internal Medicine

## 2014-08-17 ENCOUNTER — Encounter: Payer: Self-pay | Admitting: Internal Medicine

## 2014-08-17 VITALS — BP 112/78 | HR 77 | Temp 97.8°F | Resp 20 | Ht 63.0 in | Wt 181.6 lb

## 2014-08-17 DIAGNOSIS — N184 Chronic kidney disease, stage 4 (severe): Secondary | ICD-10-CM

## 2014-08-17 DIAGNOSIS — I10 Essential (primary) hypertension: Secondary | ICD-10-CM | POA: Diagnosis not present

## 2014-08-17 DIAGNOSIS — Z23 Encounter for immunization: Secondary | ICD-10-CM

## 2014-08-17 DIAGNOSIS — E785 Hyperlipidemia, unspecified: Secondary | ICD-10-CM

## 2014-08-17 DIAGNOSIS — M1711 Unilateral primary osteoarthritis, right knee: Secondary | ICD-10-CM

## 2014-08-17 DIAGNOSIS — M8580 Other specified disorders of bone density and structure, unspecified site: Secondary | ICD-10-CM | POA: Diagnosis not present

## 2014-08-17 MED ORDER — ZOSTER VACCINE LIVE 19400 UNT/0.65ML ~~LOC~~ SOLR: 0.6500 mL | Freq: Once | SUBCUTANEOUS | Status: DC

## 2014-08-17 NOTE — Progress Notes (Signed)
Patient ID: Shannon Berg, female   DOB: May 19, 1922, 81 y.o.   MRN: [id number]   Location:  [facility]  Code Status: DNR Goals of Care: Advanced Directive information Does patient have an advance directive?: Yes, Type of Advance Directive: Out of facility DNR (pink MOST or yellow form), Pre-existing out of facility DNR order (yellow form or pink MOST form): Yellow form placed in chart (order not valid for inpatient use), Does patient want to make changes to advanced directive?: No - Patient declined   Chief Complaint  Patient presents with  . Medical Management of Chronic Issues    3 month foolow-up    HPI: Patient is a 81 y.o. white female seen in the office today for med mgt of chronic diseases.    Had some dental work done.  Eating and drinking well.  Lost just 2 lbs.  Her daughter figures she[place] back at the beach.    HTN:  Blood pressure well controlled on her lisinopril/hctz.  Trying to drink enough water to prevent dehydration.    CKD:  Last check in April--kidneys improved and potassium normal.  Has appt with [facility] this Thursday.    OA knees:  Has to rest some when she walks a while, and if she sits a lot, they get sore and stiff.  Does have a long hall to walk in at home.    Hyperlipidemia has been well controlled as of April labs on zocor.   Is in need of her zoster vaccine.  Her daughter plans to take her   Osteopenia:  Is taking her calcium and additional vitamin D.  No falls.    Review of Systems:  Review of Systems  Constitutional: Negative for fever and malaise/fatigue.  HENT: Positive for hearing loss. Negative for congestion.   Eyes: Negative for blurred vision.  Respiratory: Negative for shortness of breath.   Cardiovascular: Positive for leg swelling. Negative for chest pain.       Some days, elevates feet and resolves  Gastrointestinal: Negative for abdominal pain, constipation, blood in stool  and melena.  Genitourinary: Negative for dysuria, urgency and frequency.  Musculoskeletal: Positive for joint pain. Negative for falls.  Skin: Negative for itching and rash.  Neurological: Negative for dizziness, loss of consciousness, weakness and headaches.  Psychiatric/Behavioral: Positive for memory loss. Negative for depression. The patient is not nervous/anxious and does not have insomnia.     Past Medical History  Diagnosis Date  . Other specified disorder of skin   . Unspecified disorder of kidney and ureter   . Senile osteoporosis   . Other nonspecific abnormal serum enzyme levels   . Essential hypertension, benign   . Pain in joint, lower leg   . Other B-complex deficiencies   . Other and unspecified hyperlipidemia   . Reflux esophagitis   . Calculus of kidney   . Dizziness and giddiness   . Edema   . Diverticulosis of colon (without mention of hemorrhage)   . Hyperlipidemia LDL goal < 100     Past Surgical History  Procedure Laterality Date  . Ganglion cyst excision  1974  . Fracture surgery N/A 1984    wrist  . Excision of adnexal mass  2007    No Known Allergies Medications: Patient's Medications  New Prescriptions   No medications on file  Previous Medications   ASPIRIN EC 81 MG TABLET    Take 81 mg by mouth daily.  CALCIUM CARBONATE (OS-CAL) 600 MG TABS TABLET    Take 600 mg by mouth daily.   CHOLECALCIFEROL (VITAMIN D3) 2000 UNITS CAPSULE    Take 1 capsule (2,000 Units total) by mouth daily.   LISINOPRIL-HYDROCHLOROTHIAZIDE (PRINZIDE,ZESTORETIC) 10-12.5 MG PER TABLET    Take one tablet by mouth once daily for high blood pressure   SIMVASTATIN (ZOCOR) 20 MG TABLET    Take one tablet by mouth once daily to lower cholesterol  Modified Medications   Modified Medication Previous Medication   ZOSTER VACCINE LIVE, PF, (ZOSTAVAX) 31540 UNT/0.65ML INJECTION zoster vaccine live, PF, (ZOSTAVAX) 08676 UNT/0.65ML injection      Inject 19,400 Units into the skin  once.    Inject 19,400 Units into the skin once.  Discontinued Medications   No medications on file    Physical Exam: Filed Vitals:   08/17/14 1007  BP: 112/78  Pulse: 77  Temp: 97.8 F (36.6 C)  TempSrc: Oral  Resp: 20  Height: 5\' 3"  (1.6 m)  Weight: 181 lb 9.6 oz (82.373 kg)  SpO2: 95%   Physical Exam  Constitutional: She is oriented to person, place, and time. She appears well-developed and well-nourished. No distress.  Cardiovascular: Normal rate, regular rhythm, normal heart sounds and intact distal pulses.   Pulmonary/Chest: Effort normal and breath sounds normal. No respiratory distress. She has no rales.  Abdominal: Soft. Bowel sounds are normal. She exhibits no distension and no mass. There is no tenderness.  Musculoskeletal: Normal range of motion. She exhibits tenderness.  Right knee, walks with cane  Neurological: She is alert and oriented to person, place, and time.  Skin: Skin is warm and dry.  Psychiatric: She has a normal mood and affect. Her behavior is normal. Judgment and thought content normal.    Labs reviewed: Basic Metabolic Panel:  Recent Labs  05/14/14 0910 05/29/14 0925  NA 142 141  K 5.9* 4.9  CL 109* 105  CO2 15* 18  GLUCOSE 100* 95  BUN 49* 35*  CREATININE 1.87* 1.53*  CALCIUM 10.1 9.5   Liver Function Tests:  Recent Labs  05/14/14 0910  AST 13  ALT 11  ALKPHOS 86  BILITOT 1.3*  PROT 6.9   No results for input(s): LIPASE, AMYLASE in the last 8760 hours. No results for input(s): AMMONIA in the last 8760 hours. CBC:  Recent Labs  05/14/14 0910  WBC 8.5  NEUTROABS 6.6  HGB 12.0  HCT 36.9  MCV 82   Lipid Panel:  Recent Labs  05/14/14 0910  CHOL 162  HDL 53  LDLCALC 86  TRIG 117  CHOLHDL 3.1   Assessment/Plan 1. Essential hypertension, benign -bp at goal, cont zestoretic  2. Chronic kidney disease, stage IV (severe) -had worsened on labs last appt so referred to nephrology for a one time consultation--I can  manage as per their recommendations--now has appt coming up -avoid nsaids -hydrate with water  3. Hyperlipidemia -cont zocor 20mg  for this, lipids at goal for her age  53. Primary osteoarthritis of knee, right -cont cane use, walking regularly, tylenol as needed for pain, no nsaids  5. Osteopenia -cont ca and vitamin D and weightbearing exercise--last bone density was 3/15  6. Need for vaccination for zoster -needs Rx for zostavax b/c other one expired--pt still needs to get this done at the pharmacy - zoster vaccine live, PF, (ZOSTAVAX) 19509 UNT/0.65ML injection; Inject 19,400 Units into the skin once.  Dispense: 1 each; Refill: 0  Labs/tests ordered:  No new labs b/c  they will be getting some at [facility] later in the week  Next appt:  4 mos for med mgt  Marquell Saenz L. Nil Xiong, D.O. [facility] [address], [place] [address] Cell Phone (Mon-Fri 8am-5pm):  [phone] On Call:  [phone] & follow prompts after 5pm & weekends Office Phone:  [phone] Office Fax:  [phone]

## 2014-08-20 DIAGNOSIS — R7989 Other specified abnormal findings of blood chemistry: Secondary | ICD-10-CM | POA: Diagnosis not present

## 2014-08-20 DIAGNOSIS — N183 Chronic kidney disease, stage 3 (moderate): Secondary | ICD-10-CM | POA: Diagnosis not present

## 2014-08-20 DIAGNOSIS — N2581 Secondary hyperparathyroidism of renal origin: Secondary | ICD-10-CM | POA: Diagnosis not present

## 2014-08-24 ENCOUNTER — Other Ambulatory Visit: Payer: Self-pay | Admitting: Nephrology

## 2014-08-24 DIAGNOSIS — N183 Chronic kidney disease, stage 3 unspecified: Secondary | ICD-10-CM

## 2014-08-28 ENCOUNTER — Other Ambulatory Visit: Payer: Self-pay

## 2014-08-31 ENCOUNTER — Ambulatory Visit
Admission: RE | Admit: 2014-08-31 | Discharge: 2014-08-31 | Disposition: A | Discharge status: Home / Self Care | Payer: Medicare Other | Source: Ambulatory Visit | Attending: Nephrology | Admitting: Nephrology

## 2014-08-31 DIAGNOSIS — N183 Chronic kidney disease, stage 3 unspecified: Secondary | ICD-10-CM

## 2014-08-31 DIAGNOSIS — N200 Calculus of kidney: Secondary | ICD-10-CM | POA: Diagnosis not present

## 2014-08-31 DIAGNOSIS — N281 Cyst of kidney, acquired: Secondary | ICD-10-CM | POA: Diagnosis not present

## 2014-09-14 ENCOUNTER — Other Ambulatory Visit: Payer: Self-pay | Admitting: Internal Medicine

## 2014-12-14 ENCOUNTER — Other Ambulatory Visit: Payer: Self-pay | Admitting: Internal Medicine

## 2014-12-14 ENCOUNTER — Ambulatory Visit (INDEPENDENT_AMBULATORY_CARE_PROVIDER_SITE_OTHER): Payer: Medicare Other | Admitting: Internal Medicine

## 2014-12-14 ENCOUNTER — Telehealth: Payer: Self-pay | Admitting: Internal Medicine

## 2014-12-14 ENCOUNTER — Encounter: Payer: Self-pay | Admitting: Internal Medicine

## 2014-12-14 VITALS — BP 110/74 | HR 77 | Temp 97.5°F | Resp 20 | Ht 63.0 in | Wt 175.4 lb

## 2014-12-14 DIAGNOSIS — E785 Hyperlipidemia, unspecified: Secondary | ICD-10-CM | POA: Diagnosis not present

## 2014-12-14 DIAGNOSIS — I10 Essential (primary) hypertension: Secondary | ICD-10-CM

## 2014-12-14 DIAGNOSIS — H8110 Benign paroxysmal vertigo, unspecified ear: Secondary | ICD-10-CM | POA: Diagnosis not present

## 2014-12-14 DIAGNOSIS — M8580 Other specified disorders of bone density and structure, unspecified site: Secondary | ICD-10-CM

## 2014-12-14 DIAGNOSIS — A084 Viral intestinal infection, unspecified: Secondary | ICD-10-CM

## 2014-12-14 DIAGNOSIS — N184 Chronic kidney disease, stage 4 (severe): Secondary | ICD-10-CM

## 2014-12-14 MED ORDER — VITAMIN D3 50 MCG (2000 UT) PO CAPS: 2000.0000 [IU] | ORAL_CAPSULE | Freq: Every day | ORAL | Status: AC

## 2014-12-14 MED ORDER — MECLIZINE HCL 12.5 MG PO TABS: 12.5000 mg | ORAL_TABLET | Freq: Three times a day (TID) | ORAL | Status: DC | PRN

## 2014-12-14 MED ORDER — SIMVASTATIN 20 MG PO TABS
ORAL_TABLET | ORAL | Status: DC
Start: 2014-12-14 — End: 2015-05-18

## 2014-12-14 MED ORDER — LISINOPRIL-HYDROCHLOROTHIAZIDE 10-12.5 MG PO TABS: ORAL_TABLET | ORAL | Status: DC

## 2014-12-14 NOTE — Telephone Encounter (Signed)
Spoke with patients daughter, regarding changing PCP to Dr. Hollace Kinnier.  She is presently working with [employer].  cdavis

## 2014-12-14 NOTE — Progress Notes (Signed)
Patient ID: Shannon Berg, female   DOB: 12-09-1922, 81 y.o.   MRN: [id number]  Location:  [facility] Provider:  Jonelle Sidle L. Mariea Clonts, D.O., C.M.D. Hollace Kinnier, DO  Code Status:  DNR Goals of care: Advanced Directive information Does patient have an advance directive?: Yes, Type of Advance Directive: [place], Does patient want to make changes to advanced directive?: No - Patient declined  Chief Complaint  Patient presents with  . Medical Management of Chronic Issues    HPI:  Pt is a 81 y.o. white female seen today for medical management of chronic diseases.  Pt had been sick for several days and after that, she was having some really bad swimmyheadedness.  Has had to sit on the side of the bed for a while when she gets up.  Furniture was moved around.  Not taking medication for vertigo.  Sickness:  She was nauseous, had dry heaves, vomited a lot.  Had to run to the bathroom and use the pan she soaks her feet in to vomit.  A little diarrhea.  Nothing major there.  Did not eat for several days.  Kept a little fluid in her.  No clear sick contacts, but she's at [facility].  Sick on a Monday--her daughter stayed with her mon and tues night.  Got better a little and able to start to eat on wed.  Was now two weeks ago.  Feet even less swollen.  Appetite still not at baseline.   Drank cranberry juice, water, a little chicken broth later on.   Her daughter had her hold her blood pressure pill--[facility] [place] pharmacist recommended that.  Still dizzy when goes to get up.  Also comes on when almost through lying down.  She's now sleeping on two small pillows.  That helped some.  It's a whole lot better now.  Sometimes can get up ok now but taking it easy.    Her grandson is getting married in December.    Some knee osteoathritis.  Knows limitations.  Got tired shopping this weekend.    Using rollator walker a little more b/c she can sit in a hurry.  Has pink with zebra print on it.    Has f/u  with Dr. Justin Mend on Wed.  I will allow him to reassess her renal function.  Had to do a urine sample for him.  Has dental appt tomorrow.  Review of Systems  Constitutional: Negative for fever, chills and malaise/fatigue.  HENT: Negative for congestion and hearing loss.   Eyes: Negative for blurred vision.  Respiratory: Negative for shortness of breath.   Cardiovascular: Negative for chest pain and leg swelling.  Gastrointestinal: Positive for nausea, vomiting and diarrhea. Negative for heartburn, abdominal pain, constipation, blood in stool and melena.  Genitourinary: Negative for dysuria.  Musculoskeletal: Negative for falls.  Skin: Negative for rash.  Neurological: Positive for dizziness. Negative for loss of consciousness, weakness and headaches.  Endo/Heme/Allergies: Does not bruise/bleed easily.  Psychiatric/Behavioral: Negative for depression and memory loss.    Past Medical History  Diagnosis Date  . Other specified disorder of skin   . Unspecified disorder of kidney and ureter   . Senile osteoporosis   . Other nonspecific abnormal serum enzyme levels   . Essential hypertension, benign   . Pain in joint, lower leg   . Other B-complex deficiencies   . Other and unspecified hyperlipidemia   . Reflux esophagitis   . Calculus of kidney   . Dizziness and giddiness   .  Edema   . Diverticulosis of colon (without mention of hemorrhage)   . Hyperlipidemia LDL goal < 100    Past Surgical History  Procedure Laterality Date  . Ganglion cyst excision  1974  . Fracture surgery N/A 1984    wrist  . Excision of adnexal mass  2007    No Known Allergies    Medication List       This list is accurate as of: 12/14/14 10:51 AM.  Always use your most recent med list.               aspirin EC 81 MG tablet  Take 81 mg by mouth daily.     calcium carbonate 600 MG Tabs tablet  Commonly known as:  OS-CAL  Take 600 mg by mouth daily.     lisinopril-hydrochlorothiazide 10-12.5 MG  tablet  Commonly known as:  PRINZIDE,ZESTORETIC  TAKE ONE TABLET DAILY FOR HIGH BLOOD PRESSURE.     simvastatin 20 MG tablet  Commonly known as:  ZOCOR  TAKE 1 TABLET ONCE DAILY TO LOWER CHOLESTEROL.     Vitamin D3 2000 UNITS capsule  Take 1 capsule (2,000 Units total) by mouth daily.     zoster vaccine live (PF) 19400 UNT/0.65ML injection  Commonly known as:  ZOSTAVAX  Inject 19,400 Units into the skin once.        Immunization History  Administered Date(s) Administered  . Influenza,inj,Quad PF,36+ Mos 11/07/2012, 11/21/2013  . Influenza-Unspecified 11/04/2009, 11/14/2010, 10/23/2011, 11/12/2014  . Pneumococcal Conjugate-13 05/12/2013  . Pneumococcal Polysaccharide-23 02/06/2006  . Td 02/06/2006   Pertinent  Health Maintenance Due  Topic Date Due  . INFLUENZA VACCINE  09/07/2015  . DEXA SCAN  Completed  . PNA vac Low Risk Adult  Completed   Fall Risk  12/14/2014 05/18/2014 01/19/2014 05/12/2013  Falls in the past year? No No No No  Risk for fall due to : - - - Medication side effect  Risk for fall due to (comments): - - - on diuretic    Filed Vitals:   12/14/14 1041  BP: 110/74  Pulse: 77  Temp: 97.5 F (36.4 C)  TempSrc: Oral  Resp: 20  Height: 5\' 3"  (1.6 m)  Weight: 175 lb 6.4 oz (79.561 kg)  SpO2: 96%   Body mass index is 31.08 kg/(m^2). Physical Exam  Constitutional: She is oriented to person, place, and time. She appears well-developed and well-nourished. No distress.  Cardiovascular: Normal rate, regular rhythm, normal heart sounds and intact distal pulses.   Pulmonary/Chest: Effort normal and breath sounds normal. No respiratory distress.  Abdominal: Soft. Bowel sounds are normal. She exhibits no distension. There is no tenderness.  Musculoskeletal: Normal range of motion. She exhibits no tenderness.  Neurological: She is alert and oriented to person, place, and time.  Skin: Skin is warm and dry.  Psychiatric: She has a normal mood and affect. Her  behavior is normal. Judgment and thought content normal.    Labs reviewed:  Recent Labs  05/14/14 0910 05/29/14 0925  NA 142 141  K 5.9* 4.9  CL 109* 105  CO2 15* 18  GLUCOSE 100* 95  BUN 49* 35*  CREATININE 1.87* 1.53*  CALCIUM 10.1 9.5    Recent Labs  05/14/14 0910  AST 13  ALT 11  ALKPHOS 86  BILITOT 1.3*  PROT 6.9  ALBUMIN 4.5    Recent Labs  05/14/14 0910  WBC 8.5  NEUTROABS 6.6  HGB 12.0  HCT 36.9  MCV 82  No results found for: TSH No results found for: HGBA1C Lab Results  Component Value Date   CHOL 162 05/14/2014   HDL 53 05/14/2014   LDLCALC 86 05/14/2014   TRIG 117 05/14/2014   CHOLHDL 3.1 05/14/2014    Assessment/Plan 1. BPPV (benign paroxysmal positional vertigo), unspecified laterality -has had this before; she is in the midst of a new episode that is gradually improving since she had a viral gastroenteritis -given meclizine to use as needed for severe episodes--if this does not resolve, would send her for more PT for the vertigo which helped in the past  2. Viral gastroenteritis -has resolved and pt's strength and intake is gradually improving-has renal f/u this afternoon for which she had to do a urine sample and expect she'll have a f/u bmp at least there (did not take blood here today to avoid double labs for her in one day)  3. Osteopenia - cont vitamin D supplement and weightbearing exercise - Cholecalciferol (VITAMIN D3) 2000 UNITS capsule; Take 1 capsule (2,000 Units total) by mouth daily.  Dispense: 30 capsule; Refill: 3  4. Essential hypertension, benign -bp at goal with current therapy, no changes -she did hold her ace/hctz when she was sick fortunately per pharmacist's direction--is back on it now  5. Chronic kidney disease, stage IV (severe) ([place]) -f/u with Dr. Justin Mend this afternoon  6. Hyperlipidemia -lipids at goal with statin and not having complications of the statin  Handicapped plaquard renewal done  Family/  staff Communication: discussed with her daughter today and will copy Dr. Justin Mend on my note today  Labs/tests ordered:  No new at present [place]. Kori Colin, D.O. [facility] [address], [place] [address] Cell Phone (Mon-Fri 8am-5pm):  [phone] On Call:  [phone] & follow prompts after 5pm & weekends Office Phone:  [phone] Office Fax:  [phone]

## 2014-12-17 DIAGNOSIS — N2581 Secondary hyperparathyroidism of renal origin: Secondary | ICD-10-CM | POA: Diagnosis not present

## 2014-12-17 DIAGNOSIS — R7989 Other specified abnormal findings of blood chemistry: Secondary | ICD-10-CM | POA: Diagnosis not present

## 2014-12-17 DIAGNOSIS — N183 Chronic kidney disease, stage 3 (moderate): Secondary | ICD-10-CM | POA: Diagnosis not present

## 2015-01-19 DIAGNOSIS — N2581 Secondary hyperparathyroidism of renal origin: Secondary | ICD-10-CM | POA: Diagnosis not present

## 2015-01-29 DIAGNOSIS — N183 Chronic kidney disease, stage 3 (moderate): Secondary | ICD-10-CM | POA: Diagnosis not present

## 2015-03-09 DIAGNOSIS — N183 Chronic kidney disease, stage 3 (moderate): Secondary | ICD-10-CM | POA: Diagnosis not present

## 2015-04-02 ENCOUNTER — Telehealth: Payer: Self-pay

## 2015-04-02 DIAGNOSIS — M7989 Other specified soft tissue disorders: Secondary | ICD-10-CM

## 2015-04-02 NOTE — Telephone Encounter (Signed)
Left hand swelling x 1 month in the knuckle area. Patient is unable to wear rings due to swelling. Patient denies injury to hand. Patient denies any other symptoms. Patient was told to d/c lisinopril/HCTZ by Dr.Webb (kidney specialist) (FYI)- removed from medication list  Patient unable to be seen on Monday (no appointment's available), patient was offered to see Janett Billow on Tuesday at 9:00 am and patient's daughter is unable to get her here that early. Please advise  Pharmacy on file confirmed

## 2015-04-03 NOTE — Telephone Encounter (Signed)
Suspect this is due to arthritis.  Is she left handed?  I will need to see her to evaluate that.  In the meantime, let's order an xray of her left hand due to swelling.  She can walk in at [facility] for that and then schedule her for Thursday with me.

## 2015-04-05 NOTE — Telephone Encounter (Signed)
Message left on Voicemail for Mrs.Jones informing her of Dr.Reed's instructions. Order placed for xray. Patient to call and schedule appointment for Thursday

## 2015-04-08 ENCOUNTER — Telehealth: Payer: Self-pay | Admitting: *Deleted

## 2015-04-08 ENCOUNTER — Other Ambulatory Visit: Payer: Self-pay | Admitting: *Deleted

## 2015-04-08 ENCOUNTER — Other Ambulatory Visit: Payer: Self-pay | Admitting: Internal Medicine

## 2015-04-08 ENCOUNTER — Ambulatory Visit
Admission: RE | Admit: 2015-04-08 | Discharge: 2015-04-08 | Disposition: A | Discharge status: Home / Self Care | Payer: Medicare Other | Source: Ambulatory Visit | Attending: Internal Medicine | Admitting: Internal Medicine

## 2015-04-08 DIAGNOSIS — M79641 Pain in right hand: Secondary | ICD-10-CM | POA: Diagnosis not present

## 2015-04-08 DIAGNOSIS — M7989 Other specified soft tissue disorders: Secondary | ICD-10-CM

## 2015-04-08 NOTE — Telephone Encounter (Signed)
Appointment scheduled for Monday to discuss X-Ray

## 2015-04-08 NOTE — Telephone Encounter (Signed)
Ok, make sure she gets an appt (check with dtr first) after she has the [employer]

## 2015-04-08 NOTE — Telephone Encounter (Signed)
Patient daughter, Shannon Berg called and stated that she has to cancel tomorrow's appointment for Left Hand Swelling. Daughter stated that the time is too early in the morning for patient. Also had an appointment scheduled for Monday at 10:30 that she canceled due to her having appointments. Daughter is taking patient today to have the Hand X-ray done. Please Advise.

## 2015-04-09 ENCOUNTER — Ambulatory Visit: Payer: Self-pay | Admitting: Internal Medicine

## 2015-04-12 ENCOUNTER — Ambulatory Visit (INDEPENDENT_AMBULATORY_CARE_PROVIDER_SITE_OTHER): Payer: Medicare Other | Admitting: Internal Medicine

## 2015-04-12 ENCOUNTER — Ambulatory Visit: Payer: Medicare Other | Admitting: Internal Medicine

## 2015-04-12 ENCOUNTER — Encounter: Payer: Self-pay | Admitting: Internal Medicine

## 2015-04-12 VITALS — BP 120/70 | HR 75 | Temp 98.3°F | Wt 173.0 lb

## 2015-04-12 DIAGNOSIS — M1712 Unilateral primary osteoarthritis, left knee: Secondary | ICD-10-CM | POA: Diagnosis not present

## 2015-04-12 DIAGNOSIS — M19041 Primary osteoarthritis, right hand: Secondary | ICD-10-CM | POA: Diagnosis not present

## 2015-04-12 DIAGNOSIS — N2581 Secondary hyperparathyroidism of renal origin: Secondary | ICD-10-CM

## 2015-04-12 DIAGNOSIS — N184 Chronic kidney disease, stage 4 (severe): Secondary | ICD-10-CM

## 2015-04-12 MED ORDER — DICLOFENAC SODIUM 1 % TD GEL: 2.0000 g | Freq: Four times a day (QID) | TRANSDERMAL | Status: DC

## 2015-04-12 NOTE — Progress Notes (Signed)
Patient ID: Shannon Berg, female   DOB: [birth year], 82 y.o.   MRN: [id number]   Location:  [facility] clinic Provider:  Aliceson Dolbow L. Mariea Clonts, D.O., C.M.D.  Code Status: DNR Goals of Care:  Advanced Directives 12/14/2014  Does patient have an advance directive? Yes  Type of Advance Directive [place]  Does patient want to make changes to advanced directive? No - Patient declined  Copy of advanced directive(s) in chart? Yes  Pre-existing out of facility DNR order (yellow form or pink MOST form) -     Chief Complaint  Patient presents with  . discuss hand xray    HPI: Patient is a 82 y.o. female seen today for acute to review xrays of right hand.  Has been using tylenol (just taking one three times a day).  Feels better with this.  Cannot get her rings on.  Right ankle swells also.    Following with Dr. Justin Mend.  Off lisinopril and calcium.  Lisinopril due to hyperkalemia and calcium due to hypercalcemia with secondary hyperparathyroidism.    Getting more difficulty getting up when using restroom.  Sounds like she needs additional grab bars to help getting up in her restroom.    Left knee pain also worsening over time.  Wants to see Dr. Gladstone Lighter.  Will put voltaren on there also and using tylenol.  He had been putting shots in there.  It's worse this year.  Weather changes affect it.  Worse when she walks on it.  Hurts all around.    Past Medical History  Diagnosis Date  . Other specified disorder of skin   . Unspecified disorder of kidney and ureter   . Senile osteoporosis   . Other nonspecific abnormal serum enzyme levels   . Essential hypertension, benign   . Pain in joint, lower leg   . Other B-complex deficiencies   . Other and unspecified hyperlipidemia   . Reflux esophagitis   . Calculus of kidney   . Dizziness and giddiness   . Edema   . Diverticulosis of colon (without mention of hemorrhage)   . Hyperlipidemia LDL goal < 100     Past Surgical History    Procedure Laterality Date  . Ganglion cyst excision  1974  . Fracture surgery N/A 1984    wrist  . Excision of adnexal mass  2007    No Known Allergies    Medication List       This list is accurate as of: 04/12/15 12:20 PM.  Always use your most recent med list.               aspirin EC 81 MG tablet  Take 81 mg by mouth daily.     meclizine 12.5 MG tablet  Commonly known as:  ANTIVERT  Take 1 tablet (12.5 mg total) by mouth 3 (three) times daily as needed for dizziness.     simvastatin 20 MG tablet  Commonly known as:  ZOCOR  TAKE 1 TABLET ONCE DAILY TO LOWER CHOLESTEROL.     Vitamin D3 2000 units capsule  Take 1 capsule (2,000 Units total) by mouth daily.        Review of Systems:  Review of Systems  Constitutional: Negative for fever and chills.  Respiratory: Negative for shortness of breath.   Cardiovascular: Negative for chest pain.  Gastrointestinal: Negative for abdominal pain.  Genitourinary: Negative for dysuria.  Musculoskeletal: Positive for joint pain. Negative for myalgias, back pain, falls and neck pain.  Right ankle, left knee, right hand  Neurological: Negative for dizziness.  Psychiatric/Behavioral: Negative for depression. The patient is not nervous/anxious.     Health Maintenance  Topic Date Due  . ZOSTAVAX  06/07/1984  . INFLUENZA VACCINE  09/07/2015  . TETANUS/TDAP  02/07/2016  . DEXA SCAN  Completed  . PNA vac Low Risk Adult  Completed    Physical Exam: Filed Vitals:   04/12/15 1153  BP: 120/70  Pulse: 75  Temp: 98.3 F (36.8 C)  TempSrc: Oral  Weight: 173 lb (78.472 kg)  SpO2: 96%   Body mass index is 30.65 kg/(m^2). Physical Exam  Constitutional: She is oriented to person, place, and time. She appears well-developed and well-nourished. No distress.  Cardiovascular: Normal rate, regular rhythm, normal heart sounds and intact distal pulses.   Pulmonary/Chest: Effort normal and breath sounds normal. No respiratory  distress.  Musculoskeletal: Normal range of motion. She exhibits tenderness.  Right hand; heberden's and bouchard's nodes; no erythema, does have warmth and swelling present but good range of motion; also mild ankle and knee swelling, but no tenderness during exam  Neurological: She is alert and oriented to person, place, and time.  Psychiatric: She has a normal mood and affect.    Labs reviewed: Basic Metabolic Panel:  Recent Labs  05/14/14 0910 05/29/14 0925  NA 142 141  K 5.9* 4.9  CL 109* 105  CO2 15* 18  GLUCOSE 100* 95  BUN 49* 35*  CREATININE 1.87* 1.53*  CALCIUM 10.1 9.5   Liver Function Tests:  Recent Labs  05/14/14 0910  AST 13  ALT 11  ALKPHOS 86  BILITOT 1.3*  PROT 6.9  ALBUMIN 4.5   No results for input(s): LIPASE, AMYLASE in the last 8760 hours. No results for input(s): AMMONIA in the last 8760 hours. CBC:  Recent Labs  05/14/14 0910  WBC 8.5  NEUTROABS 6.6  HGB 12.0  HCT 36.9  MCV 82   Lipid Panel:  Recent Labs  05/14/14 0910  CHOL 162  HDL 53  LDLCALC 86  TRIG 117  CHOLHDL 3.1   No results found for: HGBA1C  Procedures since last visit: Dg Hand Complete Right  04/08/2015  CLINICAL DATA:  Pain and swelling of the right hand for the past 2 weeks without known injury; pain is centered in the fourth digit at the proximal interphalangeal joint where there is erythema. EXAM: RIGHT HAND - COMPLETE 3+ VIEW COMPARISON:  None in PACs FINDINGS: The bones are adequately mineralized. There is severe deforming osteoarthritic change involving the PIP joints of the second through fourth fingers. There is moderate soft tissue swelling principally of the fourth digit proximally and over the PIP joint. There is somewhat milder DIP joint degenerative change with gull winging. There is moderate to severe degenerative change of the IP joint of the thumb. There is moderate diffuse narrowing of the MCP joints of all digits. No significant proliferative changes  observed. There is moderate osteoarthritic change of the first carpometacarpal joint with milder changes of the second CMC joint. There are degenerative changes of the articulation of the distal pole of the scaphoid with the trapezium and trapezoid. IMPRESSION: Severe osteoarthritic change centered on the PIP joints of the second, third, and fourth digits. Significant soft tissue swelling of the proximal aspect of the fourth digit corresponds to the clinical findings. There are milder osteoarthritic changes elsewhere as described. Electronically Signed   By: David  Martinique M.D.   On: 04/08/2015 15:42  Assessment/Plan 1. Osteoarthritis of finger of right hand - far worse than left - advised to use tylenol up to 3 grams per day total - has previously gotten excellent benefit from voltaren gel and cannot take oral nsaids whatsoever due to advanced ckd - diclofenac sodium (VOLTAREN) 1 % GEL; Apply 2 g topically 4 (four) times daily. To affected hands as needed  Dispense: 100 g; Refill: 3  2. Chronic kidney disease, stage IV (severe) (HCC) -cont to follow with Dr. Justin Mend -he stopped lisinopril due to hyperkalemia and evaluated her for secondary hyperpar and did not some hypercalcemia so she's off calcium  3. Secondary hyperparathyroidism of renal origin ([place]) -no calcium supplements due to this -continue vitamin D only -keep f/u Dr. Justin Mend  4. Primary osteoarthritis of left knee -is progressing -ok to place referral to Dr. Gladstone Lighter if this is needed as she has seen him in the past  Next appt:  07/12/2015 med mgt as scheduled  Desirie Minteer L. Ariyon Mittleman, D.O. [facility] [address], [place] [address] Cell Phone (Mon-Fri 8am-5pm):  [phone] On Call:  [phone] & follow prompts after 5pm & weekends Office Phone:  [phone] Office Fax:  [phone]

## 2015-04-13 ENCOUNTER — Telehealth: Payer: Self-pay

## 2015-04-13 NOTE — Telephone Encounter (Signed)
Prior authorization was received for diclofenac sodium 1% gel. Prior authorization was initiated by calling [phone]. Patient ID# [id number].    Awaiting determination. Reference # [id number]

## 2015-04-14 NOTE — Telephone Encounter (Signed)
Received fax from [facility] and medication was DENIED. Fax given to Dr. Mariea Clonts to review.

## 2015-04-16 DIAGNOSIS — R7989 Other specified abnormal findings of blood chemistry: Secondary | ICD-10-CM | POA: Diagnosis not present

## 2015-04-21 ENCOUNTER — Telehealth: Payer: Self-pay | Admitting: *Deleted

## 2015-04-21 NOTE — Telephone Encounter (Signed)
Prior authorization for diclofenac was faxed to [facility] for further authorization.

## 2015-04-22 NOTE — Telephone Encounter (Signed)
Pharmacy was notified of approval.   I left a message at the number on file for the patient to call the office in regards to a medication question.

## 2015-04-22 NOTE — Telephone Encounter (Signed)
Approval was received for Diclofenac sodium 1% gel and will be valid from 04/13/2015 until 02/06/2016.  Approval number [id number].

## 2015-05-18 ENCOUNTER — Other Ambulatory Visit: Payer: Self-pay | Admitting: Internal Medicine

## 2015-07-12 ENCOUNTER — Ambulatory Visit (INDEPENDENT_AMBULATORY_CARE_PROVIDER_SITE_OTHER): Payer: Medicare Other | Admitting: Internal Medicine

## 2015-07-12 ENCOUNTER — Encounter: Payer: Self-pay | Admitting: Internal Medicine

## 2015-07-12 VITALS — BP 128/80 | HR 89 | Temp 97.6°F | Ht 63.0 in | Wt 173.0 lb

## 2015-07-12 DIAGNOSIS — M19041 Primary osteoarthritis, right hand: Secondary | ICD-10-CM | POA: Diagnosis not present

## 2015-07-12 DIAGNOSIS — E785 Hyperlipidemia, unspecified: Secondary | ICD-10-CM | POA: Diagnosis not present

## 2015-07-12 DIAGNOSIS — M1712 Unilateral primary osteoarthritis, left knee: Secondary | ICD-10-CM

## 2015-07-12 DIAGNOSIS — M899 Disorder of bone, unspecified: Secondary | ICD-10-CM

## 2015-07-12 DIAGNOSIS — M8580 Other specified disorders of bone density and structure, unspecified site: Secondary | ICD-10-CM

## 2015-07-12 DIAGNOSIS — N2581 Secondary hyperparathyroidism of renal origin: Secondary | ICD-10-CM

## 2015-07-12 DIAGNOSIS — N184 Chronic kidney disease, stage 4 (severe): Secondary | ICD-10-CM | POA: Diagnosis not present

## 2015-07-12 DIAGNOSIS — I10 Essential (primary) hypertension: Secondary | ICD-10-CM

## 2015-07-12 NOTE — Progress Notes (Signed)
Location:  [facility] clinic Provider:  Lajoyce Tamura L. Mariea Clonts, D.O., C.M.D.  Code Status: DNR Goals of Care:  Advanced Directives 07/12/2015  Does patient have an advance directive? Yes  Type of Advance Directive [place]  Does patient want to make changes to advanced directive? -  Copy of advanced directive(s) in chart? Yes    Chief Complaint  Patient presents with  . Medical Management of Chronic Issues    49mth follow-up    HPI: Patient is a 82 y.o. female seen today for medical management of chronic diseases.  Here with her daughter.    She is doing well.  C/o her usual arthritis pains in her hands and right knee.  Also with chronic swelling of bilateral ankles right greater than left.  Has venous insufficiency.  Does not faithfully wear her compression hose.     She has not had a bone density for 4 years so we discussed that today and they will get that done.  Also missed annual somehow so will make next visit her annual.  She is eating well, sleeping well, bowels moving.  Bladder working ok.  Has senile purpura of hands.  Past Medical History  Diagnosis Date  . Other specified disorder of skin   . Unspecified disorder of kidney and ureter   . Senile osteoporosis   . Other nonspecific abnormal serum enzyme levels   . Essential hypertension, benign   . Pain in joint, lower leg   . Other B-complex deficiencies   . Other and unspecified hyperlipidemia   . Reflux esophagitis   . Calculus of kidney   . Dizziness and giddiness   . Edema   . Diverticulosis of colon (without mention of hemorrhage)   . Hyperlipidemia LDL goal < 100     Past Surgical History  Procedure Laterality Date  . Ganglion cyst excision  1974  . Fracture surgery N/A 1984    wrist  . Excision of adnexal mass  2007    No Known Allergies    Medication List       This list is accurate as of: 07/12/15 11:07 AM.  Always use your most recent med list.               aspirin EC 81 MG  tablet  Take 81 mg by mouth every other day.     diclofenac sodium 1 % Gel  Commonly known as:  VOLTAREN  Apply 2 g topically 4 (four) times daily. To affected hands as needed     meclizine 12.5 MG tablet  Commonly known as:  ANTIVERT  Take 1 tablet (12.5 mg total) by mouth 3 (three) times daily as needed for dizziness.     simvastatin 20 MG tablet  Commonly known as:  ZOCOR  TAKE 1 TABLET ONCE DAILY TO LOWER CHOLESTEROL.     Vitamin D3 2000 units capsule  Take 1 capsule (2,000 Units total) by mouth daily.        Review of Systems:  Review of Systems  Constitutional: Negative for fever, chills and malaise/fatigue.  HENT: Positive for hearing loss. Negative for congestion.   Eyes: Negative for blurred vision.  Respiratory: Negative for cough and shortness of breath.   Cardiovascular: Positive for leg swelling. Negative for chest pain and palpitations.  Gastrointestinal: Negative for abdominal pain, constipation, blood in stool and melena.  Genitourinary: Negative for dysuria, urgency and frequency.  Musculoskeletal: Positive for joint pain. Negative for falls.  Skin: Negative for rash.  Neurological: Negative for dizziness, loss of consciousness and weakness.  Endo/Heme/Allergies: Does not bruise/bleed easily.  Psychiatric/Behavioral: Positive for memory loss. Negative for depression. The patient is not nervous/anxious and does not have insomnia.     Health Maintenance  Topic Date Due  . ZOSTAVAX  06/07/1984  . INFLUENZA VACCINE  09/07/2015  . TETANUS/TDAP  02/07/2016  . DEXA SCAN  Completed  . PNA vac Low Risk Adult  Completed    Physical Exam: Filed Vitals:   07/12/15 1039  BP: 128/80  Pulse: 89  Temp: 97.6 F (36.4 C)  TempSrc: Oral  Height: 5\' 3"  (1.6 m)  Weight: 173 lb (78.472 kg)  SpO2: 96%   Body mass index is 30.65 kg/(m^2). Physical Exam  Constitutional: She is oriented to person, place, and time. She appears well-developed and well-nourished. No  distress.  Cardiovascular: Normal rate, regular rhythm, normal heart sounds and intact distal pulses.   Pulmonary/Chest: Effort normal and breath sounds normal. No respiratory distress.  Abdominal: Soft. Bowel sounds are normal. She exhibits no distension.  Musculoskeletal: Normal range of motion.  Walks with rollator walker   Neurological: She is alert and oriented to person, place, and time.  Skin: Skin is warm and dry.  Senile purpura on hands and forearms    Labs reviewed: Overdue for annual labs--ordered for fasting asap  Procedures since last visit: None  Assessment/Plan 1. Osteoarthritis of finger of right hand -has periods where this is worse, better right now -cont generic voltaren gel  2. Chronic kidney disease, stage IV (severe) (HCC) -has been stable, but overdue for labs so ordered for fasting asap  3. Secondary hyperparathyroidism of renal origin ([place]) - PTH, Intact and Calcium; Future  4. Primary osteoarthritis of left knee -cont use of aspercreme or voltaren gel as needed and use of rollator walker for support  5. Essential hypertension, benign -bp well controlled on current meds and no recent dizziness or lightheadedness on standing - CBC with Differential/Platelet; Future - Comprehensive metabolic panel; Future - Hemoglobin A1c; Future - Lipid panel; Future  6. Hyperlipidemia - cont zocor, f/u lipids  7. Senile osteopenia - cont vitamin D and f/u bone density - DG Bone Density; Future  Labs/tests ordered:   Orders Placed This Encounter  Procedures  . DG Bone Density    Standing Status: Future     Number of Occurrences:      Standing Expiration Date: 09/10/2016    Order Specific Question:  Reason for Exam (SYMPTOM  OR DIAGNOSIS REQUIRED)    Answer:  osteopenia, postmenopausal, estrogen deficient    Order Specific Question:  Preferred imaging location?    Answer:  [facility]  . CBC with Differential/Platelet    Standing Status: Future      Number of Occurrences:      Standing Expiration Date: 08/11/2015  . Comprehensive metabolic panel    Standing Status: Future     Number of Occurrences:      Standing Expiration Date: 08/11/2015    Order Specific Question:  Has the patient fasted?    Answer:  Yes  . Hemoglobin A1c    Standing Status: Future     Number of Occurrences:      Standing Expiration Date: 08/11/2015  . Lipid panel    Standing Status: Future     Number of Occurrences:      Standing Expiration Date: 08/11/2015    Order Specific Question:  Has the patient fasted?    Answer:  Yes  .  PTH, Intact and Calcium    Standing Status: Future     Number of Occurrences:      Standing Expiration Date: 08/10/2016   Next appt:  10/18/2015 for annual exam   Akeria Hedstrom L. Courage Biglow, D.O. [facility] [address], [place] [address] Cell Phone (Mon-Fri 8am-5pm):  [phone] On Call:  [phone] & follow prompts after 5pm & weekends Office Phone:  [phone] Office Fax:  [phone]

## 2015-07-19 ENCOUNTER — Other Ambulatory Visit: Payer: Medicare Other

## 2015-07-19 DIAGNOSIS — N2581 Secondary hyperparathyroidism of renal origin: Secondary | ICD-10-CM | POA: Diagnosis not present

## 2015-07-19 DIAGNOSIS — I10 Essential (primary) hypertension: Secondary | ICD-10-CM

## 2015-07-20 LAB — CBC WITH DIFFERENTIAL/PLATELET
Basophils Absolute: 0 10*3/uL (ref 0.0–0.2)
Basos: 0 %
EOS (ABSOLUTE): 0.1 10*3/uL (ref 0.0–0.4)
Eos: 2 %
Hematocrit: 35.8 % (ref 34.0–46.6)
Hemoglobin: 11.8 g/dL (ref 11.1–15.9)
Immature Grans (Abs): 0 10*3/uL (ref 0.0–0.1)
Immature Granulocytes: 0 %
Lymphocytes Absolute: 1.4 10*3/uL (ref 0.7–3.1)
Lymphs: 18 %
MCH: 26.3 pg — ABNORMAL LOW (ref 26.6–33.0)
MCHC: 33 g/dL (ref 31.5–35.7)
MCV: 80 fL (ref 79–97)
Monocytes Absolute: 0.6 10*3/uL (ref 0.1–0.9)
Monocytes: 7 %
Neutrophils Absolute: 5.4 10*3/uL (ref 1.4–7.0)
Neutrophils: 73 %
Platelets: 225 10*3/uL (ref 150–379)
RBC: 4.48 x10E6/uL (ref 3.77–5.28)
RDW: 13.6 % (ref 12.3–15.4)
WBC: 7.5 10*3/uL (ref 3.4–10.8)

## 2015-07-20 LAB — LIPID PANEL
Chol/HDL Ratio: 3.1 ratio units (ref 0.0–4.4)
Cholesterol, Total: 164 mg/dL (ref 100–199)
HDL: 53 mg/dL (ref >39)
LDL Calculated: 89 mg/dL (ref 0–99)
Triglycerides: 110 mg/dL (ref 0–149)
VLDL Cholesterol Cal: 22 mg/dL (ref 5–40)

## 2015-07-20 LAB — COMPREHENSIVE METABOLIC PANEL
ALT: 9 IU/L (ref 0–32)
AST: 16 IU/L (ref 0–40)
Albumin/Globulin Ratio: 1.8 (ref 1.2–2.2)
Albumin: 4.5 g/dL (ref 3.2–4.6)
Alkaline Phosphatase: 85 IU/L (ref 39–117)
BUN/Creatinine Ratio: 19 (ref 12–28)
BUN: 30 mg/dL (ref 10–36)
Bilirubin Total: 1.3 mg/dL — ABNORMAL HIGH (ref 0.0–1.2)
CO2: 22 mmol/L (ref 18–29)
Calcium: 10 mg/dL (ref 8.7–10.3)
Chloride: 105 mmol/L (ref 96–106)
Creatinine, Ser: 1.58 mg/dL — ABNORMAL HIGH (ref 0.57–1.00)
GFR calc Af Amer: 33 mL/min/{1.73_m2} — ABNORMAL LOW (ref >59)
GFR calc non Af Amer: 28 mL/min/{1.73_m2} — ABNORMAL LOW (ref >59)
Globulin, Total: 2.5 g/dL (ref 1.5–4.5)
Glucose: 95 mg/dL (ref 65–99)
Potassium: 4.8 mmol/L (ref 3.5–5.2)
Sodium: 143 mmol/L (ref 134–144)
Total Protein: 7 g/dL (ref 6.0–8.5)

## 2015-07-20 LAB — HEMOGLOBIN A1C
Est. average glucose Bld gHb Est-mCnc: 108 mg/dL
Hgb A1c MFr Bld: 5.4 % (ref 4.8–5.6)

## 2015-07-20 LAB — PTH, INTACT AND CALCIUM: PTH: 40 pg/mL (ref 15–65)

## 2015-07-22 ENCOUNTER — Encounter: Payer: Self-pay | Admitting: *Deleted

## 2015-08-11 ENCOUNTER — Ambulatory Visit
Admission: RE | Admit: 2015-08-11 | Discharge: 2015-08-11 | Disposition: A | Discharge status: Home / Self Care | Payer: Medicare Other | Source: Ambulatory Visit | Attending: Internal Medicine | Admitting: Internal Medicine

## 2015-08-11 DIAGNOSIS — Z780 Asymptomatic menopausal state: Secondary | ICD-10-CM | POA: Diagnosis not present

## 2015-08-11 DIAGNOSIS — M810 Age-related osteoporosis without current pathological fracture: Secondary | ICD-10-CM | POA: Diagnosis not present

## 2015-08-11 DIAGNOSIS — M8580 Other specified disorders of bone density and structure, unspecified site: Secondary | ICD-10-CM

## 2015-08-16 ENCOUNTER — Encounter: Payer: Self-pay | Admitting: *Deleted

## 2015-10-16 ENCOUNTER — Other Ambulatory Visit: Payer: Self-pay | Admitting: Internal Medicine

## 2015-10-18 ENCOUNTER — Ambulatory Visit (INDEPENDENT_AMBULATORY_CARE_PROVIDER_SITE_OTHER): Payer: Medicare Other | Admitting: Internal Medicine

## 2015-10-18 ENCOUNTER — Encounter: Payer: Self-pay | Admitting: Internal Medicine

## 2015-10-18 VITALS — BP 120/80 | HR 74 | Temp 97.7°F | Ht 63.0 in | Wt 173.0 lb

## 2015-10-18 DIAGNOSIS — N184 Chronic kidney disease, stage 4 (severe): Secondary | ICD-10-CM | POA: Diagnosis not present

## 2015-10-18 DIAGNOSIS — I10 Essential (primary) hypertension: Secondary | ICD-10-CM

## 2015-10-18 DIAGNOSIS — Z23 Encounter for immunization: Secondary | ICD-10-CM

## 2015-10-18 DIAGNOSIS — R590 Localized enlarged lymph nodes: Secondary | ICD-10-CM

## 2015-10-18 DIAGNOSIS — R599 Enlarged lymph nodes, unspecified: Secondary | ICD-10-CM

## 2015-10-18 DIAGNOSIS — E785 Hyperlipidemia, unspecified: Secondary | ICD-10-CM

## 2015-10-18 DIAGNOSIS — Z0000 Encounter for general adult medical examination without abnormal findings: Secondary | ICD-10-CM

## 2015-10-18 MED ORDER — SIMVASTATIN 20 MG PO TABS: 20.0000 mg | ORAL_TABLET | Freq: Every day | ORAL | 3 refills | Status: DC

## 2015-10-18 NOTE — Progress Notes (Signed)
Location:  [facility] clinic Provider: Colby Catanese L. Mariea Clonts, D.O., C.M.D.  Patient Care Team: Gayland Curry, DO as PCP - General (Geriatric Medicine) Edrick Oh, MD as Consulting Physician (Nephrology)  Extended Emergency Contact Information Primary Emergency Contact: Emeline Gins Address: [address]          [place], [place] [address] Johnnette Litter of [place] Phone: [phone] Mobile Phone: [phone] Relation: Daughter  Code Status: DNR Goals of Care: Advanced Directive information Advanced Directives 10/18/2015  Does patient have an advance directive? Yes  Type of Paramedic of [place];Living will;Out of facility DNR (pink MOST or yellow form)  Does patient want to make changes to advanced directive? -  Copy of advanced directive(s) in chart? Yes  Pre-existing out of facility DNR order (yellow form or pink MOST form) Yellow form placed in chart (order not valid for inpatient use);Pink MOST form placed in chart (order not valid for inpatient use)   Chief Complaint  Patient presents with  . Annual Exam    wellness exam    HPI: Patient is a 82 y.o. female seen in today for an annual wellness exam.    Depression screen [facility] 2/9 10/18/2015 07/12/2015 05/18/2014 01/19/2014 05/12/2013  Decreased Interest 0 0 0 0 0  Down, Depressed, Hopeless 0 0 0 0 0  PHQ - 2 Score 0 0 0 0 0    Fall Risk  10/18/2015 07/12/2015 12/14/2014 05/18/2014 01/19/2014  Falls in the past year? No No No No No  Risk for fall due to : - - - - -  Risk for fall due to (comments): - - - - -   MMSE - Mini Mental State Exam 10/18/2015 05/18/2014  Orientation to time 2 3  Orientation to Place 3 3  Registration 3 3  Attention/ Calculation 0 1  Recall 1 0  Language- name 2 objects 2 2  Language- repeat 1 1  Language- follow 3 step command 1 3  Language- read & follow direction 1 1  Write a sentence 1 0  Copy design 0 0  Total score 15 17  failed clock  Health Maintenance  Topic Date Due    . ZOSTAVAX  06/07/1984  . INFLUENZA VACCINE  09/07/2015  . TETANUS/TDAP  02/07/2016  . DEXA SCAN  Completed  . PNA vac Low Risk Adult  Completed   Functional Status Survey: Is the patient deaf or have difficulty hearing?: (P) No Does the patient have difficulty seeing, even when wearing glasses/contacts?: (P) No Does the patient have difficulty concentrating, remembering, or making decisions?: (P) Yes Does the patient have difficulty walking or climbing stairs?: (P) Yes Does the patient have difficulty dressing or bathing?: (P) No Does the patient have difficulty doing errands alone such as visiting a doctor's office or shopping?: (P) Yes (her daughter goes with her everywhere) Current Exercise Habits: (P) The patient does not participate in regular exercise at present Exercise limited by: (P) orthopedic condition(s) Diet? regular Vision Screening Comments: Dr. Gershon Crane did eye exam Hearing:  Hearing well Dentition:  No new tooth problems, has a partial, but doesn't wear it much  Past Medical History:  Diagnosis Date  . Calculus of kidney   . Diverticulosis of colon (without mention of hemorrhage)   . Dizziness and giddiness   . Edema   . Essential hypertension, benign   . Hyperlipidemia LDL goal < 100   . Other and unspecified hyperlipidemia   . Other B-complex deficiencies   . Other  nonspecific abnormal serum enzyme levels   . Other specified disorder of skin   . Pain in joint, lower leg   . Reflux esophagitis   . Senile osteoporosis   . Unspecified disorder of kidney and ureter     Past Surgical History:  Procedure Laterality Date  . EXCISION OF ADNEXAL MASS  2007  . FRACTURE SURGERY N/A 1984   wrist  . GANGLION CYST EXCISION  1974    Family History  Problem Relation Age of Onset  . Cancer Brother     Social History   Social History  . Marital status: Widowed    Spouse name: N/A  . Number of children: N/A  . Years of education: N/A   Social History Main  Topics  . Smoking status: Never Smoker  . Smokeless tobacco: Never Used  . Alcohol use No  . Drug use: No  . Sexual activity: Not Asked   Other Topics Concern  . None   Social History Narrative   Widowed 2009   Lives in [place]   Never smoked   Alcohol none   Exercise none   DNR, MOST, POA   Walks with cane                reports that she has never smoked. She has never used smokeless tobacco. She reports that she does not drink alcohol or use drugs.  No Known Allergies    Medication List       Accurate as of 10/18/15  2:29 PM. Always use your most recent med list.          aspirin EC 81 MG tablet Take 81 mg by mouth every other day.   diclofenac sodium 1 % Gel Commonly known as:  VOLTAREN Apply 2 g topically 4 (four) times daily. To affected hands as needed   meclizine 12.5 MG tablet Commonly known as:  ANTIVERT Take 1 tablet (12.5 mg total) by mouth 3 (three) times daily as needed for dizziness.   simvastatin 20 MG tablet Commonly known as:  ZOCOR Take 1 tablet (20 mg total) by mouth daily at 6 PM.   Vitamin D3 2000 units capsule Take 1 capsule (2,000 Units total) by mouth daily.      Review of Systems:  Review of Systems  Constitutional: Negative for chills and fever.  HENT: Negative for congestion and hearing loss.   Eyes: Negative for blurred vision.  Respiratory: Negative for cough and shortness of breath.   Cardiovascular: Negative for chest pain, palpitations and leg swelling.  Gastrointestinal: Negative for abdominal pain, blood in stool, constipation, diarrhea and melena.  Genitourinary: Positive for urgency. Negative for dysuria and frequency.  Musculoskeletal: Negative for back pain, falls, joint pain, myalgias and neck pain.  Skin: Negative for itching and rash.       Small bump in right supraclavicular area, not painful  Neurological: Negative for headaches.    Physical Exam: Vitals:   10/18/15 1346  BP: 120/80  Pulse: 74    Temp: 97.7 F (36.5 C)  TempSrc: Oral  SpO2: 96%  Weight: 173 lb (78.5 kg)  Height: 5\' 3"  (1.6 m)   Body mass index is 30.65 kg/m. Physical Exam  Constitutional: She appears well-developed and well-nourished. No distress.  HENT:  Head: Normocephalic and atraumatic.  Right Ear: External ear normal.  Left Ear: External ear normal.  Nose: Nose normal.  Mouth/Throat: Oropharynx is clear and moist.  Eyes: Conjunctivae and EOM are normal. Pupils are equal,  round, and reactive to light.  Neck: Normal range of motion. Neck supple. No JVD present. No tracheal deviation present. No thyromegaly present.  Cardiovascular: Normal rate, regular rhythm, normal heart sounds and intact distal pulses.   Pulmonary/Chest: Effort normal and breath sounds normal. No respiratory distress.  Abdominal: Soft. Bowel sounds are normal. She exhibits distension. She exhibits no mass. There is no tenderness. There is no rebound and no guarding. No hernia.  Musculoskeletal: Normal range of motion.  Lymphadenopathy:       Right: Supraclavicular adenopathy present.  Not certain that nickel sized hard nodule is for sure a lymph node  Neurological: She is alert. She has normal reflexes. No cranial nerve deficit.  Short term memory loss during visit; looks to her daughter to help with answers  Skin: Skin is warm and dry.  Psychiatric: She has a normal mood and affect.    Labs reviewed: Basic Metabolic Panel:  Recent Labs  07/19/15 1018  NA 143  K 4.8  CL 105  CO2 22  GLUCOSE 95  BUN 30  CREATININE 1.58*  CALCIUM 10.0   Liver Function Tests:  Recent Labs  07/19/15 1018  AST 16  ALT 9  ALKPHOS 85  BILITOT 1.3*  PROT 7.0  ALBUMIN 4.5   No results for input(s): LIPASE, AMYLASE in the last 8760 hours. No results for input(s): AMMONIA in the last 8760 hours. CBC:  Recent Labs  07/19/15 1018  WBC 7.5  NEUTROABS 5.4  HCT 35.8  MCV 80  PLT 225   Lipid Panel:  Recent Labs  07/19/15 1018   CHOL 164  HDL 53  LDLCALC 89  TRIG 110  CHOLHDL 3.1   Lab Results  Component Value Date   HGBA1C 5.4 07/19/2015    Procedures: EKG:  NSR at 76bpm, no acute ischemia or infarct and none to compare  Assessment/Plan 1. Medicare annual wellness visit, subsequent - see hpi, flowsheets - CBC with Differential/Platelet; Future - Comprehensive metabolic panel; Future - Hemoglobin A1c; Future - Lipid panel; Future  2. Essential hypertension, benign -bp satisfactory for a 82 yo ambulatory female - EKG 12-Lead - Comprehensive metabolic panel; Future  3. Chronic kidney disease, stage IV (severe) (HCC) - had been following with Dr. Justin Mend and stable, f/u labs before next visit: - Comprehensive metabolic panel; Future  4. Hyperlipidemia -cont low dose statin therapy, tolerates ok - Hemoglobin A1c; Future - Lipid panel; Future  5. Supraclavicular adenopathy - suspected (has nodule right supraclavicular area which could be a cyst, lipoma or node, but it's rather firm, is somewhat mobile) - US Soft Tissue Head/Neck; Future  6. Need for prophylactic vaccination and inoculation against influenza - Flu Vaccine QUAD 36+ mos PF IM (Fluarix & Fluzone Quad PF)  Labs/tests ordered: Orders Placed This Encounter  Procedures  . US Soft Tissue Head/Neck    Standing Status:   Future    Standing Expiration Date:   12/17/2016    Order Specific Question:   Reason for Exam (SYMPTOM  OR DIAGNOSIS REQUIRED)    Answer:   right supraclavicular area nodule (?lymph node, lipoma, cyst)    Order Specific Question:   Preferred imaging location?    Answer:   [facility]  . Flu Vaccine QUAD 36+ mos PF IM (Fluarix & Fluzone Quad PF)  . CBC with Differential/Platelet    Standing Status:   Future    Standing Expiration Date:   06/16/2016  . Comprehensive metabolic panel    Standing Status:  Future    Standing Expiration Date:   06/16/2016    Order Specific Question:   Has the patient fasted?     Answer:   Yes  . Hemoglobin A1c    Standing Status:   Future    Standing Expiration Date:   06/16/2016  . Lipid panel    Standing Status:   Future    Standing Expiration Date:   06/16/2016    Order Specific Question:   Has the patient fasted?    Answer:   Yes  . EKG 12-Lead    Next appt:  4 mos med mgt, labs before  Reha Martinovich L. Corrinna Karapetyan, D.O. [facility] [address], [place] [address] Cell Phone (Mon-Fri 8am-5pm):  [phone] On Call:  [phone] & follow prompts after 5pm & weekends Office Phone:  [phone] Office Fax:  [phone]

## 2015-11-03 ENCOUNTER — Ambulatory Visit
Admission: RE | Admit: 2015-11-03 | Discharge: 2015-11-03 | Disposition: A | Discharge status: Home / Self Care | Payer: Medicare Other | Source: Ambulatory Visit | Attending: Internal Medicine | Admitting: Internal Medicine

## 2015-11-03 DIAGNOSIS — E041 Nontoxic single thyroid nodule: Secondary | ICD-10-CM | POA: Diagnosis not present

## 2015-11-03 DIAGNOSIS — R590 Localized enlarged lymph nodes: Secondary | ICD-10-CM

## 2015-11-08 ENCOUNTER — Other Ambulatory Visit: Payer: Self-pay | Admitting: Internal Medicine

## 2015-11-08 DIAGNOSIS — R590 Localized enlarged lymph nodes: Secondary | ICD-10-CM

## 2015-11-08 NOTE — Progress Notes (Signed)
Pt is 82 yo with dementia and living in [facility].  During physical, she was noted to have a new nontender obvious right supraclavicular node.  [place] of soft tissues of neck confirms abnormal lymph node.  Of note, her quality of life is excellent as is and she is not a good candidate for any aggressive therapy that will affect her QOL negatively--she feels well at this time and gets around with her walker, short term memory is poor and she has CKD.  I spoke with her daughter about this and she is in agreement that we will find out a cause, but treatments should be limited to palliative in nature.  Goals are comfort and quality.  She did smoke many years ago and has COPD on chest xray. No other symptoms besides abnormal node and no other nodes per palpable during her physical.  Given location, will obtain CT chest to further evaluate for primary and refer to general surgery for percutaneous biopsy of supraclavicular node.

## 2015-11-15 ENCOUNTER — Ambulatory Visit
Admission: RE | Admit: 2015-11-15 | Discharge: 2015-11-15 | Disposition: A | Discharge status: Home / Self Care | Payer: Medicare Other | Source: Ambulatory Visit | Attending: Internal Medicine | Admitting: Internal Medicine

## 2015-11-15 DIAGNOSIS — R918 Other nonspecific abnormal finding of lung field: Secondary | ICD-10-CM | POA: Diagnosis not present

## 2015-11-15 DIAGNOSIS — R590 Localized enlarged lymph nodes: Secondary | ICD-10-CM

## 2015-11-17 ENCOUNTER — Telehealth: Payer: Self-pay | Admitting: *Deleted

## 2015-11-17 NOTE — Telephone Encounter (Signed)
[name], daughter called and request you to call her. She stated that she would like to speak with you regarding CT Scan patient had done. Would like for you to call her [phone] or [phone]

## 2015-11-17 NOTE — Telephone Encounter (Signed)
I had sent a message with the results.  Has she been notified?   If she still wants to speak with me, that's fine, but let's try to make sure she received the information I wrote about the CT.

## 2015-11-18 ENCOUNTER — Encounter: Payer: Self-pay | Admitting: *Deleted

## 2015-11-19 NOTE — Telephone Encounter (Signed)
Shannon Berg spoke with daughter yesterday.

## 2016-01-14 ENCOUNTER — Encounter: Payer: Self-pay | Admitting: Internal Medicine

## 2016-01-14 ENCOUNTER — Ambulatory Visit (INDEPENDENT_AMBULATORY_CARE_PROVIDER_SITE_OTHER): Payer: Medicare Other | Admitting: Internal Medicine

## 2016-01-14 VITALS — BP 134/80 | HR 74 | Temp 97.5°F | Ht 63.0 in | Wt 175.0 lb

## 2016-01-14 DIAGNOSIS — L723 Sebaceous cyst: Secondary | ICD-10-CM | POA: Diagnosis not present

## 2016-01-14 DIAGNOSIS — R229 Localized swelling, mass and lump, unspecified: Secondary | ICD-10-CM

## 2016-01-14 DIAGNOSIS — L089 Local infection of the skin and subcutaneous tissue, unspecified: Secondary | ICD-10-CM | POA: Diagnosis not present

## 2016-01-14 MED ORDER — DOXYCYCLINE HYCLATE 100 MG PO TABS: 100.0000 mg | ORAL_TABLET | Freq: Two times a day (BID) | ORAL | 0 refills | Status: DC

## 2016-01-14 NOTE — Patient Instructions (Addendum)
Apply warm compress to right neck at least 2 times daily x 7 days or until opens up  Take antibiotic 2 times daily with food x 10 days  Take culturelle packets daily while on antibiotic to keep colon healthy  Will call with referral to ENT  Follow up with Dr Mariea Clonts in Jan 2018 for routine visit or sooner if cyst does not improve

## 2016-01-14 NOTE — Progress Notes (Signed)
Patient ID: Shannon Berg, female   DOB: January 10, 1923, 82 y.o.   MRN: [id number]    Location:  [facility] Place of Service: OFFICE  Chief Complaint  Patient presents with  . Acute Visit    mass on neck red with blood and pus painful at times     HPI:  82 yo female seen today for 3 day hx raised painful knot on right side of neck. No d/c, f/c. No RUE paresthesias or weakness. She had US neck in September that revealed "Solid, well-circumscribed right supraclavicular 1.7 x 1 x 1.8 cm hypoechoic mass with a few speckled calcifications". She then had a CT chest that showed  "a small subcutaneous mass in the medial right supraclavicular region in the area of palpable fullness. This mass measures 2.0 x 1.4 x 1.1 cm. This lesion may represent sebaceous cyst given its superficial location and ovoid appearance. It appears benign on noncontrast enhanced CT examination".  She denies any recent travel hx and recalls no insect bites. She is a poor historian due to memory loss. Hx obtained from daughter. No hx skin cancer or lymphoma  Past Medical History:  Diagnosis Date  . Calculus of kidney   . Diverticulosis of colon (without mention of hemorrhage)   . Dizziness and giddiness   . Edema   . Essential hypertension, benign   . Hyperlipidemia LDL goal < 100   . Other and unspecified hyperlipidemia   . Other B-complex deficiencies   . Other nonspecific abnormal serum enzyme levels   . Other specified disorder of skin   . Pain in joint, lower leg   . Reflux esophagitis   . Senile osteoporosis   . Unspecified disorder of kidney and ureter     Past Surgical History:  Procedure Laterality Date  . EXCISION OF ADNEXAL MASS  2007  . FRACTURE SURGERY N/A 1984   wrist  . GANGLION CYST EXCISION  1974    Patient Care Team: Gayland Curry, DO as PCP - General (Geriatric Medicine) Edrick Oh, MD as Consulting Physician (Nephrology)  Social History   Social History  . Marital status: Widowed    Spouse  name: N/A  . Number of children: N/A  . Years of education: N/A   Occupational History  . Not on file.   Social History Main Topics  . Smoking status: Never Smoker  . Smokeless tobacco: Never Used  . Alcohol use No  . Drug use: No  . Sexual activity: Not on file   Other Topics Concern  . Not on file   Social History Narrative   Widowed 2009   Lives in [place]   Never smoked   Alcohol none   Exercise none   DNR, MOST, POA   Walks with cane                 reports that she has never smoked. She has never used smokeless tobacco. She reports that she does not drink alcohol or use drugs.  Family History  Problem Relation Age of Onset  . Cancer Brother    Family Status  Relation Status  . Mother Deceased  . Father Deceased  . Sister Deceased  . Brother Deceased  . Daughter Alive  . Brother Deceased  . Brother Deceased  . Sister Deceased     No Known Allergies  Medications: Patient's Medications  New Prescriptions   No medications on file  Previous Medications   ASPIRIN EC 81 MG TABLET  Take 81 mg by mouth every other day.    CHOLECALCIFEROL (VITAMIN D3) 2000 UNITS CAPSULE    Take 1 capsule (2,000 Units total) by mouth daily.   DICLOFENAC SODIUM (VOLTAREN) 1 % GEL    Apply 2 g topically 4 (four) times daily. To affected hands as needed   MECLIZINE (ANTIVERT) 12.5 MG TABLET    Take 1 tablet (12.5 mg total) by mouth 3 (three) times daily as needed for dizziness.   SIMVASTATIN (ZOCOR) 20 MG TABLET    Take 1 tablet (20 mg total) by mouth daily at 6 PM.  Modified Medications   No medications on file  Discontinued Medications   No medications on file    Review of Systems  Unable to perform ROS: Other (memory loss)    Vitals:   01/14/16 1530  BP: 134/80  Pulse: 74  Temp: 97.5 F (36.4 C)  TempSrc: Oral  SpO2: 96%  Weight: 175 lb (79.4 kg)  Height: 5\' 3"  (1.6 m)   Body mass index is 31 kg/m.  Physical Exam  Constitutional: She appears  well-developed and well-nourished.  Neck: Trachea normal. Neck supple. No spinous process tenderness and no muscular tenderness present. Decreased range of motion present. No edema present.    Lymphadenopathy:       Head (right side): No submental, no submandibular, no posterior auricular and no occipital adenopathy present.       Head (left side): No submental, no submandibular, no posterior auricular and no occipital adenopathy present.    She has no cervical adenopathy.       Right: No supraclavicular adenopathy present.       Left: No supraclavicular adenopathy present.  Neurological: She is alert.  Skin: Skin is warm and dry. No rash noted.  Psychiatric: She has a normal mood and affect. Her behavior is normal.     Labs reviewed: No visits with results within 3 Month(s) from this visit.  Latest known visit with results is:  Appointment on 07/19/2015  Component Date Value Ref Range Status  . WBC 07/20/2015 7.5  3.4 - 10.8 x10E3/uL Final  . RBC 07/20/2015 4.48  3.77 - 5.28 x10E6/uL Final  . Hemoglobin 07/20/2015 11.8  11.1 - 15.9 g/dL Final  . Hematocrit 07/20/2015 35.8  34.0 - 46.6 % Final  . MCV 07/20/2015 80  79 - 97 fL Final  . MCH 07/20/2015 26.3* 26.6 - 33.0 pg Final  . MCHC 07/20/2015 33.0  31.5 - 35.7 g/dL Final  . RDW 07/20/2015 13.6  12.3 - 15.4 % Final  . Platelets 07/20/2015 225  150 - 379 x10E3/uL Final  . Neutrophils 07/20/2015 73  % Final  . Lymphs 07/20/2015 18  % Final  . Monocytes 07/20/2015 7  % Final  . Eos 07/20/2015 2  % Final  . Basos 07/20/2015 0  % Final  . Neutrophils Absolute 07/20/2015 5.4  1.4 - 7.0 x10E3/uL Final  . Lymphocytes Absolute 07/20/2015 1.4  0.7 - 3.1 x10E3/uL Final  . Monocytes Absolute 07/20/2015 0.6  0.1 - 0.9 x10E3/uL Final  . EOS (ABSOLUTE) 07/20/2015 0.1  0.0 - 0.4 x10E3/uL Final  . Basophils Absolute 07/20/2015 0.0  0.0 - 0.2 x10E3/uL Final  . Immature Granulocytes 07/20/2015 0  % Final  . Immature Grans (Abs) 07/20/2015 0.0   0.0 - 0.1 x10E3/uL Final  . Glucose 07/20/2015 95  65 - 99 mg/dL Final  . BUN 07/20/2015 30  10 - 36 mg/dL Final  . Creatinine, Ser 07/20/2015 1.58*  0.57 - 1.00 mg/dL Final  . GFR calc non Af Amer 07/20/2015 28* >59 mL/min/1.73 Final  . GFR calc Af Amer 07/20/2015 33* >59 mL/min/1.73 Final  . BUN/Creatinine Ratio 07/20/2015 19  12 - 28 Final  . Sodium 07/20/2015 143  134 - 144 mmol/L Final  . Potassium 07/20/2015 4.8  3.5 - 5.2 mmol/L Final  . Chloride 07/20/2015 105  96 - 106 mmol/L Final  . CO2 07/20/2015 22  18 - 29 mmol/L Final  . Calcium 07/20/2015 10.0  8.7 - 10.3 mg/dL Final  . Total Protein 07/20/2015 7.0  6.0 - 8.5 g/dL Final  . Albumin 07/20/2015 4.5  3.2 - 4.6 g/dL Final  . Globulin, Total 07/20/2015 2.5  1.5 - 4.5 g/dL Final  . Albumin/Globulin Ratio 07/20/2015 1.8  1.2 - 2.2 Final  . Bilirubin Total 07/20/2015 1.3* 0.0 - 1.2 mg/dL Final  . Alkaline Phosphatase 07/20/2015 85  39 - 117 IU/L Final  . AST 07/20/2015 16  0 - 40 IU/L Final  . ALT 07/20/2015 9  0 - 32 IU/L Final  . Hgb A1c MFr Bld 07/20/2015 5.4  4.8 - 5.6 % Final   Comment:          Pre-diabetes: 5.7 - 6.4          Diabetes: >6.4          Glycemic control for adults with diabetes: <7.0   . Est. average glucose Bld gHb Est-m* 07/20/2015 108  mg/dL Final  . Cholesterol, Total 07/20/2015 164  100 - 199 mg/dL Final  . Triglycerides 07/20/2015 110  0 - 149 mg/dL Final  . HDL 07/20/2015 53  >39 mg/dL Final  . VLDL Cholesterol Cal 07/20/2015 22  5 - 40 mg/dL Final  . LDL Calculated 07/20/2015 89  0 - 99 mg/dL Final  . Chol/HDL Ratio 07/20/2015 3.1  0.0 - 4.4 ratio units Final   Comment:                                   T. Chol/HDL Ratio                                             Men  Women                               1/2 Avg.Risk  3.4    3.3                                   Avg.Risk  5.0    4.4                                2X Avg.Risk  9.6    7.1                                3X Avg.Risk 23.4   11.0   .  PTH 07/20/2015 40  15 - 65 pg/mL Final  . PTH 07/20/2015 Comment   Final   Comment: Interpretation  Intact PTH    Calcium                                 (pg/mL)      (mg/dL) Normal                          15 - 65     8.6 - 10.2 Primary Hyperparathyroidism         >65          >10.2 Secondary Hyperparathyroidism       >65          <10.2 Non-Parathyroid Hypercalcemia       <65          >10.2 Hypoparathyroidism                  <15          < 8.6 Non-Parathyroid Hypocalcemia    15 - 65          < 8.6     No results found.   Assessment/Plan   ICD-9-CM ICD-10-CM   1. Infected sebaceous cyst 706.2 L72.3 doxycycline (VIBRA-TABS) 100 MG tablet    L08.9 Ambulatory referral to ENT  2. Lump of skin 782.2 R22.9 Ambulatory referral to ENT   right supraclavicular   Apply warm compress to right neck at least 2 times daily x 7 days or until opens up  Take antibiotic 2 times daily with food x 10 days  Take culturelle packets daily while on antibiotic to keep colon healthy  Will call with referral to ENT  Follow up with Dr Mariea Clonts in Jan 2018 for routine visit or sooner if cyst does not improve  Lorelle Macaluso S. Perlie Gold  [facility] [address] [place], [place] [address] [phone] Cell (Monday-Friday 8 AM - 5 PM) [phone] After 5 PM and follow prompts

## 2016-01-24 ENCOUNTER — Telehealth: Payer: Self-pay | Admitting: *Deleted

## 2016-01-24 NOTE — Telephone Encounter (Signed)
Is she still having pain and redness in the area. Is it still large? If so, recommend re-evaluation in the office

## 2016-01-24 NOTE — Telephone Encounter (Signed)
It has flattened down considerably and has started started healing. No redness, No Pain, No Fever. Please Advise.

## 2016-01-24 NOTE — Telephone Encounter (Signed)
Recommend no further abx at this time as it sounds like it is healing. Continue warm compresses for 5 additional days. Call if drainage continues after this

## 2016-01-24 NOTE — Telephone Encounter (Signed)
Patient daughter, Shannon Berg called and stated that they saw you last week for infected cyst and given an antibiotic, which she has finished. Stated that the cyst is still oozing and has not completely healed. Patient is using warm compresses. Daughter wants to  Know if they should be using the warm compresses or does she need another round of antibiotics or both. No fever.  Please Advise.

## 2016-01-25 NOTE — Telephone Encounter (Signed)
Left message on voicemail with Dr.Carter's response. I instructed patient's daughter to return call if questions or concerns

## 2016-01-25 NOTE — Telephone Encounter (Signed)
Routed to [place] to notify patient since I'm at [place]. Thanks

## 2016-02-17 ENCOUNTER — Ambulatory Visit: Payer: Medicare Other | Admitting: Internal Medicine

## 2016-02-21 ENCOUNTER — Ambulatory Visit: Payer: Medicare Other | Admitting: Internal Medicine

## 2016-02-22 DIAGNOSIS — L720 Epidermal cyst: Secondary | ICD-10-CM | POA: Insufficient documentation

## 2016-02-22 DIAGNOSIS — L723 Sebaceous cyst: Secondary | ICD-10-CM | POA: Diagnosis not present

## 2016-02-28 ENCOUNTER — Ambulatory Visit (INDEPENDENT_AMBULATORY_CARE_PROVIDER_SITE_OTHER): Payer: Medicare Other | Admitting: Internal Medicine

## 2016-02-28 ENCOUNTER — Encounter: Payer: Self-pay | Admitting: Internal Medicine

## 2016-02-28 VITALS — BP 120/68 | HR 89 | Temp 98.4°F | Wt 168.0 lb

## 2016-02-28 DIAGNOSIS — I10 Essential (primary) hypertension: Secondary | ICD-10-CM

## 2016-02-28 DIAGNOSIS — M19041 Primary osteoarthritis, right hand: Secondary | ICD-10-CM | POA: Diagnosis not present

## 2016-02-28 DIAGNOSIS — N184 Chronic kidney disease, stage 4 (severe): Secondary | ICD-10-CM | POA: Diagnosis not present

## 2016-02-28 DIAGNOSIS — E7800 Pure hypercholesterolemia, unspecified: Secondary | ICD-10-CM

## 2016-02-28 DIAGNOSIS — L723 Sebaceous cyst: Secondary | ICD-10-CM | POA: Diagnosis not present

## 2016-02-28 DIAGNOSIS — L089 Local infection of the skin and subcutaneous tissue, unspecified: Secondary | ICD-10-CM

## 2016-02-28 MED ORDER — DICLOFENAC SODIUM 1 % TD GEL: 2.0000 g | Freq: Four times a day (QID) | TRANSDERMAL | 3 refills | Status: DC

## 2016-02-28 NOTE — Progress Notes (Signed)
Location:  [facility] clinic Provider:  Gayle Martinez L. Mariea Clonts, D.O., C.M.D.  Code Status: DNR Goals of Care:  Advanced Directives 02/28/2016  Does Patient Have a Medical Advance Directive? Yes  Type of Paramedic of [place];Living will;Out of facility DNR (pink MOST or yellow form)  Does patient want to make changes to medical advance directive? No - Patient declined  Copy of [place] in Chart? Yes  Pre-existing out of facility DNR order (yellow form or pink MOST form) Yellow form placed in chart (order not valid for inpatient use);Pink MOST form placed in chart (order not valid for inpatient use)     Chief Complaint  Patient presents with  . Medical Management of Chronic Issues    1 mth follow-up    HPI: Patient is a 83 y.o. female seen today for medical management of chronic diseases.    Sebaceous cyst right supraclavicular area:  Pt came to me with this raised nodule in this region the size of a grape.  We were concerned for lymph node so US done which also raised this concern.  CT chest done which then suggested sebaceous cyst.  We opted to monitor.  Then it became infected and pt came in to see Dr. Eulas Post who treated it accordingly and then referred her to ENT.  She saw ENT, Dr. Redmond Baseman.  He plans to remove it when his nurse is available to assist.  It is much smaller than it had been since the infection.  No c/o pain at this point, but uses voltaren gel on her hands which is costly.  Memory continues to worsen gradually.  No falls. No vertigo spells lately.  CKDIV:  Follows with Dr Justin Mend and continues to struggle to drink enough water  BP:  Adequately controlled for fall risk and age of 70  Hyperlipidemia:  Also fine for 83 yo lady with cognitive losses and currently good functional status.  Past Medical History:  Diagnosis Date  . Calculus of kidney   . Diverticulosis of colon (without mention of hemorrhage)   . Dizziness and giddiness     . Edema   . Essential hypertension, benign   . Hyperlipidemia LDL goal < 100   . Other and unspecified hyperlipidemia   . Other B-complex deficiencies   . Other nonspecific abnormal serum enzyme levels   . Other specified disorder of skin   . Pain in joint, lower leg   . Reflux esophagitis   . Senile osteoporosis   . Unspecified disorder of kidney and ureter     Past Surgical History:  Procedure Laterality Date  . EXCISION OF ADNEXAL MASS  2007  . FRACTURE SURGERY N/A 1984   wrist  . GANGLION CYST EXCISION  1974    No Known Allergies  Allergies as of 02/28/2016   No Known Allergies     Medication List       Accurate as of 02/28/16  3:48 PM. Always use your most recent med list.          aspirin EC 81 MG tablet Take 81 mg by mouth every other day.   diclofenac sodium 1 % Gel Commonly known as:  VOLTAREN Apply 2 g topically 4 (four) times daily. To affected hands as needed   simvastatin 20 MG tablet Commonly known as:  ZOCOR Take 1 tablet (20 mg total) by mouth daily at 6 PM.   Vitamin D3 2000 units capsule Take 1 capsule (2,000 Units total) by mouth  daily.       Review of Systems:  Review of Systems  Constitutional: Negative for chills, fever and malaise/fatigue.  HENT: Positive for hearing loss. Negative for congestion.   Eyes: Negative for blurred vision.  Respiratory: Negative for cough and shortness of breath.   Cardiovascular: Positive for leg swelling. Negative for chest pain and palpitations.       Venous insufficiency  Gastrointestinal: Negative for abdominal pain, blood in stool, constipation and melena.  Genitourinary: Positive for urgency. Negative for dysuria.       Usually makes it in time  Musculoskeletal: Positive for joint pain. Negative for falls.  Skin: Negative for itching and rash.  Neurological: Negative for dizziness, loss of consciousness and weakness.  Endo/Heme/Allergies: Bruises/bleeds easily.  Psychiatric/Behavioral:  Positive for memory loss. Negative for depression.    Health Maintenance  Topic Date Due  . ZOSTAVAX  06/07/1984  . TETANUS/TDAP  02/07/2016  . INFLUENZA VACCINE  Completed  . DEXA SCAN  Completed  . PNA vac Low Risk Adult  Completed    Physical Exam: Vitals:   02/28/16 1537  BP: 120/68  Pulse: 89  Temp: 98.4 F (36.9 C)  TempSrc: Oral  SpO2: 96%  Weight: 168 lb (76.2 kg)   Body mass index is 29.76 kg/m. Physical Exam  Constitutional: She is oriented to person, place, and time. She appears well-developed and well-nourished. No distress.  Cardiovascular: Normal rate, regular rhythm, normal heart sounds and intact distal pulses.   Venous insufficiency, mild nonpitting edema, varicose veins  Pulmonary/Chest: Effort normal and breath sounds normal. No respiratory distress.  Abdominal: Soft. Bowel sounds are normal.  Musculoskeletal: Normal range of motion.  Neurological: She is alert and oriented to person, place, and time.  Some mild short term memory loss  Skin: Skin is warm and dry.  Psychiatric: She has a normal mood and affect. Her behavior is normal. Thought content normal.    Labs reviewed: Basic Metabolic Panel:  Recent Labs  07/19/15 1018  NA 143  K 4.8  CL 105  CO2 22  GLUCOSE 95  BUN 30  CREATININE 1.58*  CALCIUM 10.0   Liver Function Tests:  Recent Labs  07/19/15 1018  AST 16  ALT 9  ALKPHOS 85  BILITOT 1.3*  PROT 7.0  ALBUMIN 4.5   No results for input(s): LIPASE, AMYLASE in the last 8760 hours. No results for input(s): AMMONIA in the last 8760 hours. CBC:  Recent Labs  07/19/15 1018  WBC 7.5  NEUTROABS 5.4  HCT 35.8  MCV 80  PLT 225   Lipid Panel:  Recent Labs  07/19/15 1018  CHOL 164  HDL 53  LDLCALC 89  TRIG 110  CHOLHDL 3.1   Lab Results  Component Value Date   HGBA1C 5.4 07/19/2015    Assessment/Plan 1. Osteoarthritis of finger of right hand -try aspercreme, but if not helpful, resume voltaren gel -  diclofenac sodium (VOLTAREN) 1 % GEL; Apply 2 g topically 4 (four) times daily. To affected hands as needed  Dispense: 100 g; Refill: 3  2. Infected sebaceous cyst -s/p abx and improvement, but needs excision due to chance of recurrent infection per ENT--f/u with him when able to have this done  3. Essential hypertension, benign -bp at goal, no changes  4. Chronic kidney disease, stage IV (severe) (HCC) -cont to hydrate and avoid nephrotoxic meds, dose adjust as needed, f/u with Dr. Justin Mend  5. Pure hypercholesterolemia -lipids satisfactory for a 83 yo female with cognitive  losses--goals comfort and quality, happy life  Labs/tests ordered:  No orders of the defined types were placed in this encounter.  Next appt:  06/29/2016  Anelis Hrivnak L. Allina Riches, D.O. [facility] [address], [place] [address] Cell Phone (Mon-Fri 8am-5pm):  [phone] On Call:  [phone] & follow prompts after 5pm & weekends Office Phone:  [phone] Office Fax:  [phone]

## 2016-03-03 DIAGNOSIS — D4989 Neoplasm of unspecified behavior of other specified sites: Secondary | ICD-10-CM | POA: Diagnosis not present

## 2016-03-03 DIAGNOSIS — L723 Sebaceous cyst: Secondary | ICD-10-CM | POA: Diagnosis not present

## 2016-03-03 DIAGNOSIS — L720 Epidermal cyst: Secondary | ICD-10-CM | POA: Diagnosis not present

## 2016-03-06 ENCOUNTER — Other Ambulatory Visit: Payer: Medicare Other

## 2016-03-06 DIAGNOSIS — Z0000 Encounter for general adult medical examination without abnormal findings: Secondary | ICD-10-CM

## 2016-03-06 DIAGNOSIS — E785 Hyperlipidemia, unspecified: Secondary | ICD-10-CM | POA: Diagnosis not present

## 2016-03-06 DIAGNOSIS — I10 Essential (primary) hypertension: Secondary | ICD-10-CM | POA: Diagnosis not present

## 2016-03-06 DIAGNOSIS — N184 Chronic kidney disease, stage 4 (severe): Secondary | ICD-10-CM | POA: Diagnosis not present

## 2016-03-06 LAB — CBC WITH DIFFERENTIAL/PLATELET
Basophils Absolute: 0 cells/uL (ref 0–200)
Basophils Relative: 0 %
Eosinophils Absolute: 77 cells/uL (ref 15–500)
Eosinophils Relative: 1 %
HCT: 36.9 % (ref 35.0–45.0)
Hemoglobin: 12.1 g/dL (ref 11.7–15.5)
Lymphocytes Relative: 13 %
Lymphs Abs: 1001 cells/uL (ref 850–3900)
MCH: 26.5 pg — ABNORMAL LOW (ref 27.0–33.0)
MCHC: 32.8 g/dL (ref 32.0–36.0)
MCV: 80.9 fL (ref 80.0–100.0)
MPV: 10.9 fL (ref 7.5–12.5)
Monocytes Absolute: 539 cells/uL (ref 200–950)
Monocytes Relative: 7 %
Neutro Abs: 6083 cells/uL (ref 1500–7800)
Neutrophils Relative %: 79 %
Platelets: 190 10*3/uL (ref 140–400)
RBC: 4.56 MIL/uL (ref 3.80–5.10)
RDW: 14.4 % (ref 11.0–15.0)
WBC: 7.7 10*3/uL (ref 3.8–10.8)

## 2016-03-06 LAB — COMPREHENSIVE METABOLIC PANEL
ALT: 10 U/L (ref 6–29)
AST: 15 U/L (ref 10–35)
Albumin: 4.2 g/dL (ref 3.6–5.1)
Alkaline Phosphatase: 72 U/L (ref 33–130)
BUN: 21 mg/dL (ref 7–25)
CO2: 23 mmol/L (ref 20–31)
Calcium: 9.9 mg/dL (ref 8.6–10.4)
Chloride: 110 mmol/L (ref 98–110)
Creat: 1.3 mg/dL — ABNORMAL HIGH (ref 0.60–0.88)
Glucose, Bld: 97 mg/dL (ref 65–99)
Potassium: 4.5 mmol/L (ref 3.5–5.3)
Sodium: 142 mmol/L (ref 135–146)
Total Bilirubin: 1.8 mg/dL — ABNORMAL HIGH (ref 0.2–1.2)
Total Protein: 6.9 g/dL (ref 6.1–8.1)

## 2016-03-06 LAB — LIPID PANEL
Cholesterol: 137 mg/dL (ref <200)
HDL: 49 mg/dL — ABNORMAL LOW (ref >50)
LDL Cholesterol: 63 mg/dL (ref <100)
Total CHOL/HDL Ratio: 2.8 Ratio (ref <5.0)
Triglycerides: 127 mg/dL (ref <150)
VLDL: 25 mg/dL (ref <30)

## 2016-03-06 LAB — HEMOGLOBIN A1C
Hgb A1c MFr Bld: 5.4 % (ref <5.7)
Mean Plasma Glucose: 108 mg/dL

## 2016-03-07 ENCOUNTER — Encounter: Payer: Self-pay | Admitting: *Deleted

## 2016-06-05 ENCOUNTER — Ambulatory Visit (INDEPENDENT_AMBULATORY_CARE_PROVIDER_SITE_OTHER): Payer: Medicare Other | Admitting: Nurse Practitioner

## 2016-06-05 ENCOUNTER — Encounter: Payer: Self-pay | Admitting: Nurse Practitioner

## 2016-06-05 VITALS — BP 118/68 | HR 69 | Temp 97.3°F | Resp 17 | Ht 63.0 in | Wt 170.4 lb

## 2016-06-05 DIAGNOSIS — R2231 Localized swelling, mass and lump, right upper limb: Secondary | ICD-10-CM | POA: Diagnosis not present

## 2016-06-05 DIAGNOSIS — M19041 Primary osteoarthritis, right hand: Secondary | ICD-10-CM | POA: Diagnosis not present

## 2016-06-05 LAB — CBC WITH DIFFERENTIAL/PLATELET
BASOS PCT: 0 %
Basophils Absolute: 0 cells/uL (ref 0–200)
EOS ABS: 87 {cells}/uL (ref 15–500)
Eosinophils Relative: 1 %
HEMATOCRIT: 37.2 % (ref 35.0–45.0)
Hemoglobin: 12.1 g/dL (ref 11.7–15.5)
LYMPHS PCT: 16 %
Lymphs Abs: 1392 cells/uL (ref 850–3900)
MCH: 26.5 pg — ABNORMAL LOW (ref 27.0–33.0)
MCHC: 32.5 g/dL (ref 32.0–36.0)
MCV: 81.6 fL (ref 80.0–100.0)
MONO ABS: 696 {cells}/uL (ref 200–950)
MONOS PCT: 8 %
MPV: 10.3 fL (ref 7.5–12.5)
NEUTROS PCT: 75 %
Neutro Abs: 6525 cells/uL (ref 1500–7800)
PLATELETS: 219 10*3/uL (ref 140–400)
RBC: 4.56 MIL/uL (ref 3.80–5.10)
RDW: 14.3 % (ref 11.0–15.0)
WBC: 8.7 10*3/uL (ref 3.8–10.8)

## 2016-06-05 MED ORDER — PREDNISONE 20 MG PO TABS: 20.0000 mg | ORAL_TABLET | Freq: Every day | ORAL | 0 refills | Status: DC

## 2016-06-05 NOTE — Patient Instructions (Signed)
Will test for gout and blood count today To use prednisone twice daily for 6 doses - may start today To notify if redness, swelling or pain increases or if fever occurs.

## 2016-06-05 NOTE — Progress Notes (Signed)
Careteam: Patient Care Team: Gayland Curry, DO as PCP - General (Geriatric Medicine) Edrick Oh, MD as Consulting Physician (Nephrology)  Advanced Directive information Does Patient Have a Medical Advance Directive?: Yes, Type of Advance Directive: [place];Living will;Out of facility DNR (pink MOST or yellow form), Pre-existing out of facility DNR order (yellow form or pink MOST form): Yellow form placed in chart (order not valid for inpatient use)  No Known Allergies  Chief Complaint  Patient presents with  . Acute Visit    Pt is being seen due a red, swollen right ring finger x 1 day. No known injury.      HPI: Patient is a 83 y.o. female seen in the office today due to redness and swelling and heat to ring finger on right hand. No known injury. Was hurting yesterday but not today. Has had this before but does not remember what it was.  No known history of gout but had severe OA in bilateral hands and right knee.  Mo fevers or chills.   Review of Systems:  Review of Systems  Constitutional: Negative for activity change, appetite change and fever.  Musculoskeletal: Positive for arthralgias and joint swelling.  Skin: Positive for color change.   Past Medical History:  Diagnosis Date  . Calculus of kidney   . Diverticulosis of colon (without mention of hemorrhage)   . Dizziness and giddiness   . Edema   . Essential hypertension, benign   . Hyperlipidemia LDL goal < 100   . Other and unspecified hyperlipidemia   . Other B-complex deficiencies   . Other nonspecific abnormal serum enzyme levels   . Other specified disorder of skin   . Pain in joint, lower leg   . Reflux esophagitis   . Senile osteoporosis   . Unspecified disorder of kidney and ureter    Past Surgical History:  Procedure Laterality Date  . EXCISION OF ADNEXAL MASS  2007  . FRACTURE SURGERY N/A 1984   wrist  . GANGLION CYST EXCISION  1974   Social History:   reports that she  has never smoked. She has never used smokeless tobacco. She reports that she does not drink alcohol or use drugs.  Family History  Problem Relation Age of Onset  . Cancer Brother     Medications: Patient's Medications  New Prescriptions   No medications on file  Previous Medications   ASPIRIN EC 81 MG TABLET    Take 81 mg by mouth every other day.    CHOLECALCIFEROL (VITAMIN D3) 2000 UNITS CAPSULE    Take 1 capsule (2,000 Units total) by mouth daily.   DICLOFENAC SODIUM (VOLTAREN) 1 % GEL    Apply 2 g topically 4 (four) times daily. To affected hands as needed   SIMVASTATIN (ZOCOR) 20 MG TABLET    Take 1 tablet (20 mg total) by mouth daily at 6 PM.  Modified Medications   No medications on file  Discontinued Medications   No medications on file     Physical Exam:  Vitals:   06/05/16 1546  BP: 118/68  Pulse: 69  Resp: 17  Temp: 97.3 F (36.3 C)  TempSrc: Oral  SpO2: 95%  Weight: 170 lb 6.4 oz (77.3 kg)  Height: 5\' 3"  (1.6 m)   Body mass index is 30.19 kg/m.  Physical Exam  Constitutional: She is oriented to person, place, and time. She appears well-developed and well-nourished. No distress.  Musculoskeletal: Normal range of motion. She exhibits  tenderness.  Right hand; heberden's and bouchard's nodes; slight warmth and swelling present to ring finger on right hand  Slight tenderness noted to PIP joint of this finger during exam. erythema noted to finger. Pt reports erythema has improved today.   Neurological: She is alert and oriented to person, place, and time.  Psychiatric: She has a normal mood and affect.    Labs reviewed: Basic Metabolic Panel:  Recent Labs  07/19/15 1018 03/06/16 0959  NA 143 142  K 4.8 4.5  CL 105 110  CO2 22 23  GLUCOSE 95 97  BUN 30 21  CREATININE 1.58* 1.30*  CALCIUM 10.0 9.9   Liver Function Tests:  Recent Labs  07/19/15 1018 03/06/16 0959  AST 16 15  ALT 9 10  ALKPHOS 85 72  BILITOT 1.3* 1.8*  PROT 7.0 6.9  ALBUMIN  4.5 4.2   No results for input(s): LIPASE, AMYLASE in the last 8760 hours. No results for input(s): AMMONIA in the last 8760 hours. CBC:  Recent Labs  07/19/15 1018 03/06/16 0959  WBC 7.5 7.7  NEUTROABS 5.4 6,083  HGB  --  12.1  HCT 35.8 36.9  MCV 80 80.9  PLT 225 190   Lipid Panel:  Recent Labs  07/19/15 1018 03/06/16 0959  CHOL 164 137  HDL 53 49*  LDLCALC 89 63  TRIG 110 127  CHOLHDL 3.1 2.8   TSH: No results for input(s): TSH in the last 8760 hours. A1C: Lab Results  Component Value Date   HGBA1C 5.4 03/06/2016     Assessment/Plan 1. Osteoarthritis of finger of right hand Acute flare - predniSONE (DELTASONE) 20 MG tablet; Take 1 tablet (20 mg total) by mouth daily with breakfast.  Dispense: 6 tablet; Refill: 0 - Uric acid - CBC with Differential/Platelets -to notify if persist or worsens.  Carlos American. Harle Battiest  [facility] [phone] 8 am - 5 pm) [phone] (after hours)

## 2016-06-06 ENCOUNTER — Other Ambulatory Visit: Payer: Self-pay

## 2016-06-06 LAB — URIC ACID: Uric Acid, Serum: 9 mg/dL — ABNORMAL HIGH (ref 2.5–7.0)

## 2016-06-06 MED ORDER — ALLOPURINOL 100 MG PO TABS: 100.0000 mg | ORAL_TABLET | Freq: Every day | ORAL | 0 refills | Status: DC

## 2016-06-06 NOTE — Telephone Encounter (Signed)
As per result note on 06/05/16, patient has agreed to try allopurinol 100 mg tablets.   Rx was sent to pharmacy.

## 2016-06-29 ENCOUNTER — Encounter: Payer: Self-pay | Admitting: Internal Medicine

## 2016-06-29 ENCOUNTER — Ambulatory Visit (INDEPENDENT_AMBULATORY_CARE_PROVIDER_SITE_OTHER): Payer: Medicare Other | Admitting: Internal Medicine

## 2016-06-29 VITALS — BP 116/70 | HR 79 | Temp 97.4°F | Ht 63.0 in | Wt 171.4 lb

## 2016-06-29 DIAGNOSIS — N184 Chronic kidney disease, stage 4 (severe): Secondary | ICD-10-CM | POA: Diagnosis not present

## 2016-06-29 DIAGNOSIS — E7800 Pure hypercholesterolemia, unspecified: Secondary | ICD-10-CM | POA: Diagnosis not present

## 2016-06-29 DIAGNOSIS — I10 Essential (primary) hypertension: Secondary | ICD-10-CM

## 2016-06-29 DIAGNOSIS — N2581 Secondary hyperparathyroidism of renal origin: Secondary | ICD-10-CM | POA: Diagnosis not present

## 2016-06-29 DIAGNOSIS — Z23 Encounter for immunization: Secondary | ICD-10-CM

## 2016-06-29 DIAGNOSIS — M10041 Idiopathic gout, right hand: Secondary | ICD-10-CM | POA: Diagnosis not present

## 2016-06-29 LAB — URIC ACID: Uric Acid, Serum: 6.4 mg/dL (ref 2.5–7.0)

## 2016-06-29 MED ORDER — ZOSTER VAC RECOMB ADJUVANTED 50 MCG/0.5ML IM SUSR: 0.5000 mL | Freq: Once | INTRAMUSCULAR | 1 refills | Status: DC

## 2016-06-29 MED ORDER — ZOSTER VAC RECOMB ADJUVANTED 50 MCG/0.5ML IM SUSR
0.5000 mL | Freq: Once | INTRAMUSCULAR | 1 refills | Status: AC
Start: 2016-06-29 — End: 2016-06-29

## 2016-06-29 NOTE — Patient Instructions (Signed)

## 2016-06-29 NOTE — Progress Notes (Signed)
Location:  [facility] clinic Provider:  Lakera Viall L. Mariea Clonts, D.O., C.M.D.  Code Status: DNR Goals of Care:  Advanced Directives 06/29/2016  Does Patient Have a Medical Advance Directive? Yes  Type of Paramedic of [place];Living will  Does patient want to make changes to medical advance directive? -  Copy of [place] in Chart? Yes  Pre-existing out of facility DNR order (yellow form or pink MOST form) -   Chief Complaint  Patient presents with  . Medical Management of Chronic Issues    4 month follow up    HPI: Patient is a 83 y.o. female seen today for medical management of chronic diseases.  She took prednisone taper for 4th finger of right hand gout.  Is now on allopurinol.    Uric acid was 9 three weeks ago.  Goal <6.    Cyst healed up well after excision.  No complications.  Agrees to shingrix vaccinations at pharmacy.    BP at goal w/o meds.    Osteoarthritis:  Doing ok with voltaren gel. Has some generalized aches.  Hyperlipidemia.  On simvastatin and at goal LDL 63.    Past Medical History:  Diagnosis Date  . Calculus of kidney   . Diverticulosis of colon (without mention of hemorrhage)   . Dizziness and giddiness   . Edema   . Essential hypertension, benign   . Hyperlipidemia LDL goal < 100   . Other and unspecified hyperlipidemia   . Other B-complex deficiencies   . Other nonspecific abnormal serum enzyme levels   . Other specified disorder of skin   . Pain in joint, lower leg   . Reflux esophagitis   . Senile osteoporosis   . Unspecified disorder of kidney and ureter     Past Surgical History:  Procedure Laterality Date  . EXCISION OF ADNEXAL MASS  2007  . FRACTURE SURGERY N/A 1984   wrist  . GANGLION CYST EXCISION  1974    No Known Allergies  Allergies as of 06/29/2016   No Known Allergies     Medication List       Accurate as of 06/29/16 10:44 AM. Always use your most recent med list.            allopurinol 100 MG tablet Commonly known as:  ZYLOPRIM Take 1 tablet (100 mg total) by mouth daily.   aspirin EC 81 MG tablet Take 81 mg by mouth every other day.   diclofenac sodium 1 % Gel Commonly known as:  VOLTAREN Apply 2 g topically 4 (four) times daily. To affected hands as needed   simvastatin 20 MG tablet Commonly known as:  ZOCOR Take 1 tablet (20 mg total) by mouth daily at 6 PM.   Vitamin D3 2000 units capsule Take 1 capsule (2,000 Units total) by mouth daily.       Review of Systems:  Review of Systems  Constitutional: Negative for chills, fever and malaise/fatigue.  HENT: Negative for congestion.   Eyes: Negative for blurred vision.  Respiratory: Negative for shortness of breath.   Cardiovascular: Positive for leg swelling. Negative for chest pain and palpitations.  Gastrointestinal: Negative for abdominal pain.  Genitourinary: Negative for dysuria.  Musculoskeletal: Positive for joint pain. Negative for falls and myalgias.       4th finger pain improved, but still red and swollen  Skin: Negative for itching and rash.  Neurological: Negative for dizziness, loss of consciousness and weakness.  Endo/Heme/Allergies: Does not bruise/bleed  easily.  Psychiatric/Behavioral: Positive for memory loss. Negative for depression.    Health Maintenance  Topic Date Due  . TETANUS/TDAP  02/07/2016  . INFLUENZA VACCINE  09/06/2016  . DEXA SCAN  Completed  . PNA vac Low Risk Adult  Completed    Physical Exam: Vitals:   06/29/16 1008  BP: 116/70  Pulse: 79  Temp: 97.4 F (36.3 C)  SpO2: 96%  Weight: 171 lb 6.4 oz (77.7 kg)  Height: 5\' 3"  (1.6 m)   Body mass index is 30.36 kg/m. Physical Exam  Constitutional: She appears well-developed and well-nourished. No distress.  Cardiovascular: Normal rate, regular rhythm, normal heart sounds and intact distal pulses.   Pulmonary/Chest: Effort normal and breath sounds normal. No respiratory distress.  Abdominal:  Soft. Bowel sounds are normal. She exhibits no distension. There is no tenderness.  Musculoskeletal: Normal range of motion. She exhibits no tenderness.  Neurological: She is alert.  Oriented to person and place, poor short term memory  Skin: Skin is warm and dry. There is erythema.  Erythema and swelling remains in right 4th finger, but pain better  Psychiatric: She has a normal mood and affect.    Labs reviewed: Basic Metabolic Panel:  Recent Labs  07/19/15 1018 03/06/16 0959  NA 143 142  K 4.8 4.5  CL 105 110  CO2 22 23  GLUCOSE 95 97  BUN 30 21  CREATININE 1.58* 1.30*  CALCIUM 10.0 9.9   Liver Function Tests:  Recent Labs  07/19/15 1018 03/06/16 0959  AST 16 15  ALT 9 10  ALKPHOS 85 72  BILITOT 1.3* 1.8*  PROT 7.0 6.9  ALBUMIN 4.5 4.2   No results for input(s): LIPASE, AMYLASE in the last 8760 hours. No results for input(s): AMMONIA in the last 8760 hours. CBC:  Recent Labs  07/19/15 1018 03/06/16 0959 06/05/16 1632  WBC 7.5 7.7 8.7  NEUTROABS 5.4 6,083 6,525  HGB  --  12.1 12.1  HCT 35.8 36.9 37.2  MCV 80 80.9 81.6  PLT 225 190 219   Lipid Panel:  Recent Labs  07/19/15 1018 03/06/16 0959  CHOL 164 137  HDL 53 49*  LDLCALC 89 63  TRIG 110 127  CHOLHDL 3.1 2.8   Lab Results  Component Value Date   HGBA1C 5.4 03/06/2016    Assessment/Plan 1. Acute idiopathic gout of right hand - improving but still red and swollen - Uric Acid recheck today -may need to change to uloric from allopurinol if level still more than 6  2. Essential hypertension, benign -bp at goal w/o meds  3. Chronic kidney disease, stage IV (severe) (HCC) -cont to monitor, hydrate, followed by renal  4. Secondary hyperparathyroidism of renal origin ([place]) -cont vitamin D supplement, is followed by renal  5. Pure hypercholesterolemia -lipids at goal with pravachol, cont same  6. Need for shingles vaccine - Zoster Vac Recomb Adjuvanted [facility]) injection; Inject  0.5 mLs into the muscle once.  Dispense: 0.5 mL; Refill: 1--Rx given to get done at [facility]  Labs/tests ordered:   Orders Placed This Encounter  Procedures  . Uric Acid   Next appt:  11/02/2016  Umeka Wrench L. Zhamir Pirro, D.O. [facility] [address], [place] [address] Cell Phone (Mon-Fri 8am-5pm):  [phone] On Call:  [phone] & follow prompts after 5pm & weekends Office Phone:  [phone] Office Fax:  [phone]

## 2016-06-30 ENCOUNTER — Other Ambulatory Visit: Payer: Self-pay | Admitting: *Deleted

## 2016-06-30 MED ORDER — FEBUXOSTAT 40 MG PO TABS: 40.0000 mg | ORAL_TABLET | Freq: Every day | ORAL | 1 refills | Status: DC

## 2016-07-13 ENCOUNTER — Encounter: Payer: Self-pay | Admitting: Internal Medicine

## 2016-07-25 ENCOUNTER — Other Ambulatory Visit: Payer: Self-pay | Admitting: *Deleted

## 2016-07-25 MED ORDER — FEBUXOSTAT 40 MG PO TABS: 40.0000 mg | ORAL_TABLET | Freq: Every day | ORAL | 1 refills | Status: DC

## 2016-07-25 NOTE — Telephone Encounter (Signed)
Ann, Caregiver requested refill to be sent to pharmacy.

## 2016-08-30 ENCOUNTER — Telehealth: Payer: Self-pay

## 2016-08-30 MED ORDER — FEBUXOSTAT 40 MG PO TABS: 40.0000 mg | ORAL_TABLET | Freq: Every day | ORAL | 5 refills | Status: DC

## 2016-08-30 NOTE — Telephone Encounter (Signed)
Patient's daughter is leaving to go out of town tomorrow and needs to know if her mother is to continue Uloric and if yes needs a RX sent to [facility] to pick up by this evening  Emeline Gins would like a call either way to confirm if patient is to continue medication or not, if no answer leave a detailed message on voicemail.  Based on last OV notes and most recent labs patient to take Uloric in place of Allopurionol, [name] is requesting a confirmation from Dr.Reed   Please advise

## 2016-08-30 NOTE — Telephone Encounter (Signed)
RX sent, left detailed message informing Emeline Gins (Patient's daughter)

## 2016-08-30 NOTE — Telephone Encounter (Signed)
Yes, continue uloric 40mg  daily can be sent to [facility].

## 2016-10-25 ENCOUNTER — Other Ambulatory Visit: Payer: Self-pay | Admitting: Internal Medicine

## 2016-10-30 ENCOUNTER — Ambulatory Visit: Payer: Medicare Other | Admitting: Internal Medicine

## 2016-11-02 ENCOUNTER — Ambulatory Visit: Payer: Medicare Other

## 2016-11-02 ENCOUNTER — Encounter: Payer: Medicare Other | Admitting: Internal Medicine

## 2016-11-10 ENCOUNTER — Ambulatory Visit (INDEPENDENT_AMBULATORY_CARE_PROVIDER_SITE_OTHER): Payer: Medicare Other

## 2016-11-10 ENCOUNTER — Ambulatory Visit: Payer: Medicare Other

## 2016-11-10 VITALS — HR 76 | Temp 97.5°F | Ht 63.0 in | Wt 171.0 lb

## 2016-11-10 DIAGNOSIS — Z0000 Encounter for general adult medical examination without abnormal findings: Secondary | ICD-10-CM

## 2016-11-10 DIAGNOSIS — Z23 Encounter for immunization: Secondary | ICD-10-CM

## 2016-11-10 MED ORDER — TETANUS-DIPHTH-ACELL PERTUSSIS 5-2.5-18.5 LF-MCG/0.5 IM SUSP: 0.5000 mL | Freq: Once | INTRAMUSCULAR | 0 refills | Status: AC

## 2016-11-10 NOTE — Patient Instructions (Addendum)
Shannon Berg , Thank you for taking time to come for your Medicare Wellness Visit. I appreciate your ongoing commitment to your health goals. Please review the following plan we discussed and let me know if I can assist you in the future.   Screening recommendations/referrals: Colonoscopy excluded, you are over age 81 Mammogram excluded, you are over age 41 Bone Density up to date Recommended yearly ophthalmology/optometry visit for glaucoma screening and checkup Recommended yearly dental visit for hygiene and checkup  Vaccinations: Influenza vaccine received today Pneumococcal vaccine up to date Tdap vaccine due, prescription sent to pharmacy Shingles vaccine due, prescription is at pharmacy  Advanced directives: In Chart  Conditions/risks identified: None  Next appointment: Dr. Mariea Clonts 11/16/2016 @ 9am, Rich Reining, RN 11/21/2017 @ 9:15am   Preventive Care 34 Years and Older, Female Preventive care refers to lifestyle choices and visits with your health care provider that can promote health and wellness. What does preventive care include?  A yearly physical exam. This is also called an annual well check.  Dental exams once or twice a year.  Routine eye exams. Ask your health care provider how often you should have your eyes checked.  Personal lifestyle choices, including:  Daily care of your teeth and gums.  Regular physical activity.  Eating a healthy diet.  Avoiding tobacco and drug use.  Limiting alcohol use.  Practicing safe sex.  Taking low-dose aspirin every day.  Taking vitamin and mineral supplements as recommended by your health care provider. What happens during an annual well check? The services and screenings done by your health care provider during your annual well check will depend on your age, overall health, lifestyle risk factors, and family history of disease. Counseling  Your health care provider may ask you questions about your:  Alcohol  use.  Tobacco use.  Drug use.  Emotional well-being.  Home and relationship well-being.  Sexual activity.  Eating habits.  History of falls.  Memory and ability to understand (cognition).  Work and work Statistician.  Reproductive health. Screening  You may have the following tests or measurements:  Height, weight, and BMI.  Blood pressure.  Lipid and cholesterol levels. These may be checked every 5 years, or more frequently if you are over 2 years old.  Skin check.  Lung cancer screening. You may have this screening every year starting at age 68 if you have a 30-pack-year history of smoking and currently smoke or have quit within the past 15 years.  Fecal occult blood test (FOBT) of the stool. You may have this test every year starting at age 67.  Flexible sigmoidoscopy or colonoscopy. You may have a sigmoidoscopy every 5 years or a colonoscopy every 10 years starting at age 15.  Hepatitis C blood test.  Hepatitis B blood test.  Sexually transmitted disease (STD) testing.  Diabetes screening. This is done by checking your blood sugar (glucose) after you have not eaten for a while (fasting). You may have this done every 1-3 years.  Bone density scan. This is done to screen for osteoporosis. You may have this done starting at age 91.  Mammogram. This may be done every 1-2 years. Talk to your health care provider about how often you should have regular mammograms. Talk with your health care provider about your test results, treatment options, and if necessary, the need for more tests. Vaccines  Your health care provider may recommend certain vaccines, such as:  Influenza vaccine. This is recommended every year.  Tetanus, diphtheria,  and acellular pertussis (Tdap, Td) vaccine. You may need a Td booster every 10 years.  Zoster vaccine. You may need this after age 81.  Pneumococcal 13-valent conjugate (PCV13) vaccine. One dose is recommended after age  29.  Pneumococcal polysaccharide (PPSV23) vaccine. One dose is recommended after age 35. Talk to your health care provider about which screenings and vaccines you need and how often you need them. This information is not intended to replace advice given to you by your health care provider. Make sure you discuss any questions you have with your health care provider. Document Released: 02/19/2015 Document Revised: 10/13/2015 Document Reviewed: 11/24/2014 Elsevier Interactive Patient Education  2017 [employer] Prevention in the Home Falls can cause injuries. They can happen to people of all ages. There are many things you can do to make your home safe and to help prevent falls. What can I do on the outside of my home?  Regularly fix the edges of walkways and driveways and fix any cracks.  Remove anything that might make you trip as you walk through a door, such as a raised step or threshold.  Trim any bushes or trees on the path to your home.  Use bright outdoor lighting.  Clear any walking paths of anything that might make someone trip, such as rocks or tools.  Regularly check to see if handrails are loose or broken. Make sure that both sides of any steps have handrails.  Any raised decks and porches should have guardrails on the edges.  Have any leaves, snow, or ice cleared regularly.  Use sand or salt on walking paths during winter.  Clean up any spills in your garage right away. This includes oil or grease spills. What can I do in the bathroom?  Use night lights.  Install grab bars by the toilet and in the tub and shower. Do not use towel bars as grab bars.  Use non-skid mats or decals in the tub or shower.  If you need to sit down in the shower, use a plastic, non-slip stool.  Keep the floor dry. Clean up any water that spills on the floor as soon as it happens.  Remove soap buildup in the tub or shower regularly.  Attach bath mats securely with double-sided  non-slip rug tape.  Do not have throw rugs and other things on the floor that can make you trip. What can I do in the bedroom?  Use night lights.  Make sure that you have a light by your bed that is easy to reach.  Do not use any sheets or blankets that are too big for your bed. They should not hang down onto the floor.  Have a firm chair that has side arms. You can use this for support while you get dressed.  Do not have throw rugs and other things on the floor that can make you trip. What can I do in the kitchen?  Clean up any spills right away.  Avoid walking on wet floors.  Keep items that you use a lot in easy-to-reach places.  If you need to reach something above you, use a strong step stool that has a grab bar.  Keep electrical cords out of the way.  Do not use floor polish or wax that makes floors slippery. If you must use wax, use non-skid floor wax.  Do not have throw rugs and other things on the floor that can make you trip. What can I do with my  stairs?  Do not leave any items on the stairs.  Make sure that there are handrails on both sides of the stairs and use them. Fix handrails that are broken or loose. Make sure that handrails are as long as the stairways.  Check any carpeting to make sure that it is firmly attached to the stairs. Fix any carpet that is loose or worn.  Avoid having throw rugs at the top or bottom of the stairs. If you do have throw rugs, attach them to the floor with carpet tape.  Make sure that you have a light switch at the top of the stairs and the bottom of the stairs. If you do not have them, ask someone to add them for you. What else can I do to help prevent falls?  Wear shoes that:  Do not have high heels.  Have rubber bottoms.  Are comfortable and fit you well.  Are closed at the toe. Do not wear sandals.  If you use a stepladder:  Make sure that it is fully opened. Do not climb a closed stepladder.  Make sure that both  sides of the stepladder are locked into place.  Ask someone to hold it for you, if possible.  Clearly mark and make sure that you can see:  Any grab bars or handrails.  First and last steps.  Where the edge of each step is.  Use tools that help you move around (mobility aids) if they are needed. These include:  Canes.  Walkers.  Scooters.  Crutches.  Turn on the lights when you go into a dark area. Replace any light bulbs as soon as they burn out.  Set up your furniture so you have a clear path. Avoid moving your furniture around.  If any of your floors are uneven, fix them.  If there are any pets around you, be aware of where they are.  Review your medicines with your doctor. Some medicines can make you feel dizzy. This can increase your chance of falling. Ask your doctor what other things that you can do to help prevent falls. This information is not intended to replace advice given to you by your health care provider. Make sure you discuss any questions you have with your health care provider. Document Released: 11/19/2008 Document Revised: 07/01/2015 Document Reviewed: 02/27/2014 Elsevier Interactive Patient Education  2017 [employer].

## 2016-11-10 NOTE — Progress Notes (Signed)
Subjective:   Shannon Berg is a 83 y.o. female who presents for Medicare Annual (Subsequent) preventive examination.  Last AWV-10/18/2015    Objective:     Vitals: Pulse 76   Temp (!) 97.5 F (36.4 C) (Oral)   Ht 5\' 3"  (1.6 m)   Wt 171 lb (77.6 kg)   SpO2 96%   BMI 30.29 kg/m   Body mass index is 30.29 kg/m.   Tobacco History  Smoking Status  . Never Smoker  Smokeless Tobacco  . Never Used     Counseling given: Not Answered   Past Medical History:  Diagnosis Date  . Calculus of kidney   . Diverticulosis of colon (without mention of hemorrhage)   . Dizziness and giddiness   . Edema   . Essential hypertension, benign   . Hyperlipidemia LDL goal < 100   . Other and unspecified hyperlipidemia   . Other B-complex deficiencies   . Other nonspecific abnormal serum enzyme levels   . Other specified disorder of skin   . Pain in joint, lower leg   . Reflux esophagitis   . Senile osteoporosis   . Unspecified disorder of kidney and ureter    Past Surgical History:  Procedure Laterality Date  . EXCISION OF ADNEXAL MASS  2007  . FRACTURE SURGERY N/A 1984   wrist  . GANGLION CYST EXCISION  1974   Family History  Problem Relation Age of Onset  . Cancer Brother    History  Sexual Activity  . Sexual activity: Not Currently    Outpatient Encounter Prescriptions as of 11/10/2016  Medication Sig  . aspirin EC 81 MG tablet Take 81 mg by mouth every other day.   . Cholecalciferol (VITAMIN D3) 2000 UNITS capsule Take 1 capsule (2,000 Units total) by mouth daily.  . diclofenac sodium (VOLTAREN) 1 % GEL Apply 2 g topically 4 (four) times daily. To affected hands as needed  . febuxostat (ULORIC) 40 MG tablet Take 1 tablet (40 mg total) by mouth daily.  . simvastatin (ZOCOR) 20 MG tablet Take 1 tablet (20 mg total) by mouth daily at 6 PM.  . [DISCONTINUED] ULORIC 40 MG tablet TAKE 1 TABLET ONCE DAILY.   No facility-administered encounter medications on file as of  11/10/2016.     Activities of Daily Living In your present state of health, do you have any difficulty performing the following activities: 11/10/2016  Hearing? N  Vision? N  Difficulty concentrating or making decisions? Y  Walking or climbing stairs? Y  Dressing or bathing? N  Doing errands, shopping? Y  Preparing Food and eating ? N  Using the Toilet? N  In the past six months, have you accidently leaked urine? N  Do you have problems with loss of bowel control? N  Managing your Medications? N  Managing your Finances? N  Housekeeping or managing your Housekeeping? N  Some recent data might be hidden    Patient Care Team: Gayland Curry, DO as PCP - General (Geriatric Medicine) Edrick Oh, MD as Consulting Physician (Nephrology)    Assessment:     Exercise Activities and Dietary recommendations Current Exercise Habits: Home exercise routine, Type of exercise: walking, Time (Minutes): 20, Frequency (Times/Week): 4, Weekly Exercise (Minutes/Week): 80, Intensity: Mild, Exercise limited by: None identified  Goals    . Increase water intake          Patient will drink more water throughout the day      Fall Risk Fall Risk  11/10/2016 06/05/2016 02/28/2016 01/14/2016 10/18/2015  Falls in the past year? No No No No No  Risk for fall due to : - - - - -  Risk for fall due to: Comment - - - - -   Depression Screen PHQ 2/9 Scores 11/10/2016 02/28/2016 10/18/2015 07/12/2015  PHQ - 2 Score 0 0 0 0     Cognitive Function MMSE - Mini Mental State Exam 11/10/2016 10/18/2015 05/18/2014  Orientation to time 2 2 3   Orientation to Place 3 3 3   Registration 3 3 3   Attention/ Calculation 5 0 1  Recall 0 1 0  Language- name 2 objects 2 2 2   Language- repeat 1 1 1   Language- follow 3 step command 3 1 3   Language- read & follow direction 1 1 1   Write a sentence - 1 0  Copy design - 0 0  Total score - 15 17        Immunization History  Administered Date(s) Administered  .  Influenza,inj,Quad PF,6+ Mos 11/07/2012, 11/21/2013, 10/18/2015  . Influenza-Unspecified 11/04/2009, 11/14/2010, 10/23/2011, 11/12/2014  . Pneumococcal Conjugate-13 05/12/2013  . Pneumococcal Polysaccharide-23 02/06/2006  . Td 02/06/2006   Screening Tests Health Maintenance  Topic Date Due  . TETANUS/TDAP  02/07/2016  . INFLUENZA VACCINE  09/06/2016  . DEXA SCAN  Completed  . PNA vac Low Risk Adult  Completed      Plan:    I have personally reviewed and addressed the Medicare Annual Wellness questionnaire and have noted the following in the patient's chart:  A. Medical and social history B. Use of alcohol, tobacco or illicit drugs  C. Current medications and supplements D. Functional ability and status E.  Nutritional status F.  Physical activity G. Advance directives H. List of other physicians I.  Hospitalizations, surgeries, and ER visits in previous 12 months J.  [place] to include hearing, vision, cognitive, depression L. Referrals and appointments - none  In addition, I have reviewed and discussed with patient certain preventive protocols, quality metrics, and best practice recommendations. A written personalized care plan for preventive services as well as general preventive health recommendations were provided to patient.  See attached scanned questionnaire for additional information.   Signed,   Rich Reining, RN Nurse Health Advisor   Quick Notes   Health Maintenance: TDAP and shingles vaccine at pharmacy. Flu vaccine given today     Abnormal Screen: MMSE 21/30. Did not pass clock drawing     Patient Concerns: Uloric medication review.     Nurse Concerns: None

## 2016-11-12 ENCOUNTER — Other Ambulatory Visit: Payer: Self-pay | Admitting: Internal Medicine

## 2016-11-16 ENCOUNTER — Encounter: Payer: Self-pay | Admitting: Internal Medicine

## 2016-11-16 ENCOUNTER — Ambulatory Visit (INDEPENDENT_AMBULATORY_CARE_PROVIDER_SITE_OTHER): Payer: Medicare Other | Admitting: Internal Medicine

## 2016-11-16 VITALS — BP 130/80 | HR 89 | Temp 98.1°F | Wt 171.0 lb

## 2016-11-16 DIAGNOSIS — E785 Hyperlipidemia, unspecified: Secondary | ICD-10-CM | POA: Diagnosis not present

## 2016-11-16 DIAGNOSIS — I10 Essential (primary) hypertension: Secondary | ICD-10-CM | POA: Diagnosis not present

## 2016-11-16 DIAGNOSIS — Z0000 Encounter for general adult medical examination without abnormal findings: Secondary | ICD-10-CM

## 2016-11-16 DIAGNOSIS — F0150 Vascular dementia without behavioral disturbance: Secondary | ICD-10-CM | POA: Diagnosis not present

## 2016-11-16 DIAGNOSIS — M1711 Unilateral primary osteoarthritis, right knee: Secondary | ICD-10-CM

## 2016-11-16 DIAGNOSIS — N184 Chronic kidney disease, stage 4 (severe): Secondary | ICD-10-CM

## 2016-11-16 DIAGNOSIS — M1A3410 Chronic gout due to renal impairment, right hand, without tophus (tophi): Secondary | ICD-10-CM | POA: Diagnosis not present

## 2016-11-16 DIAGNOSIS — E669 Obesity, unspecified: Secondary | ICD-10-CM

## 2016-11-16 LAB — COMPLETE METABOLIC PANEL WITH GFR
AG Ratio: 1.5 (calc) (ref 1.0–2.5)
ALT: 11 U/L (ref 6–29)
AST: 15 U/L (ref 10–35)
Albumin: 4.1 g/dL (ref 3.6–5.1)
Alkaline phosphatase (APISO): 71 U/L (ref 33–130)
BUN/Creatinine Ratio: 23 (calc) — ABNORMAL HIGH (ref 6–22)
BUN: 31 mg/dL — ABNORMAL HIGH (ref 7–25)
CO2: 26 mmol/L (ref 20–32)
Calcium: 9.9 mg/dL (ref 8.6–10.4)
Chloride: 110 mmol/L (ref 98–110)
Creat: 1.34 mg/dL — ABNORMAL HIGH (ref 0.60–0.88)
GFR, Est African American: 40 mL/min/{1.73_m2} — ABNORMAL LOW (ref >=60)
GFR, Est Non African American: 34 mL/min/{1.73_m2} — ABNORMAL LOW (ref >=60)
Globulin: 2.7 g/dL (calc) (ref 1.9–3.7)
Glucose, Bld: 95 mg/dL (ref 65–99)
Potassium: 4.5 mmol/L (ref 3.5–5.3)
Sodium: 143 mmol/L (ref 135–146)
Total Bilirubin: 1.4 mg/dL — ABNORMAL HIGH (ref 0.2–1.2)
Total Protein: 6.8 g/dL (ref 6.1–8.1)

## 2016-11-16 LAB — LIPID PANEL
Cholesterol: 129 mg/dL (ref <200)
HDL: 56 mg/dL (ref >50)
LDL Cholesterol (Calc): 57 mg/dL (calc)
Non-HDL Cholesterol (Calc): 73 mg/dL (calc) (ref <130)
Total CHOL/HDL Ratio: 2.3 (calc) (ref <5.0)
Triglycerides: 80 mg/dL (ref <150)

## 2016-11-16 LAB — CBC WITH DIFFERENTIAL/PLATELET
Basophils Absolute: 20 cells/uL (ref 0–200)
Basophils Relative: 0.3 %
Eosinophils Absolute: 112 cells/uL (ref 15–500)
Eosinophils Relative: 1.7 %
HCT: 35.1 % (ref 35.0–45.0)
Hemoglobin: 11.3 g/dL — ABNORMAL LOW (ref 11.7–15.5)
Lymphs Abs: 1109 cells/uL (ref 850–3900)
MCH: 25.6 pg — ABNORMAL LOW (ref 27.0–33.0)
MCHC: 32.2 g/dL (ref 32.0–36.0)
MCV: 79.6 fL — ABNORMAL LOW (ref 80.0–100.0)
MPV: 11.6 fL (ref 7.5–12.5)
Monocytes Relative: 7.7 %
Neutro Abs: 4851 cells/uL (ref 1500–7800)
Neutrophils Relative %: 73.5 %
Platelets: 191 10*3/uL (ref 140–400)
RBC: 4.41 10*6/uL (ref 3.80–5.10)
RDW: 13.3 % (ref 11.0–15.0)
Total Lymphocyte: 16.8 %
WBC mixed population: 508 cells/uL (ref 200–950)
WBC: 6.6 10*3/uL (ref 3.8–10.8)

## 2016-11-16 NOTE — Progress Notes (Signed)
Provider:  Rexene Edison. Mariea Clonts, D.O., C.M.D. Location:    [facility]  Place of Service:   clinic  Previous PCP: Gayland Curry, DO Patient Care Team: Gayland Curry, DO as PCP - General (Geriatric Medicine) Edrick Oh, MD as Consulting Physician (Nephrology)  Extended Emergency Contact Information Primary Emergency Contact: Emeline Gins Address: [address]          [place], [place] [address] Johnnette Litter of [place] Phone: [phone] Mobile Phone: [phone] Relation: Daughter  Code Status: DNR Goals of Care: Advanced Directive information Advanced Directives 11/10/2016  Does Patient Have a Medical Advance Directive? Yes  Type of Advance Directive [place]  Does patient want to make changes to medical advance directive? No - Patient declined  Copy of [place] in Chart? Yes  Pre-existing out of facility DNR order (yellow form or pink MOST form) -    Chief Complaint  Patient presents with  . Annual Exam    CPE    HPI: Patient is a 83 y.o. female seen today for an annual physical exam.    She had her AWV with Clarise Cruz and got flu shot then.  She also counseled about getting shingrix--they've been on the wait list for the vaccine at the pharmacy.    No new complaints.    Taking uloric for gout prevention.  No flare-ups since, but has OA, as well.    CKDIV:  Avoiding nsaids.  No recnet difficulty.  Osteoarthritis:  Not bothersome today even in right knee.      HTN:  bp at goal.    She has not had breakfast this am.    Memory doing better than last year per mmse.  Says she feels better than she used to.  Kept spelling world backwards all day after that.  21/30 this year, 15/30.  Failed clock.  No falls.  In good spirits.     Her daughter reports they lost her DNR form and request a new one to be done today.  Past Medical History:  Diagnosis Date  . Calculus of kidney   . Diverticulosis of colon (without mention of hemorrhage)     . Dizziness and giddiness   . Edema   . Essential hypertension, benign   . Hyperlipidemia LDL goal < 100   . Other and unspecified hyperlipidemia   . Other B-complex deficiencies   . Other nonspecific abnormal serum enzyme levels   . Other specified disorder of skin   . Pain in joint, lower leg   . Reflux esophagitis   . Senile osteoporosis   . Unspecified disorder of kidney and ureter    Past Surgical History:  Procedure Laterality Date  . EXCISION OF ADNEXAL MASS  2007  . FRACTURE SURGERY N/A 1984   wrist  . GANGLION CYST EXCISION  1974    reports that she has never smoked. She has never used smokeless tobacco. She reports that she does not drink alcohol or use drugs.  Functional Status Survey:  assistance with meds, appts  Family History  Problem Relation Age of Onset  . Cancer Brother     Health Maintenance  Topic Date Due  . TETANUS/TDAP  02/07/2016  . INFLUENZA VACCINE  Completed  . DEXA SCAN  Completed  . PNA vac Low Risk Adult  Completed    No Known Allergies  Outpatient Encounter Prescriptions as of 11/16/2016  Medication Sig  . aspirin EC 81 MG tablet Take 81 mg by mouth  every other day.   . Cholecalciferol (VITAMIN D3) 2000 UNITS capsule Take 1 capsule (2,000 Units total) by mouth daily.  . diclofenac sodium (VOLTAREN) 1 % GEL Apply 2 g topically 4 (four) times daily. To affected hands as needed  . febuxostat (ULORIC) 40 MG tablet Take 1 tablet (40 mg total) by mouth daily.  . simvastatin (ZOCOR) 20 MG tablet TAKE 1 TABLET ONCE DAILY AT 6 PM.   No facility-administered encounter medications on file as of 11/16/2016.     Review of Systems  Constitutional: Negative for chills, fever and malaise/fatigue.  HENT: Positive for hearing loss. Negative for congestion.   Eyes: Negative for blurred vision.  Cardiovascular: Negative for chest pain, palpitations and leg swelling.  Gastrointestinal: Negative for abdominal pain, blood in stool, constipation,  diarrhea and melena.  Genitourinary: Negative for dysuria.  Musculoskeletal: Negative for falls and joint pain.  Neurological: Positive for weakness. Negative for dizziness and loss of consciousness.  Endo/Heme/Allergies: Bruises/bleeds easily.  Psychiatric/Behavioral: Positive for memory loss. Negative for depression. The patient is not nervous/anxious and does not have insomnia.     Vitals:   11/16/16 0908  BP: 130/80  Pulse: 89  Temp: 98.1 F (36.7 C)  TempSrc: Oral  SpO2: 93%  Weight: 171 lb (77.6 kg)   Body mass index is 30.29 kg/m. Physical Exam  Constitutional: She appears well-developed and well-nourished. No distress.  HENT:  Head: Normocephalic and atraumatic.  Right Ear: External ear normal.  Left Ear: External ear normal.  Nose: Nose normal.  Mouth/Throat: Oropharynx is clear and moist. No oropharyngeal exudate.  Eyes: Pupils are equal, round, and reactive to light. Conjunctivae and EOM are normal.  Neck: Neck supple. No JVD present.  Cardiovascular: Normal rate, regular rhythm, normal heart sounds and intact distal pulses.   Pulmonary/Chest: Effort normal and breath sounds normal.  Abdominal: Soft. Bowel sounds are normal. She exhibits no distension. There is no tenderness.  Musculoskeletal: Normal range of motion.  Lymphadenopathy:    She has no cervical adenopathy.  Neurological: She is alert.  Oriented to person, place, not time  Skin: Skin is warm and dry. There is pallor.  Psychiatric: She has a normal mood and affect.    Labs reviewed: Basic Metabolic Panel:  Recent Labs  03/06/16 0959  NA 142  K 4.5  CL 110  CO2 23  GLUCOSE 97  BUN 21  CREATININE 1.30*  CALCIUM 9.9   Liver Function Tests:  Recent Labs  03/06/16 0959  AST 15  ALT 10  ALKPHOS 72  BILITOT 1.8*  PROT 6.9  ALBUMIN 4.2   No results for input(s): LIPASE, AMYLASE in the last 8760 hours. No results for input(s): AMMONIA in the last 8760 hours. CBC:  Recent Labs   03/06/16 0959 06/05/16 1632  WBC 7.7 8.7  NEUTROABS 6,083 6,525  HGB 12.1 12.1  HCT 36.9 37.2  MCV 80.9 81.6  PLT 190 219   Cardiac Enzymes: No results for input(s): CKTOTAL, CKMB, CKMBINDEX, TROPONINI in the last 8760 hours. BNP: Invalid input(s): POCBNP Lab Results  Component Value Date   HGBA1C 5.4 03/06/2016   No results found for: TSH Lab Results  Component Value Date   VITAMINB12 559 05/08/2013   Lab Results  Component Value Date   FOLATE 12.8 05/08/2013   Assessment/Plan 1. Annual physical exam -performed today -already arranged to get shingrix, had flu shot at [facility] not attempt resuscitation (DNR) - COMPLETE METABOLIC PANEL WITH GFR - CBC with Differential/Platelet -  Lipid panel  2. Essential hypertension, benign -bp at goal on current therapy - Do not attempt resuscitation (DNR) - COMPLETE METABOLIC PANEL WITH GFR  3. Primary osteoarthritis of right knee -not bothersome recently, walks with rollator walker - Do not attempt resuscitation (DNR)  4. Chronic kidney disease, stage IV (severe) (HCC) -stable lately Avoid nephrotoxic agents like nsaids, dose adjust renally excreted meds, hydrate. - Do not attempt resuscitation (DNR) - COMPLETE METABOLIC PANEL WITH GFR - CBC with Differential/Platelet  5. Hyperlipidemia, unspecified hyperlipidemia type - cont statin therapy, f/u lab - Do not attempt resuscitation (DNR) - Lipid panel  6. Obesity (BMI 30.0-34.9) - pt doing well at her age, not concerned about her weight at 1 with dementia - Do not attempt resuscitation (DNR)  7. 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 Vascular dementia without behavioral disturbance -not on dementia meds, doing well in her [place] environment with support from her daughter - Do not attempt resuscitation (DNR)  Labs/tests ordered:   Orders Placed This Encounter    Procedures  . COMPLETE METABOLIC PANEL WITH GFR  . CBC with Differential/Platelet  . Lipid panel  . Do not attempt resuscitation (DNR)    Discussed at clinic visit, scanned copy should be in documents and media    Order Specific Question:   In the event of cardiac or respiratory ARREST    Answer:   Do not call a "code blue"    Order Specific Question:   In the event of cardiac or respiratory ARREST    Answer:   Do not perform Intubation, CPR, defibrillation or ACLS    Order Specific Question:   In the event of cardiac or respiratory ARREST    Answer:   Use medication by any route, position, wound care, and other measures to relive pain and suffering. May use oxygen, suction and manual treatment of airway obstruction as needed for comfort.    Paz Winsett L. Dhanya Bogle, D.O. [facility] [address], [place] [address] Cell Phone (Mon-Fri 8am-5pm):  [phone] On Call:  [phone] & follow prompts after 5pm & weekends Office Phone:  [phone] Office Fax:  [phone]

## 2016-11-27 DIAGNOSIS — H5212 Myopia, left eye: Secondary | ICD-10-CM | POA: Diagnosis not present

## 2016-11-27 DIAGNOSIS — Z961 Presence of intraocular lens: Secondary | ICD-10-CM | POA: Diagnosis not present

## 2016-11-27 DIAGNOSIS — H35342 Macular cyst, hole, or pseudohole, left eye: Secondary | ICD-10-CM | POA: Diagnosis not present

## 2016-11-27 DIAGNOSIS — H524 Presbyopia: Secondary | ICD-10-CM | POA: Diagnosis not present

## 2017-03-28 ENCOUNTER — Other Ambulatory Visit: Payer: Self-pay | Admitting: *Deleted

## 2017-03-28 ENCOUNTER — Other Ambulatory Visit: Payer: Self-pay | Admitting: Internal Medicine

## 2017-03-28 MED ORDER — SIMVASTATIN 10 MG PO TABS: 10.0000 mg | ORAL_TABLET | Freq: Every day | ORAL | 0 refills | Status: DC

## 2017-05-23 ENCOUNTER — Other Ambulatory Visit: Payer: Self-pay | Admitting: Internal Medicine

## 2017-05-23 DIAGNOSIS — R42 Dizziness and giddiness: Secondary | ICD-10-CM

## 2017-05-23 NOTE — Telephone Encounter (Signed)
Spoke with patient's daughter, scheduled appointment to follow-up with Dr.Reed for 06/14/17  Patient would like to know if she can have a rx for Meclizine in the event that she has a dizzy spell, patient took this medication in the past. Patient has not had a dizzy spell within the last year, patient's daughter noticed that her bottle was empty. Please advise if ok to add to list and fill  Patient has taken Uloric continually, [name] is not sure why it does not show that in our system. [name] has receipts from [facility] that confirms patient picking up rx monthly although our records indicate she would have ran out in December 2018 or Jan 2019. RX sent to pharmacy.

## 2017-05-23 NOTE — Telephone Encounter (Signed)
Left message on voicemail for patient's daughter [name] to return call when available    Reason for call:  1.) Patient was due for a follow-up in Feb 2019, need to schedule appointment  2.) Meclizine is not on current medication list, question if patient requested, if yes will need to send to PCP for approval.   3.) Question if patient off Uloric, according to refill history in Epic patient not taking medication since Jan 2019   S.Chrae B/CMA

## 2017-05-24 NOTE — Telephone Encounter (Signed)
I sent the meclizine since it's rarely used.  As far as uloric, I think our numbers don't match up b/c of giving samples initially; however, Clydean does have memory loss so she could possibly miss pills.  We'll discuss at her appt.

## 2017-06-14 ENCOUNTER — Encounter: Payer: Self-pay | Admitting: Internal Medicine

## 2017-06-14 ENCOUNTER — Ambulatory Visit (INDEPENDENT_AMBULATORY_CARE_PROVIDER_SITE_OTHER): Payer: Medicare Other | Admitting: Internal Medicine

## 2017-06-14 VITALS — BP 122/70 | HR 90 | Ht 63.0 in | Wt 167.0 lb

## 2017-06-14 DIAGNOSIS — M1A3410 Chronic gout due to renal impairment, right hand, without tophus (tophi): Secondary | ICD-10-CM | POA: Diagnosis not present

## 2017-06-14 DIAGNOSIS — N184 Chronic kidney disease, stage 4 (severe): Secondary | ICD-10-CM

## 2017-06-14 DIAGNOSIS — Z23 Encounter for immunization: Secondary | ICD-10-CM | POA: Diagnosis not present

## 2017-06-14 DIAGNOSIS — F0150 Vascular dementia without behavioral disturbance: Secondary | ICD-10-CM

## 2017-06-14 DIAGNOSIS — E785 Hyperlipidemia, unspecified: Secondary | ICD-10-CM

## 2017-06-14 DIAGNOSIS — Z6829 Body mass index (BMI) 29.0-29.9, adult: Secondary | ICD-10-CM | POA: Diagnosis not present

## 2017-06-14 DIAGNOSIS — I10 Essential (primary) hypertension: Secondary | ICD-10-CM

## 2017-06-14 LAB — CBC WITH DIFFERENTIAL/PLATELET
Basophils Absolute: 30 cells/uL (ref 0–200)
Basophils Relative: 0.4 %
Eosinophils Absolute: 133 cells/uL (ref 15–500)
Eosinophils Relative: 1.8 %
HCT: 35.3 % (ref 35.0–45.0)
Hemoglobin: 11.7 g/dL (ref 11.7–15.5)
Lymphs Abs: 1288 cells/uL (ref 850–3900)
MCH: 26.6 pg — ABNORMAL LOW (ref 27.0–33.0)
MCHC: 33.1 g/dL (ref 32.0–36.0)
MCV: 80.2 fL (ref 80.0–100.0)
MPV: 10.6 fL (ref 7.5–12.5)
Monocytes Relative: 8.8 %
Neutro Abs: 5298 cells/uL (ref 1500–7800)
Neutrophils Relative %: 71.6 %
Platelets: 208 10*3/uL (ref 140–400)
RBC: 4.4 10*6/uL (ref 3.80–5.10)
RDW: 13 % (ref 11.0–15.0)
Total Lymphocyte: 17.4 %
WBC mixed population: 651 cells/uL (ref 200–950)
WBC: 7.4 10*3/uL (ref 3.8–10.8)

## 2017-06-14 LAB — BASIC METABOLIC PANEL
BUN/Creatinine Ratio: 18 (calc) (ref 6–22)
BUN: 28 mg/dL — ABNORMAL HIGH (ref 7–25)
CO2: 27 mmol/L (ref 20–32)
Calcium: 9.8 mg/dL (ref 8.6–10.4)
Chloride: 109 mmol/L (ref 98–110)
Creat: 1.52 mg/dL — ABNORMAL HIGH (ref 0.60–0.88)
Glucose, Bld: 126 mg/dL (ref 65–139)
Potassium: 4.1 mmol/L (ref 3.5–5.3)
Sodium: 144 mmol/L (ref 135–146)

## 2017-06-14 LAB — LIPID PANEL
Cholesterol: 146 mg/dL (ref <200)
HDL: 50 mg/dL — ABNORMAL LOW (ref >50)
LDL Cholesterol (Calc): 74 mg/dL (calc)
Non-HDL Cholesterol (Calc): 96 mg/dL (calc) (ref <130)
Total CHOL/HDL Ratio: 2.9 (calc) (ref <5.0)
Triglycerides: 138 mg/dL (ref <150)

## 2017-06-14 MED ORDER — TETANUS-DIPHTH-ACELL PERTUSSIS 5-2-15.5 LF-MCG/0.5 IM SUSP: 0.5000 mL | Freq: Once | INTRAMUSCULAR | 0 refills | Status: AC

## 2017-06-14 NOTE — Progress Notes (Signed)
Location:  [facility] clinic Provider:  Amneet Cendejas L. Mariea Clonts, D.O., C.M.D.  Code Status: DNR Goals of Care:  Advanced Directives 06/14/2017  Does Patient Have a Medical Advance Directive? Yes  Type of Paramedic of Sammy Martinez;Living will;Out of facility DNR (pink MOST or yellow form)  Does patient want to make changes to medical advance directive? No - Patient declined  Copy of [place] in Chart? Yes  Pre-existing out of facility DNR order (yellow form or pink MOST form) Yellow form placed in chart (order not valid for inpatient use);Pink MOST form placed in chart (order not valid for inpatient use)   Chief Complaint  Patient presents with  . Medical Management of Chronic Issues    29mth follow-up    HPI: Patient is a 84 y.o. female seen today for medical management of chronic diseases.    She got her first shingles shot 04/26/17.     HTN:  bp at goal.    Memory comes and goes at times.  She does well for the most part.  Occasionally mixes things up.  She gets to places for things.    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    She's not hurting any.  She does not eat as much as she used to, but she does eat a good meal and loves her sweets.    She's had no dizzy spells to need the meclizine.    She is on uloric for her gouty arthritis.  No flares.    Hyperlipidemia:  Takes her simvastatin and taking her meds overall as directed.    Past Medical History:  Diagnosis Date  . Calculus of kidney   . Diverticulosis of colon (without mention of hemorrhage)   . Dizziness and giddiness   . Edema   . Essential hypertension, benign   . Hyperlipidemia LDL goal < 100   . Other and unspecified hyperlipidemia   . Other B-complex deficiencies   . Other nonspecific abnormal serum enzyme levels   . Other specified disorder of skin   . Pain in joint, lower leg   . Reflux esophagitis   . Senile osteoporosis   . Unspecified disorder of kidney and  ureter     Past Surgical History:  Procedure Laterality Date  . EXCISION OF ADNEXAL MASS  2007  . FRACTURE SURGERY N/A 1984   wrist  . GANGLION CYST EXCISION  1974    No Known Allergies  Outpatient Encounter Medications as of 06/14/2017  Medication Sig  . aspirin EC 81 MG tablet Take 81 mg by mouth every other day.   . Cholecalciferol (VITAMIN D3) 2000 UNITS capsule Take 1 capsule (2,000 Units total) by mouth daily.  . diclofenac sodium (VOLTAREN) 1 % GEL Apply 2 g topically 4 (four) times daily. To affected hands as needed  . simvastatin (ZOCOR) 10 MG tablet Take 1 tablet (10 mg total) by mouth daily.  . TRAVEL SICKNESS 25 MG CHEW TAKE 1/2 TABLET THREE TIMES DAILY AS NEEDED FOR DIZZINESS.  Marland Kitchen ULORIC 40 MG tablet TAKE 1 TABLET ONCE DAILY.   No facility-administered encounter medications on file as of 06/14/2017.     Review of Systems:  Review of Systems  Constitutional: Negative for chills, diaphoresis, fever and malaise/fatigue.  HENT: Negative for congestion.   Eyes: Negative for blurred vision.  Respiratory: Negative for cough and shortness of breath.   Cardiovascular: Negative for chest pain, palpitations and leg swelling.  Gastrointestinal: Negative for abdominal  pain, blood in stool, constipation, diarrhea, heartburn and melena.  Genitourinary: Negative for dysuria.  Musculoskeletal: Negative for falls and joint pain.  Neurological: Negative for dizziness and loss of consciousness.  Endo/Heme/Allergies: Does not bruise/bleed easily.  Psychiatric/Behavioral: Positive for memory loss. Negative for depression. The patient is not nervous/anxious and does not have insomnia.     Health Maintenance  Topic Date Due  . TETANUS/TDAP  02/07/2016  . INFLUENZA VACCINE  09/06/2017  . DEXA SCAN  Completed  . PNA vac Low Risk Adult  Completed    Physical Exam: Vitals:   06/14/17 1513  BP: 122/70  Pulse: 90  SpO2: 95%  Weight: 167 lb (75.8 kg)  Height: 5\' 3"  (1.6 m)   Body  mass index is 29.58 kg/m. Physical Exam  Constitutional: She is oriented to person, place, and time. She appears well-developed and well-nourished. No distress.  Cardiovascular: Normal rate, regular rhythm, normal heart sounds and intact distal pulses.  Pulmonary/Chest: Effort normal and breath sounds normal. No respiratory distress.  Abdominal: Bowel sounds are normal.  Musculoskeletal: Normal range of motion.  Walks with rollator walker  Neurological: She is alert and oriented to person, place, and time.  But poor historian, responds that she's fine, ok to everything   Skin: Skin is warm and dry. Capillary refill takes less than 2 seconds.  Psychiatric: She has a normal mood and affect.    Labs reviewed: Basic Metabolic Panel: Recent Labs    11/16/16 1028  NA 143  K 4.5  CL 110  CO2 26  GLUCOSE 95  BUN 31*  CREATININE 1.34*  CALCIUM 9.9   Liver Function Tests: Recent Labs    11/16/16 1028  AST 15  ALT 11  BILITOT 1.4*  PROT 6.8   No results for input(s): LIPASE, AMYLASE in the last 8760 hours. No results for input(s): AMMONIA in the last 8760 hours. CBC: Recent Labs    11/16/16 1028  WBC 6.6  NEUTROABS 4,851  HGB 11.3*  HCT 35.1  MCV 79.6*  PLT 191   Lipid Panel: Recent Labs    11/16/16 1028  CHOL 129  HDL 56  LDLCALC 57  TRIG 80  CHOLHDL 2.3   Lab Results  Component Value Date   HGBA1C 5.4 03/06/2016    Assessment/Plan 1. Essential hypertension, benign -bp well controlled with current regimen, no dizziness  2. Vascular dementia without behavioral disturbance - cont supportive care by family and am safety checks at [facility] metabolic panel - Lipid panel  3. Chronic kidney disease, stage IV (severe) (HCC) - Avoid nephrotoxic agents like nsaids, dose adjust renally excreted meds, hydrate. - Basic metabolic panel - CBC with Differential/Platelet  4. Hyperlipidemia, unspecified hyperlipidemia type - f/u labs, continues on zocor  just 10mg --if cholesterol is great, would d/c - Lipid panel  5. Chronic gout of right hand due to renal impairment without tophus -cont uloric therapy, no flares since starting  6. Body mass index 29.0-29.9, adult - overweight -pt losing weight though due to dementia progressing and sweets are only things that really taste great to her--still eats better than most colleagues - Basic metabolic panel - Lipid panel  7. Need for Tdap vaccination - Tdap (ADACEL) 06-07-13.5 LF-MCG/0.5 injection; Inject 0.5 mLs into the muscle once for 1 dose.  Dispense: 0.5 mL; Refill: 0 sent to pharmacy  Labs/tests ordered:   Orders Placed This Encounter  Procedures  . Basic metabolic panel  . Lipid panel  . CBC with Differential/Platelet  Next appt:  11/21/2017 for AWV, f/u 6 mos for CPE with me  Mende Biswell L. Jadasia Haws, D.O. [facility] [address], [place] [address] Cell Phone (Mon-Fri 8am-5pm):  [phone] On Call:  [phone] & follow prompts after 5pm & weekends Office Phone:  [phone] Office Fax:  [phone]

## 2017-06-15 ENCOUNTER — Other Ambulatory Visit: Payer: Self-pay | Admitting: *Deleted

## 2017-06-15 MED ORDER — TETANUS-DIPHTH-ACELL PERTUSSIS 5-2.5-18.5 LF-MCG/0.5 IM SUSP: 0.5000 mL | Freq: Once | INTRAMUSCULAR | 0 refills | Status: AC

## 2017-06-27 ENCOUNTER — Other Ambulatory Visit: Payer: Self-pay | Admitting: Internal Medicine

## 2017-07-26 ENCOUNTER — Other Ambulatory Visit: Payer: Self-pay | Admitting: Internal Medicine

## 2017-07-26 NOTE — Telephone Encounter (Signed)
A medication refill was received from pharmacy for uloric 40 mg. Rx was sent to pharmacy electronically.

## 2017-08-22 ENCOUNTER — Other Ambulatory Visit: Payer: Self-pay | Admitting: Internal Medicine

## 2017-08-22 DIAGNOSIS — M1A0410 Idiopathic chronic gout, right hand, without tophus (tophi): Secondary | ICD-10-CM

## 2017-08-23 ENCOUNTER — Telehealth: Payer: Self-pay | Admitting: *Deleted

## 2017-08-23 NOTE — Telephone Encounter (Signed)
Received fax from [name] is APPROVED through 02/05/2018

## 2017-08-23 NOTE — Telephone Encounter (Signed)
Patient daughter, [name] called and stated that the Insurance denied patient's refill for Uloric 40mg .  Initiated Prior Authorization through [facility].  KeyThereasa Solo #: [id number] PCN: [id number] ID: [id number] [id number]  Awaiting determination. Daughter aware.

## 2017-08-27 ENCOUNTER — Telehealth: Payer: Self-pay | Admitting: *Deleted

## 2017-08-27 NOTE — Telephone Encounter (Signed)
Patient daughter, [name] called and stated that patient's insurance has placed patient's Uloric in the highest tier and it is now doubled in price costing them $95/month. Stated a prior authorization was done and insurance covered but the tier is too high. Daughter is wondering if there is an alternative or if patient has to take this medication everyday. Please Advise.

## 2017-08-27 NOTE — Telephone Encounter (Signed)
She can go back on allopurinol 100mg  po daily.  We will need to monitor her kidneys on this and uric acid level. Please send it in for her.

## 2017-08-30 MED ORDER — ALLOPURINOL 100 MG PO TABS: 100.0000 mg | ORAL_TABLET | Freq: Every day | ORAL | 0 refills | Status: AC

## 2017-09-19 ENCOUNTER — Other Ambulatory Visit: Payer: Self-pay | Admitting: Internal Medicine

## 2017-10-23 ENCOUNTER — Emergency Department (HOSPITAL_COMMUNITY): Payer: Medicare Other

## 2017-10-23 ENCOUNTER — Other Ambulatory Visit: Payer: Self-pay

## 2017-10-23 ENCOUNTER — Encounter (HOSPITAL_COMMUNITY): Payer: Self-pay

## 2017-10-23 ENCOUNTER — Inpatient Hospital Stay (HOSPITAL_COMMUNITY)
Admission: EM | Admit: 2017-10-23 | Discharge: 2017-10-26 | DRG: 183 | Disposition: A | Discharge status: Skilled Nursing Facility | Payer: Medicare Other | Attending: General Surgery | Admitting: General Surgery

## 2017-10-23 DIAGNOSIS — S2242XA Multiple fractures of ribs, left side, initial encounter for closed fracture: Secondary | ICD-10-CM | POA: Diagnosis not present

## 2017-10-23 DIAGNOSIS — S62501A Fracture of unspecified phalanx of right thumb, initial encounter for closed fracture: Secondary | ICD-10-CM | POA: Diagnosis present

## 2017-10-23 DIAGNOSIS — S199XXA Unspecified injury of neck, initial encounter: Secondary | ICD-10-CM | POA: Diagnosis not present

## 2017-10-23 DIAGNOSIS — E041 Nontoxic single thyroid nodule: Secondary | ICD-10-CM | POA: Diagnosis not present

## 2017-10-23 DIAGNOSIS — M810 Age-related osteoporosis without current pathological fracture: Secondary | ICD-10-CM | POA: Diagnosis present

## 2017-10-23 DIAGNOSIS — N2889 Other specified disorders of kidney and ureter: Secondary | ICD-10-CM | POA: Diagnosis not present

## 2017-10-23 DIAGNOSIS — W19XXXA Unspecified fall, initial encounter: Secondary | ICD-10-CM | POA: Diagnosis not present

## 2017-10-23 DIAGNOSIS — Z7982 Long term (current) use of aspirin: Secondary | ICD-10-CM | POA: Diagnosis not present

## 2017-10-23 DIAGNOSIS — S2242XD Multiple fractures of ribs, left side, subsequent encounter for fracture with routine healing: Secondary | ICD-10-CM | POA: Diagnosis not present

## 2017-10-23 DIAGNOSIS — H9190 Unspecified hearing loss, unspecified ear: Secondary | ICD-10-CM | POA: Diagnosis not present

## 2017-10-23 DIAGNOSIS — J942 Hemothorax: Secondary | ICD-10-CM | POA: Diagnosis not present

## 2017-10-23 DIAGNOSIS — S271XXA Traumatic hemothorax, initial encounter: Secondary | ICD-10-CM | POA: Diagnosis present

## 2017-10-23 DIAGNOSIS — N179 Acute kidney failure, unspecified: Secondary | ICD-10-CM | POA: Diagnosis present

## 2017-10-23 DIAGNOSIS — Y92039 Unspecified place in apartment as the place of occurrence of the external cause: Secondary | ICD-10-CM

## 2017-10-23 DIAGNOSIS — F05 Delirium due to known physiological condition: Secondary | ICD-10-CM | POA: Diagnosis not present

## 2017-10-23 DIAGNOSIS — I129 Hypertensive chronic kidney disease with stage 1 through stage 4 chronic kidney disease, or unspecified chronic kidney disease: Secondary | ICD-10-CM | POA: Diagnosis not present

## 2017-10-23 DIAGNOSIS — S0990XA Unspecified injury of head, initial encounter: Secondary | ICD-10-CM | POA: Diagnosis not present

## 2017-10-23 DIAGNOSIS — Z87442 Personal history of urinary calculi: Secondary | ICD-10-CM

## 2017-10-23 DIAGNOSIS — S20212A Contusion of left front wall of thorax, initial encounter: Secondary | ICD-10-CM | POA: Diagnosis not present

## 2017-10-23 DIAGNOSIS — R2689 Other abnormalities of gait and mobility: Secondary | ICD-10-CM | POA: Diagnosis not present

## 2017-10-23 DIAGNOSIS — E785 Hyperlipidemia, unspecified: Secondary | ICD-10-CM | POA: Diagnosis present

## 2017-10-23 DIAGNOSIS — M6281 Muscle weakness (generalized): Secondary | ICD-10-CM | POA: Diagnosis not present

## 2017-10-23 DIAGNOSIS — N390 Urinary tract infection, site not specified: Secondary | ICD-10-CM | POA: Diagnosis not present

## 2017-10-23 DIAGNOSIS — S3991XA Unspecified injury of abdomen, initial encounter: Secondary | ICD-10-CM | POA: Diagnosis not present

## 2017-10-23 DIAGNOSIS — M1A3410 Chronic gout due to renal impairment, right hand, without tophus (tophi): Secondary | ICD-10-CM | POA: Diagnosis present

## 2017-10-23 DIAGNOSIS — D638 Anemia in other chronic diseases classified elsewhere: Secondary | ICD-10-CM | POA: Diagnosis not present

## 2017-10-23 DIAGNOSIS — S20229A Contusion of unspecified back wall of thorax, initial encounter: Secondary | ICD-10-CM | POA: Diagnosis present

## 2017-10-23 DIAGNOSIS — J90 Pleural effusion, not elsewhere classified: Secondary | ICD-10-CM

## 2017-10-23 DIAGNOSIS — N184 Chronic kidney disease, stage 4 (severe): Secondary | ICD-10-CM | POA: Diagnosis present

## 2017-10-23 DIAGNOSIS — S20212S Contusion of left front wall of thorax, sequela: Secondary | ICD-10-CM | POA: Diagnosis not present

## 2017-10-23 DIAGNOSIS — R609 Edema, unspecified: Secondary | ICD-10-CM | POA: Diagnosis not present

## 2017-10-23 DIAGNOSIS — R1032 Left lower quadrant pain: Secondary | ICD-10-CM | POA: Diagnosis not present

## 2017-10-23 DIAGNOSIS — F0150 Vascular dementia without behavioral disturbance: Secondary | ICD-10-CM | POA: Diagnosis present

## 2017-10-23 DIAGNOSIS — J9811 Atelectasis: Secondary | ICD-10-CM | POA: Diagnosis not present

## 2017-10-23 DIAGNOSIS — Z9181 History of falling: Secondary | ICD-10-CM | POA: Diagnosis not present

## 2017-10-23 DIAGNOSIS — S299XXA Unspecified injury of thorax, initial encounter: Secondary | ICD-10-CM | POA: Diagnosis not present

## 2017-10-23 DIAGNOSIS — M1990 Unspecified osteoarthritis, unspecified site: Secondary | ICD-10-CM | POA: Diagnosis not present

## 2017-10-23 DIAGNOSIS — S62201A Unspecified fracture of first metacarpal bone, right hand, initial encounter for closed fracture: Secondary | ICD-10-CM | POA: Diagnosis present

## 2017-10-23 DIAGNOSIS — K5730 Diverticulosis of large intestine without perforation or abscess without bleeding: Secondary | ICD-10-CM | POA: Diagnosis not present

## 2017-10-23 DIAGNOSIS — S60011A Contusion of right thumb without damage to nail, initial encounter: Secondary | ICD-10-CM | POA: Diagnosis not present

## 2017-10-23 DIAGNOSIS — R0902 Hypoxemia: Secondary | ICD-10-CM | POA: Diagnosis not present

## 2017-10-23 LAB — URINALYSIS, ROUTINE W REFLEX MICROSCOPIC
Bacteria, UA: NONE SEEN
Bilirubin Urine: NEGATIVE
GLUCOSE, UA: NEGATIVE mg/dL
HGB URINE DIPSTICK: NEGATIVE
Ketones, ur: NEGATIVE mg/dL
Nitrite: NEGATIVE
Protein, ur: NEGATIVE mg/dL
Specific Gravity, Urine: 1.028 (ref 1.005–1.030)
pH: 5 (ref 5.0–8.0)

## 2017-10-23 LAB — CBC WITH DIFFERENTIAL/PLATELET
BASOS PCT: 0 %
Basophils Absolute: 0 10*3/uL (ref 0.0–0.1)
EOS ABS: 0 10*3/uL (ref 0.0–0.7)
EOS PCT: 1 %
HCT: 36 % (ref 36.0–46.0)
Hemoglobin: 11.4 g/dL — ABNORMAL LOW (ref 12.0–15.0)
LYMPHS ABS: 0.8 10*3/uL (ref 0.7–4.0)
Lymphocytes Relative: 9 %
MCH: 27 pg (ref 26.0–34.0)
MCHC: 31.7 g/dL (ref 30.0–36.0)
MCV: 85.3 fL (ref 78.0–100.0)
Monocytes Absolute: 0.6 10*3/uL (ref 0.1–1.0)
Monocytes Relative: 7 %
NEUTROS PCT: 83 %
Neutro Abs: 6.8 10*3/uL (ref 1.7–7.7)
PLATELETS: 174 10*3/uL (ref 150–400)
RBC: 4.22 MIL/uL (ref 3.87–5.11)
RDW: 14.4 % (ref 11.5–15.5)
WBC: 8.2 10*3/uL (ref 4.0–10.5)

## 2017-10-23 LAB — I-STAT CHEM 8, ED
BUN: 32 mg/dL — ABNORMAL HIGH (ref 8–23)
CALCIUM ION: 1.27 mmol/L (ref 1.15–1.40)
CHLORIDE: 110 mmol/L (ref 98–111)
Creatinine, Ser: 1.5 mg/dL — ABNORMAL HIGH (ref 0.44–1.00)
Glucose, Bld: 102 mg/dL — ABNORMAL HIGH (ref 70–99)
HEMATOCRIT: 33 % — AB (ref 36.0–46.0)
Hemoglobin: 11.2 g/dL — ABNORMAL LOW (ref 12.0–15.0)
Potassium: 4.4 mmol/L (ref 3.5–5.1)
SODIUM: 140 mmol/L (ref 135–145)
TCO2: 24 mmol/L (ref 22–32)

## 2017-10-23 LAB — I-STAT TROPONIN, ED: Troponin i, poc: 0 ng/mL (ref 0.00–0.08)

## 2017-10-23 LAB — CBC
HCT: 37.7 % (ref 36.0–46.0)
Hemoglobin: 11.7 g/dL — ABNORMAL LOW (ref 12.0–15.0)
MCH: 26.7 pg (ref 26.0–34.0)
MCHC: 31 g/dL (ref 30.0–36.0)
MCV: 86.1 fL (ref 78.0–100.0)
PLATELETS: 164 10*3/uL (ref 150–400)
RBC: 4.38 MIL/uL (ref 3.87–5.11)
RDW: 14 % (ref 11.5–15.5)
WBC: 8 10*3/uL (ref 4.0–10.5)

## 2017-10-23 LAB — COMPREHENSIVE METABOLIC PANEL
ALK PHOS: 67 U/L (ref 38–126)
ALT: 16 U/L (ref 0–44)
AST: 28 U/L (ref 15–41)
Albumin: 4 g/dL (ref 3.5–5.0)
Anion gap: 11 (ref 5–15)
BILIRUBIN TOTAL: 3.6 mg/dL — AB (ref 0.3–1.2)
BUN: 33 mg/dL — AB (ref 8–23)
CALCIUM: 10 mg/dL (ref 8.9–10.3)
CHLORIDE: 110 mmol/L (ref 98–111)
CO2: 24 mmol/L (ref 22–32)
CREATININE: 1.37 mg/dL — AB (ref 0.44–1.00)
GFR, EST AFRICAN AMERICAN: 37 mL/min — AB (ref >60)
GFR, EST NON AFRICAN AMERICAN: 32 mL/min — AB (ref >60)
Glucose, Bld: 108 mg/dL — ABNORMAL HIGH (ref 70–99)
Potassium: 4.5 mmol/L (ref 3.5–5.1)
Sodium: 145 mmol/L (ref 135–145)
TOTAL PROTEIN: 7.3 g/dL (ref 6.5–8.1)

## 2017-10-23 LAB — CREATININE, SERUM
Creatinine, Ser: 1.34 mg/dL — ABNORMAL HIGH (ref 0.44–1.00)
GFR calc Af Amer: 38 mL/min — ABNORMAL LOW (ref >60)
GFR calc non Af Amer: 33 mL/min — ABNORMAL LOW (ref >60)

## 2017-10-23 MED ORDER — ONDANSETRON HCL 4 MG/2ML IJ SOLN
4.0000 mg | Freq: Once | INTRAMUSCULAR | Status: AC
  Administered 2017-10-23: 4 mg via INTRAVENOUS
  Filled 2017-10-23: qty 2

## 2017-10-23 MED ORDER — OXYCODONE HCL 5 MG PO TABS: 2.5000 mg | ORAL_TABLET | ORAL | Status: DC | PRN

## 2017-10-23 MED ORDER — DICLOFENAC SODIUM 1 % TD GEL
2.0000 g | Freq: Four times a day (QID) | TRANSDERMAL | Status: DC
  Administered 2017-10-23 – 2017-10-25 (×6): 2 g via TOPICAL
  Filled 2017-10-23: qty 100

## 2017-10-23 MED ORDER — FENTANYL CITRATE (PF) 100 MCG/2ML IJ SOLN
50.0000 ug | Freq: Once | INTRAMUSCULAR | Status: AC
  Administered 2017-10-23: 50 ug via INTRAVENOUS
  Filled 2017-10-23: qty 2

## 2017-10-23 MED ORDER — SIMVASTATIN 10 MG PO TABS
10.0000 mg | ORAL_TABLET | Freq: Every day | ORAL | Status: DC
  Administered 2017-10-23 – 2017-10-26 (×4): 10 mg via ORAL
  Filled 2017-10-23 (×4): qty 1

## 2017-10-23 MED ORDER — IOPAMIDOL (ISOVUE-300) INJECTION 61%
100.0000 mL | Freq: Once | INTRAVENOUS | Status: AC | PRN
  Administered 2017-10-23: 80 mL via INTRAVENOUS

## 2017-10-23 MED ORDER — CEFAZOLIN SODIUM-DEXTROSE 1-4 GM/50ML-% IV SOLN
1.0000 g | Freq: Two times a day (BID) | INTRAVENOUS | Status: DC
  Administered 2017-10-23 – 2017-10-25 (×5): 1 g via INTRAVENOUS
  Filled 2017-10-23 (×6): qty 50

## 2017-10-23 MED ORDER — HEPARIN SODIUM (PORCINE) 5000 UNIT/ML IJ SOLN
5000.0000 [IU] | Freq: Three times a day (TID) | INTRAMUSCULAR | Status: DC
  Administered 2017-10-23 – 2017-10-26 (×9): 5000 [IU] via SUBCUTANEOUS
  Filled 2017-10-23 (×9): qty 1

## 2017-10-23 MED ORDER — DOCUSATE SODIUM 100 MG PO CAPS
100.0000 mg | ORAL_CAPSULE | Freq: Two times a day (BID) | ORAL | Status: DC
  Administered 2017-10-23 – 2017-10-26 (×6): 100 mg via ORAL
  Filled 2017-10-23 (×6): qty 1

## 2017-10-23 MED ORDER — ONDANSETRON HCL 4 MG/2ML IJ SOLN: 4.0000 mg | Freq: Four times a day (QID) | INTRAMUSCULAR | Status: DC | PRN

## 2017-10-23 MED ORDER — TRAMADOL HCL 50 MG PO TABS
50.0000 mg | ORAL_TABLET | Freq: Four times a day (QID) | ORAL | Status: DC | PRN
  Filled 2017-10-23: qty 1

## 2017-10-23 MED ORDER — MORPHINE SULFATE (PF) 2 MG/ML IV SOLN
0.5000 mg | INTRAVENOUS | Status: DC | PRN
  Administered 2017-10-24: 0.5 mg via INTRAVENOUS
  Filled 2017-10-23: qty 1

## 2017-10-23 MED ORDER — SODIUM CHLORIDE 0.9 % IV BOLUS
500.0000 mL | Freq: Once | INTRAVENOUS | Status: AC
  Administered 2017-10-23: 500 mL via INTRAVENOUS

## 2017-10-23 MED ORDER — ONDANSETRON 4 MG PO TBDP: 4.0000 mg | ORAL_TABLET | Freq: Four times a day (QID) | ORAL | Status: DC | PRN

## 2017-10-23 MED ORDER — IOPAMIDOL (ISOVUE-300) INJECTION 61%
INTRAVENOUS | Status: AC
  Filled 2017-10-23: qty 100

## 2017-10-23 MED ORDER — ALLOPURINOL 100 MG PO TABS
100.0000 mg | ORAL_TABLET | Freq: Every day | ORAL | Status: DC
  Administered 2017-10-23 – 2017-10-26 (×4): 100 mg via ORAL
  Filled 2017-10-23 (×4): qty 1

## 2017-10-23 MED ORDER — ACETAMINOPHEN 325 MG PO TABS
650.0000 mg | ORAL_TABLET | ORAL | Status: DC | PRN
  Administered 2017-10-24 – 2017-10-25 (×2): 650 mg via ORAL
  Filled 2017-10-23 (×2): qty 2

## 2017-10-23 NOTE — ED Notes (Signed)
Bed: MZ58 Expected date:  Expected time:  Means of arrival:  Comments: EMS 84yo fall hip, abd pain

## 2017-10-23 NOTE — ED Provider Notes (Signed)
[facility] DEPT Provider Note   CSN: [id number] Arrival date & time: 10/23/17  1131     History   Chief Complaint Chief Complaint  Patient presents with  . Fall  . Hip Pain    HPI Shannon Berg is a 84 y.o. female.  The history is provided by the patient and a relative. No language interpreter was used.  Fall   Hip Pain    Shannon Berg is a 84 y.o. female who presents to the Emergency Department complaining of fall.  Level V caveat due to confusion. History is provided by the patient's niece. Ms Tineo had an unwitnessed fall yesterday in her apartment. She was getting up when she fell and struck her left side over a chair and end table. Unknown if she hit her head. She took Tylenol twice yesterday for pain. Today when staff came to check on her she was confused and hallucinating complaining of severe in her left side. She reports pain to the chest and abdomen, worse with moving.  No reports of recent illnesses.  Denies fevers, vomiting, diarrhea.   Past Medical History:  Diagnosis Date  . Calculus of kidney   . Diverticulosis of colon (without mention of hemorrhage)   . Dizziness and giddiness   . Edema   . Essential hypertension, benign   . Hyperlipidemia LDL goal < 100   . Other and unspecified hyperlipidemia   . Other B-complex deficiencies   . Other nonspecific abnormal serum enzyme levels   . Other specified disorder of skin   . Pain in joint, lower leg   . Reflux esophagitis   . Senile osteoporosis   . Unspecified disorder of kidney and ureter     Patient Active Problem List   Diagnosis Date Noted  . Vascular dementia 11/16/2016  . Obesity (BMI 30.0-34.9) 11/16/2016  . 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 Epidermoid cyst of skin 02/22/2016  . BPPV (benign paroxysmal positional vertigo) 12/14/2014  . Chronic kidney disease, stage IV (severe) ([place]) 08/11/2013  . Primary  osteoarthritis of knee 08/11/2013  . Allergic rhinitis 05/12/2013  . Sinusitis, acute maxillary 05/12/2013  . B12 deficiency anemia 04/10/2013  . Essential hypertension, benign   . Hyperlipidemia   . OVARIAN CYST 06/29/2007  . NEPHROLITHIASIS, HX OF 06/29/2007    Past Surgical History:  Procedure Laterality Date  . EXCISION OF ADNEXAL MASS  2007  . FRACTURE SURGERY N/A 1984   wrist  . GANGLION CYST EXCISION  1974     OB History   None      Home Medications    Prior to Admission medications   Medication Sig Start Date End Date Taking? Authorizing Provider  allopurinol (ZYLOPRIM) 100 MG tablet Take 1 tablet (100 mg total) by mouth daily. 08/30/17  Yes Reed, Tiffany L, DO  aspirin EC 81 MG tablet Take 81 mg by mouth every other day.    Yes [provider]  Cholecalciferol (VITAMIN D3) 2000 UNITS capsule Take 1 capsule (2,000 Units total) by mouth daily. 12/14/14  Yes Reed, Tiffany L, DO  diclofenac sodium (VOLTAREN) 1 % GEL Apply 2 g topically 4 (four) times daily. To affected hands as needed 02/28/16  Yes Reed, Tiffany L, DO  simvastatin (ZOCOR) 10 MG tablet TAKE 1 TABLET ONCE DAILY. 09/19/17  Yes Reed, Tiffany L, DO  TRAVEL SICKNESS 25 MG CHEW TAKE 1/2 TABLET THREE TIMES DAILY AS NEEDED FOR DIZZINESS. 05/24/17  Yes Gayland Curry, DO    Family History Family History  Problem Relation Age of Onset  . Cancer Brother     Social History Social History   Tobacco Use  . Smoking status: Never Smoker  . Smokeless tobacco: Never Used  Substance Use Topics  . Alcohol use: No    Alcohol/week: 0.0 standard drinks  . Drug use: No     Allergies   Patient has no known allergies.   Review of Systems Review of Systems  Unable to perform ROS: Dementia     Physical Exam Updated Vital Signs BP (!) 151/73   Pulse 76   Temp (!) 97.5 F (36.4 C) (Oral)   Resp 20   SpO2 90%   Physical Exam  Constitutional: She appears well-developed and well-nourished.  HENT:    Head: Normocephalic and atraumatic.  Cardiovascular: Normal rate and regular rhythm.  Faint systolic ejection murmur  Pulmonary/Chest: Effort normal and breath sounds normal. No respiratory distress.  Abdominal: Soft. There is no rebound and no guarding.  Mild generalized abdominal tenderness.    Musculoskeletal: She exhibits no edema.  Ecchymosis and tenderness to the left posterior thorax and flank. Ecchymosis and tenderness to the right thumb. To decrease flexion at the IP joint.  Neurological: She is alert.  Mildly confused. Disoriented to time.  Oriented to place.    Skin: Skin is warm and dry.  Psychiatric: She has a normal mood and affect. Her behavior is normal.  Nursing note and vitals reviewed.    ED Treatments / Results  Labs (all labs ordered are listed, but only abnormal results are displayed) Labs Reviewed  COMPREHENSIVE METABOLIC PANEL - Abnormal; Notable for the following components:      Result Value   Glucose, Bld 108 (*)    BUN 33 (*)    Creatinine, Ser 1.37 (*)    Total Bilirubin 3.6 (*)    GFR calc non Af Amer 32 (*)    GFR calc Af Amer 37 (*)    All other components within normal limits  CBC WITH DIFFERENTIAL/PLATELET - Abnormal; Notable for the following components:   Hemoglobin 11.4 (*)    All other components within normal limits  I-STAT CHEM 8, ED - Abnormal; Notable for the following components:   BUN 32 (*)    Creatinine, Ser 1.50 (*)    Glucose, Bld 102 (*)    Hemoglobin 11.2 (*)    HCT 33.0 (*)    All other components within normal limits  I-STAT TROPONIN, ED    EKG None  Radiology Ct Head Wo Contrast  Result Date: 10/23/2017 CLINICAL DATA:  Patient fell yesterday.  C-spine trauma. EXAM: CT HEAD WITHOUT CONTRAST CT CERVICAL SPINE WITHOUT CONTRAST TECHNIQUE: Multidetector CT imaging of the head and cervical spine was performed following the standard protocol without intravenous contrast. Multiplanar CT image reconstructions of the  cervical spine were also generated. COMPARISON:  None. FINDINGS: CT HEAD FINDINGS Brain: There is no evidence for acute hemorrhage, hydrocephalus, mass lesion, or abnormal extra-axial fluid collection. No definite CT evidence for acute infarction. Diffuse loss of parenchymal volume is consistent with atrophy. Patchy low attenuation in the deep hemispheric and periventricular white matter is nonspecific, but likely reflects chronic microvascular ischemic demyelination. Vascular: No hyperdense vessel or unexpected calcification. Skull: No evidence for fracture. No worrisome lytic or sclerotic lesion. Sinuses/Orbits: The visualized paranasal sinuses and mastoid air cells are clear. Visualized portions of the globes and intraorbital fat are unremarkable. Other: None.  CT CERVICAL SPINE FINDINGS Alignment: Mild accentuation of normal cervical lordosis. Skull base and vertebrae: No evidence for an acute fracture. Small sclerotic focus in the inferior C6 vertebral body is nonspecific. Loss of intervertebral disc height noted at C5-6 and C6-7. Bilateral facet osteoarthritis is worse on the left than the right. Soft tissues and spinal canal: No prevertebral fluid or swelling. No visible canal hematoma. 2.9 cm left thyroid nodule evident. This shows slight interval progression since CT scan of 11/15/2015. Disc levels:  Mild loss of disc height at C5-6 and C6-7. Upper chest: Scarring noted left lung apex. Other: None. IMPRESSION: 1. No acute intracranial abnormality. Atrophy with chronic small vessel white matter ischemic disease. 2. No cervical spine fracture with degenerative changes noted mid levels. 3. 2.9 cm left thyroid nodule, progressive since 11/15/2015. Thyroid ultrasound could be used to further evaluate, as clinically warranted. Electronically Signed   By: Misty Stanley M.D.   On: 10/23/2017 15:07   Ct Chest W Contrast  Result Date: 10/23/2017 CLINICAL DATA:  [place] yesterday. LEFT hip and LEFT lower quadrant  pain. History of hypertension, hyperlipidemia, adnexal mass resection. EXAM: CT CHEST, ABDOMEN, AND PELVIS WITH CONTRAST TECHNIQUE: Multidetector CT imaging of the chest, abdomen and pelvis was performed following the standard protocol during bolus administration of intravenous contrast. CONTRAST:  67mL ISOVUE-300 IOPAMIDOL (ISOVUE-300) INJECTION 61% COMPARISON:  CT chest November 15, 2015 and CT abdomen and pelvis August 03, 2005 FINDINGS: CT CHEST FINDINGS CARDIOVASCULAR: Heart size is mildly enlarged. Mild coronary artery calcifications. No pericardial effusions. Thoracic aorta is normal course and caliber, mild calcific atherosclerosis. Nonspecific enlargement RIGHT main pulmonary artery. MEDIASTINUM/NODES: No mediastinal mass. No lymphadenopathy by CT size criteria. Normal appearance of thoracic esophagus though not tailored for evaluation. LUNGS/PLEURA: Tracheobronchial tree is patent, no pneumothorax. Increasing patchy LEFT lower lobe airspace opacity. Small dense LEFT pleural effusion. Stable LEFT apical scarring. RIGHT lung base atelectasis associated with small fat containing diaphragmatic hernia. MUSCULOSKELETAL: Displaced LEFT ninth through twelfth rib fractures. Enlarged edematous LEFT latissimus dorsi muscle with fat stranding. Osteopenia. RIGHT shoulder bursal collection with loose bodies. Diffusely hypodense 2.6 cm LEFT thyroid nodule. Subcentimeter RIGHT thyroid nodule. CT ABDOMEN AND PELVIS FINDINGS HEPATOBILIARY: Liver and gallbladder are normal. PANCREAS: Normal. Mild focal fatty infiltration about the falciform ligament. SPLEEN: Nonacute.  Borderline splenomegaly. ADRENALS/URINARY TRACT: Kidneys are orthotopic, demonstrating symmetric enhancement. Atrophic LEFT kidney. Bilateral nephrolithiasis measuring to 15 mm on the LEFT. 2.3 cm exophytic mass new from prior CT (64 Hounsfield units on initial phase, 50 Hounsfield units on delayed phase). The unopacified ureters are normal in course and caliber.  Delayed imaging through the kidneys demonstrates symmetric prompt contrast excretion within the proximal urinary collecting system. Urinary bladder is partially distended, mild prolapse. Normal adrenal glands. STOMACH/BOWEL: Small hiatal hernia. Small duodenal diverticulum. The stomach, small and large bowel are normal in course and caliber without inflammatory changes. Severe sigmoid colonic diverticulosis. Normal appendix. VASCULAR/LYMPHATIC: Aortoiliac vessels are normal in course and caliber. Moderate calcific atherosclerosis. No lymphadenopathy by CT size criteria. REPRODUCTIVE: Normal. OTHER: No intraperitoneal free fluid or free air. Small fat containing umbilical hernia. MUSCULOSKELETAL: Non-acute. Osteopenia. Mild lumbar levoscoliosis. Grade 1 L5-S1 anterolisthesis without spondylolysis. IMPRESSION: CT CHEST: 1. Displaced LEFT ninth through twelfth rib fractures. LEFT chest wall and LEFT latissimus dorsi hematoma. 2. Small LEFT hemothorax. LEFT lower lobe atelectasis versus contusion. 3. **An incidental finding of potential clinical significance has been found. 2.6 cm LEFT thyroid nodule. Recommend NONEMERGENT thyroid ultrasound. This follows ACR consensus guidelines: Managing  Incidental Thyroid Nodules Detected on Imaging: White Paper of the ACR Incidental Thyroid Findings Committee. J Am Coll Radiol 2015; 12:143-150.** CT ABDOMEN AND PELVIS: 1. No CT findings of acute trauma. No acute intra-abdominopelvic process. 2. Nonobstructing bilateral nephrolithiasis. 3. **An incidental finding of potential clinical significance has been found. New 2.3 cm LEFT renal indeterminate mass. Recommend non emergent renal protocol CT or MRI with and without contrast.** Aortic Atherosclerosis (ICD10-I70.0). Electronically Signed   By: Elon Alas M.D.   On: 10/23/2017 15:18   Ct Cervical Spine Wo Contrast  Result Date: 10/23/2017 CLINICAL DATA:  Patient fell yesterday.  C-spine trauma. EXAM: CT HEAD WITHOUT  CONTRAST CT CERVICAL SPINE WITHOUT CONTRAST TECHNIQUE: Multidetector CT imaging of the head and cervical spine was performed following the standard protocol without intravenous contrast. Multiplanar CT image reconstructions of the cervical spine were also generated. COMPARISON:  None. FINDINGS: CT HEAD FINDINGS Brain: There is no evidence for acute hemorrhage, hydrocephalus, mass lesion, or abnormal extra-axial fluid collection. No definite CT evidence for acute infarction. Diffuse loss of parenchymal volume is consistent with atrophy. Patchy low attenuation in the deep hemispheric and periventricular white matter is nonspecific, but likely reflects chronic microvascular ischemic demyelination. Vascular: No hyperdense vessel or unexpected calcification. Skull: No evidence for fracture. No worrisome lytic or sclerotic lesion. Sinuses/Orbits: The visualized paranasal sinuses and mastoid air cells are clear. Visualized portions of the globes and intraorbital fat are unremarkable. Other: None. CT CERVICAL SPINE FINDINGS Alignment: Mild accentuation of normal cervical lordosis. Skull base and vertebrae: No evidence for an acute fracture. Small sclerotic focus in the inferior C6 vertebral body is nonspecific. Loss of intervertebral disc height noted at C5-6 and C6-7. Bilateral facet osteoarthritis is worse on the left than the right. Soft tissues and spinal canal: No prevertebral fluid or swelling. No visible canal hematoma. 2.9 cm left thyroid nodule evident. This shows slight interval progression since CT scan of 11/15/2015. Disc levels:  Mild loss of disc height at C5-6 and C6-7. Upper chest: Scarring noted left lung apex. Other: None. IMPRESSION: 1. No acute intracranial abnormality. Atrophy with chronic small vessel white matter ischemic disease. 2. No cervical spine fracture with degenerative changes noted mid levels. 3. 2.9 cm left thyroid nodule, progressive since 11/15/2015. Thyroid ultrasound could be used to  further evaluate, as clinically warranted. Electronically Signed   By: Misty Stanley M.D.   On: 10/23/2017 15:07   Ct Abdomen Pelvis W Contrast  Result Date: 10/23/2017 CLINICAL DATA:  [place] yesterday. LEFT hip and LEFT lower quadrant pain. History of hypertension, hyperlipidemia, adnexal mass resection. EXAM: CT CHEST, ABDOMEN, AND PELVIS WITH CONTRAST TECHNIQUE: Multidetector CT imaging of the chest, abdomen and pelvis was performed following the standard protocol during bolus administration of intravenous contrast. CONTRAST:  74mL ISOVUE-300 IOPAMIDOL (ISOVUE-300) INJECTION 61% COMPARISON:  CT chest November 15, 2015 and CT abdomen and pelvis August 03, 2005 FINDINGS: CT CHEST FINDINGS CARDIOVASCULAR: Heart size is mildly enlarged. Mild coronary artery calcifications. No pericardial effusions. Thoracic aorta is normal course and caliber, mild calcific atherosclerosis. Nonspecific enlargement RIGHT main pulmonary artery. MEDIASTINUM/NODES: No mediastinal mass. No lymphadenopathy by CT size criteria. Normal appearance of thoracic esophagus though not tailored for evaluation. LUNGS/PLEURA: Tracheobronchial tree is patent, no pneumothorax. Increasing patchy LEFT lower lobe airspace opacity. Small dense LEFT pleural effusion. Stable LEFT apical scarring. RIGHT lung base atelectasis associated with small fat containing diaphragmatic hernia. MUSCULOSKELETAL: Displaced LEFT ninth through twelfth rib fractures. Enlarged edematous LEFT latissimus dorsi muscle with fat stranding.  Osteopenia. RIGHT shoulder bursal collection with loose bodies. Diffusely hypodense 2.6 cm LEFT thyroid nodule. Subcentimeter RIGHT thyroid nodule. CT ABDOMEN AND PELVIS FINDINGS HEPATOBILIARY: Liver and gallbladder are normal. PANCREAS: Normal. Mild focal fatty infiltration about the falciform ligament. SPLEEN: Nonacute.  Borderline splenomegaly. ADRENALS/URINARY TRACT: Kidneys are orthotopic, demonstrating symmetric enhancement. Atrophic LEFT  kidney. Bilateral nephrolithiasis measuring to 15 mm on the LEFT. 2.3 cm exophytic mass new from prior CT (64 Hounsfield units on initial phase, 50 Hounsfield units on delayed phase). The unopacified ureters are normal in course and caliber. Delayed imaging through the kidneys demonstrates symmetric prompt contrast excretion within the proximal urinary collecting system. Urinary bladder is partially distended, mild prolapse. Normal adrenal glands. STOMACH/BOWEL: Small hiatal hernia. Small duodenal diverticulum. The stomach, small and large bowel are normal in course and caliber without inflammatory changes. Severe sigmoid colonic diverticulosis. Normal appendix. VASCULAR/LYMPHATIC: Aortoiliac vessels are normal in course and caliber. Moderate calcific atherosclerosis. No lymphadenopathy by CT size criteria. REPRODUCTIVE: Normal. OTHER: No intraperitoneal free fluid or free air. Small fat containing umbilical hernia. MUSCULOSKELETAL: Non-acute. Osteopenia. Mild lumbar levoscoliosis. Grade 1 L5-S1 anterolisthesis without spondylolysis. IMPRESSION: CT CHEST: 1. Displaced LEFT ninth through twelfth rib fractures. LEFT chest wall and LEFT latissimus dorsi hematoma. 2. Small LEFT hemothorax. LEFT lower lobe atelectasis versus contusion. 3. **An incidental finding of potential clinical significance has been found. 2.6 cm LEFT thyroid nodule. Recommend NONEMERGENT thyroid ultrasound. This follows ACR consensus guidelines: Managing Incidental Thyroid Nodules Detected on Imaging: White Paper of the ACR Incidental Thyroid Findings Committee. J Am Coll Radiol 2015; 12:143-150.** CT ABDOMEN AND PELVIS: 1. No CT findings of acute trauma. No acute intra-abdominopelvic process. 2. Nonobstructing bilateral nephrolithiasis. 3. **An incidental finding of potential clinical significance has been found. New 2.3 cm LEFT renal indeterminate mass. Recommend non emergent renal protocol CT or MRI with and without contrast.** Aortic  Atherosclerosis (ICD10-I70.0). Electronically Signed   By: Elon Alas M.D.   On: 10/23/2017 15:18   Dg Chest Port 1 View  Result Date: 10/23/2017 CLINICAL DATA:  Confusion, fell yesterday. EXAM: PORTABLE CHEST 1 VIEW COMPARISON:  CT scan of the chest of November 15, 2015. FINDINGS: The lungs remain hyperinflated. There is a small left pleural effusion. There is no alveolar infiltrate. The heart and pulmonary vascularity are normal. There is tortuosity of the ascending and descending thoracic aorta. There is mural plaque in the thoracic aorta. The bones are subjectively osteopenic. IMPRESSION: COPD.  Small left pleural effusion.  No pneumonia nor CHF. Thoracic aortic atherosclerosis. Electronically Signed   By: David  Martinique M.D.   On: 10/23/2017 13:50    Procedures Procedures (including critical care time)  Medications Ordered in ED Medications  iopamidol (ISOVUE-300) 61 % injection (has no administration in time range)  fentaNYL (SUBLIMAZE) injection 50 mcg (50 mcg Intravenous Given 10/23/17 1354)  ondansetron (ZOFRAN) injection 4 mg (4 mg Intravenous Given 10/23/17 1354)  sodium chloride 0.9 % bolus 500 mL (500 mLs Intravenous New Bag/Given 10/23/17 1353)  iopamidol (ISOVUE-300) 61 % injection 100 mL (80 mLs Intravenous Contrast Given 10/23/17 1433)     Initial Impression / Assessment and Plan / ED Course  I have reviewed the triage vital signs and the nursing notes.  Pertinent labs & imaging results that were available during my care of the patient were reviewed by me and considered in my medical decision making (see chart for details).     Patient here for evaluation of injuries following a fall yesterday. She has significant ecchymosis  to the left flank, mild abdominal tenderness as well as ecchymosis to the right hand. She does have splinting on examination. She is feeling partially improved following pain treatment in the department. She did develop an oxygen requirement of 2 L  nasal cannula. Imaging demonstrates multiple left sided rib fractures, small hemothorax. Surgery service consulted for further management. Patient care transferred pending surgical evaluation.  Patient and family updated of findings of studies and recommendation for admission for further treatment.    Final Clinical Impressions(s) / ED Diagnoses   Final diagnoses:  None    ED Discharge Orders    None       Quintella Reichert, MD 10/23/17 [phone]

## 2017-10-23 NOTE — H&P (Signed)
History   Shannon Berg is an 84 y.o. female.   Chief Complaint:  Chief Complaint  Patient presents with  . Fall  . Hip Pain    Pt is a 84 yo F who sustained a fall yesterday in her apartment in assisted living.  She was found to be confused today when staff checked on her.  She was getting up and fell, striking a table on her left side.  She complained of severe left chest/abdominal pain.  She/family/staff deny recent illnesses or fevers/chills.  She has not fallen like this recently.  She does have a history of vertigo.  She also has a history of chronic kidney dx.  She denies shortness of breath.  She has baseline dementia, but is generally fairly functional.  She also complains of right thumb pain.     Past Medical History:  Diagnosis Date  . Calculus of kidney   . Diverticulosis of colon (without mention of hemorrhage)   . Dizziness and giddiness   . Edema   . Essential hypertension, benign   . Hyperlipidemia LDL goal < 100   . Other and unspecified hyperlipidemia   . Other B-complex deficiencies   . Other nonspecific abnormal serum enzyme levels   . Other specified disorder of skin   . Pain in joint, lower leg   . Reflux esophagitis   . Senile osteoporosis   . Unspecified disorder of kidney and ureter     Past Surgical History:  Procedure Laterality Date  . EXCISION OF ADNEXAL MASS  2007  . FRACTURE SURGERY N/A 1984   wrist  . GANGLION CYST EXCISION  1974    Family History  Problem Relation Age of Onset  . Cancer Brother    Social History:  reports that she has never smoked. She has never used smokeless tobacco. She reports that she does not drink alcohol or use drugs.  Allergies  No Known Allergies  Home Medications   Current Meds  Medication Sig  . allopurinol (ZYLOPRIM) 100 MG tablet Take 1 tablet (100 mg total) by mouth daily.  Marland Kitchen aspirin EC 81 MG tablet Take 81 mg by mouth every other day.   . Cholecalciferol (VITAMIN D3) 2000 UNITS capsule Take 1  capsule (2,000 Units total) by mouth daily.  . diclofenac sodium (VOLTAREN) 1 % GEL Apply 2 g topically 4 (four) times daily. To affected hands as needed  . simvastatin (ZOCOR) 10 MG tablet TAKE 1 TABLET ONCE DAILY.  Marland Kitchen TRAVEL SICKNESS 25 MG CHEW TAKE 1/2 TABLET THREE TIMES DAILY AS NEEDED FOR DIZZINESS.     Trauma Course   Results for orders placed or performed during the hospital encounter of 10/23/17 (from the past 48 hour(s))  Comprehensive metabolic panel     Status: Abnormal   Collection Time: 10/23/17  1:58 PM  Result Value Ref Range   Sodium 145 135 - 145 mmol/L   Potassium 4.5 3.5 - 5.1 mmol/L   Chloride 110 98 - 111 mmol/L   CO2 24 22 - 32 mmol/L   Glucose, Bld 108 (H) 70 - 99 mg/dL   BUN 33 (H) 8 - 23 mg/dL   Creatinine, Ser 1.37 (H) 0.44 - 1.00 mg/dL   Calcium 10.0 8.9 - 10.3 mg/dL   Total Protein 7.3 6.5 - 8.1 g/dL   Albumin 4.0 3.5 - 5.0 g/dL   AST 28 15 - 41 U/L   ALT 16 0 - 44 U/L   Alkaline Phosphatase 67 38 - 126  U/L   Total Bilirubin 3.6 (H) 0.3 - 1.2 mg/dL   GFR calc non Af Amer 32 (L) >60 mL/min   GFR calc Af Amer 37 (L) >60 mL/min    Comment: (NOTE) The eGFR has been calculated using the CKD EPI equation. This calculation has not been validated in all clinical situations. eGFR's persistently <60 mL/min signify possible Chronic Kidney Disease.    Anion gap 11 5 - 15    Comment: Performed at [facility], [place] [address] [place], [place] [address]  CBC with Differential     Status: Abnormal   Collection Time: 10/23/17  1:58 PM  Result Value Ref Range   WBC 8.2 4.0 - 10.5 K/uL   RBC 4.22 3.87 - 5.11 MIL/uL   Hemoglobin 11.4 (L) 12.0 - 15.0 g/dL   HCT 36.0 36.0 - 46.0 %   MCV 85.3 78.0 - 100.0 fL   MCH 27.0 26.0 - 34.0 pg   MCHC 31.7 30.0 - 36.0 g/dL   RDW 14.4 11.5 - 15.5 %   Platelets 174 150 - 400 K/uL   Neutrophils Relative % 83 %   Neutro Abs 6.8 1.7 - 7.7 K/uL   Lymphocytes Relative 9 %   Lymphs Abs 0.8 0.7 - 4.0 K/uL    Monocytes Relative 7 %   Monocytes Absolute 0.6 0.1 - 1.0 K/uL   Eosinophils Relative 1 %   Eosinophils Absolute 0.0 0.0 - 0.7 K/uL   Basophils Relative 0 %   Basophils Absolute 0.0 0.0 - 0.1 K/uL    Comment: Performed at [facility], [place] [address], [place], [place] [address]  I-stat troponin, ED     Status: None   Collection Time: 10/23/17  2:08 PM  Result Value Ref Range   Troponin i, poc 0.00 0.00 - 0.08 ng/mL   Comment 3            Comment: Due to the release kinetics of cTnI, a negative result within the first hours of the onset of symptoms does not rule out myocardial infarction with certainty. If myocardial infarction is still suspected, repeat the test at appropriate intervals.   I-stat Chem 8, ED     Status: Abnormal   Collection Time: 10/23/17  2:09 PM  Result Value Ref Range   Sodium 140 135 - 145 mmol/L   Potassium 4.4 3.5 - 5.1 mmol/L   Chloride 110 98 - 111 mmol/L   BUN 32 (H) 8 - 23 mg/dL   Creatinine, Ser 1.50 (H) 0.44 - 1.00 mg/dL   Glucose, Bld 102 (H) 70 - 99 mg/dL   Calcium, Ion 1.27 1.15 - 1.40 mmol/L   TCO2 24 22 - 32 mmol/L   Hemoglobin 11.2 (L) 12.0 - 15.0 g/dL   HCT 33.0 (L) 36.0 - 46.0 %  Urinalysis, Routine w reflex microscopic     Status: Abnormal   Collection Time: 10/23/17  6:18 PM  Result Value Ref Range   Color, Urine YELLOW YELLOW   APPearance CLEAR CLEAR   Specific Gravity, Urine 1.028 1.005 - 1.030   pH 5.0 5.0 - 8.0   Glucose, UA NEGATIVE NEGATIVE mg/dL   Hgb urine dipstick NEGATIVE NEGATIVE   Bilirubin Urine NEGATIVE NEGATIVE   Ketones, ur NEGATIVE NEGATIVE mg/dL   Protein, ur NEGATIVE NEGATIVE mg/dL   Nitrite NEGATIVE NEGATIVE   Leukocytes, UA MODERATE (A) NEGATIVE   RBC / HPF 0-5 0 - 5 RBC/hpf   WBC, UA 11-20 0 - 5 WBC/hpf  Bacteria, UA NONE SEEN NONE SEEN   Squamous Epithelial / LPF 6-10 0 - 5    Comment: Performed at [facility], [place] [address], [place], [place] [address]   Ct Head  Wo Contrast  Result Date: 10/23/2017 CLINICAL DATA:  Patient fell yesterday.  C-spine trauma. EXAM: CT HEAD WITHOUT CONTRAST CT CERVICAL SPINE WITHOUT CONTRAST TECHNIQUE: Multidetector CT imaging of the head and cervical spine was performed following the standard protocol without intravenous contrast. Multiplanar CT image reconstructions of the cervical spine were also generated. COMPARISON:  None. FINDINGS: CT HEAD FINDINGS Brain: There is no evidence for acute hemorrhage, hydrocephalus, mass lesion, or abnormal extra-axial fluid collection. No definite CT evidence for acute infarction. Diffuse loss of parenchymal volume is consistent with atrophy. Patchy low attenuation in the deep hemispheric and periventricular white matter is nonspecific, but likely reflects chronic microvascular ischemic demyelination. Vascular: No hyperdense vessel or unexpected calcification. Skull: No evidence for fracture. No worrisome lytic or sclerotic lesion. Sinuses/Orbits: The visualized paranasal sinuses and mastoid air cells are clear. Visualized portions of the globes and intraorbital fat are unremarkable. Other: None. CT CERVICAL SPINE FINDINGS Alignment: Mild accentuation of normal cervical lordosis. Skull base and vertebrae: No evidence for an acute fracture. Small sclerotic focus in the inferior C6 vertebral body is nonspecific. Loss of intervertebral disc height noted at C5-6 and C6-7. Bilateral facet osteoarthritis is worse on the left than the right. Soft tissues and spinal canal: No prevertebral fluid or swelling. No visible canal hematoma. 2.9 cm left thyroid nodule evident. This shows slight interval progression since CT scan of 11/15/2015. Disc levels:  Mild loss of disc height at C5-6 and C6-7. Upper chest: Scarring noted left lung apex. Other: None. IMPRESSION: 1. No acute intracranial abnormality. Atrophy with chronic small vessel white matter ischemic disease. 2. No cervical spine fracture with degenerative changes  noted mid levels. 3. 2.9 cm left thyroid nodule, progressive since 11/15/2015. Thyroid ultrasound could be used to further evaluate, as clinically warranted. Electronically Signed   By: Misty Stanley M.D.   On: 10/23/2017 15:07   Ct Chest W Contrast  Result Date: 10/23/2017 CLINICAL DATA:  [place] yesterday. LEFT hip and LEFT lower quadrant pain. History of hypertension, hyperlipidemia, adnexal mass resection. EXAM: CT CHEST, ABDOMEN, AND PELVIS WITH CONTRAST TECHNIQUE: Multidetector CT imaging of the chest, abdomen and pelvis was performed following the standard protocol during bolus administration of intravenous contrast. CONTRAST:  58m ISOVUE-300 IOPAMIDOL (ISOVUE-300) INJECTION 61% COMPARISON:  CT chest November 15, 2015 and CT abdomen and pelvis August 03, 2005 FINDINGS: CT CHEST FINDINGS CARDIOVASCULAR: Heart size is mildly enlarged. Mild coronary artery calcifications. No pericardial effusions. Thoracic aorta is normal course and caliber, mild calcific atherosclerosis. Nonspecific enlargement RIGHT main pulmonary artery. MEDIASTINUM/NODES: No mediastinal mass. No lymphadenopathy by CT size criteria. Normal appearance of thoracic esophagus though not tailored for evaluation. LUNGS/PLEURA: Tracheobronchial tree is patent, no pneumothorax. Increasing patchy LEFT lower lobe airspace opacity. Small dense LEFT pleural effusion. Stable LEFT apical scarring. RIGHT lung base atelectasis associated with small fat containing diaphragmatic hernia. MUSCULOSKELETAL: Displaced LEFT ninth through twelfth rib fractures. Enlarged edematous LEFT latissimus dorsi muscle with fat stranding. Osteopenia. RIGHT shoulder bursal collection with loose bodies. Diffusely hypodense 2.6 cm LEFT thyroid nodule. Subcentimeter RIGHT thyroid nodule. CT ABDOMEN AND PELVIS FINDINGS HEPATOBILIARY: Liver and gallbladder are normal. PANCREAS: Normal. Mild focal fatty infiltration about the falciform ligament. SPLEEN: Nonacute.  Borderline  splenomegaly. ADRENALS/URINARY TRACT: Kidneys are orthotopic, demonstrating symmetric enhancement. Atrophic LEFT  kidney. Bilateral nephrolithiasis measuring to 15 mm on the LEFT. 2.3 cm exophytic mass new from prior CT (64 Hounsfield units on initial phase, 50 Hounsfield units on delayed phase). The unopacified ureters are normal in course and caliber. Delayed imaging through the kidneys demonstrates symmetric prompt contrast excretion within the proximal urinary collecting system. Urinary bladder is partially distended, mild prolapse. Normal adrenal glands. STOMACH/BOWEL: Small hiatal hernia. Small duodenal diverticulum. The stomach, small and large bowel are normal in course and caliber without inflammatory changes. Severe sigmoid colonic diverticulosis. Normal appendix. VASCULAR/LYMPHATIC: Aortoiliac vessels are normal in course and caliber. Moderate calcific atherosclerosis. No lymphadenopathy by CT size criteria. REPRODUCTIVE: Normal. OTHER: No intraperitoneal free fluid or free air. Small fat containing umbilical hernia. MUSCULOSKELETAL: Non-acute. Osteopenia. Mild lumbar levoscoliosis. Grade 1 L5-S1 anterolisthesis without spondylolysis. IMPRESSION: CT CHEST: 1. Displaced LEFT ninth through twelfth rib fractures. LEFT chest wall and LEFT latissimus dorsi hematoma. 2. Small LEFT hemothorax. LEFT lower lobe atelectasis versus contusion. 3. **An incidental finding of potential clinical significance has been found. 2.6 cm LEFT thyroid nodule. Recommend NONEMERGENT thyroid ultrasound. This follows ACR consensus guidelines: Managing Incidental Thyroid Nodules Detected on Imaging: White Paper of the ACR Incidental Thyroid Findings Committee. J Am Coll Radiol 2015; 12:143-150.** CT ABDOMEN AND PELVIS: 1. No CT findings of acute trauma. No acute intra-abdominopelvic process. 2. Nonobstructing bilateral nephrolithiasis. 3. **An incidental finding of potential clinical significance has been found. New 2.3 cm LEFT renal  indeterminate mass. Recommend non emergent renal protocol CT or MRI with and without contrast.** Aortic Atherosclerosis (ICD10-I70.0). Electronically Signed   By: Elon Alas M.D.   On: 10/23/2017 15:18   Ct Cervical Spine Wo Contrast  Result Date: 10/23/2017 CLINICAL DATA:  Patient fell yesterday.  C-spine trauma. EXAM: CT HEAD WITHOUT CONTRAST CT CERVICAL SPINE WITHOUT CONTRAST TECHNIQUE: Multidetector CT imaging of the head and cervical spine was performed following the standard protocol without intravenous contrast. Multiplanar CT image reconstructions of the cervical spine were also generated. COMPARISON:  None. FINDINGS: CT HEAD FINDINGS Brain: There is no evidence for acute hemorrhage, hydrocephalus, mass lesion, or abnormal extra-axial fluid collection. No definite CT evidence for acute infarction. Diffuse loss of parenchymal volume is consistent with atrophy. Patchy low attenuation in the deep hemispheric and periventricular white matter is nonspecific, but likely reflects chronic microvascular ischemic demyelination. Vascular: No hyperdense vessel or unexpected calcification. Skull: No evidence for fracture. No worrisome lytic or sclerotic lesion. Sinuses/Orbits: The visualized paranasal sinuses and mastoid air cells are clear. Visualized portions of the globes and intraorbital fat are unremarkable. Other: None. CT CERVICAL SPINE FINDINGS Alignment: Mild accentuation of normal cervical lordosis. Skull base and vertebrae: No evidence for an acute fracture. Small sclerotic focus in the inferior C6 vertebral body is nonspecific. Loss of intervertebral disc height noted at C5-6 and C6-7. Bilateral facet osteoarthritis is worse on the left than the right. Soft tissues and spinal canal: No prevertebral fluid or swelling. No visible canal hematoma. 2.9 cm left thyroid nodule evident. This shows slight interval progression since CT scan of 11/15/2015. Disc levels:  Mild loss of disc height at C5-6 and  C6-7. Upper chest: Scarring noted left lung apex. Other: None. IMPRESSION: 1. No acute intracranial abnormality. Atrophy with chronic small vessel white matter ischemic disease. 2. No cervical spine fracture with degenerative changes noted mid levels. 3. 2.9 cm left thyroid nodule, progressive since 11/15/2015. Thyroid ultrasound could be used to further evaluate, as clinically warranted. Electronically Signed   By: Misty Stanley  M.D.   On: 10/23/2017 15:07   Ct Abdomen Pelvis W Contrast  Result Date: 10/23/2017 CLINICAL DATA:  [place] yesterday. LEFT hip and LEFT lower quadrant pain. History of hypertension, hyperlipidemia, adnexal mass resection. EXAM: CT CHEST, ABDOMEN, AND PELVIS WITH CONTRAST TECHNIQUE: Multidetector CT imaging of the chest, abdomen and pelvis was performed following the standard protocol during bolus administration of intravenous contrast. CONTRAST:  32m ISOVUE-300 IOPAMIDOL (ISOVUE-300) INJECTION 61% COMPARISON:  CT chest November 15, 2015 and CT abdomen and pelvis August 03, 2005 FINDINGS: CT CHEST FINDINGS CARDIOVASCULAR: Heart size is mildly enlarged. Mild coronary artery calcifications. No pericardial effusions. Thoracic aorta is normal course and caliber, mild calcific atherosclerosis. Nonspecific enlargement RIGHT main pulmonary artery. MEDIASTINUM/NODES: No mediastinal mass. No lymphadenopathy by CT size criteria. Normal appearance of thoracic esophagus though not tailored for evaluation. LUNGS/PLEURA: Tracheobronchial tree is patent, no pneumothorax. Increasing patchy LEFT lower lobe airspace opacity. Small dense LEFT pleural effusion. Stable LEFT apical scarring. RIGHT lung base atelectasis associated with small fat containing diaphragmatic hernia. MUSCULOSKELETAL: Displaced LEFT ninth through twelfth rib fractures. Enlarged edematous LEFT latissimus dorsi muscle with fat stranding. Osteopenia. RIGHT shoulder bursal collection with loose bodies. Diffusely hypodense 2.6 cm LEFT thyroid  nodule. Subcentimeter RIGHT thyroid nodule. CT ABDOMEN AND PELVIS FINDINGS HEPATOBILIARY: Liver and gallbladder are normal. PANCREAS: Normal. Mild focal fatty infiltration about the falciform ligament. SPLEEN: Nonacute.  Borderline splenomegaly. ADRENALS/URINARY TRACT: Kidneys are orthotopic, demonstrating symmetric enhancement. Atrophic LEFT kidney. Bilateral nephrolithiasis measuring to 15 mm on the LEFT. 2.3 cm exophytic mass new from prior CT (64 Hounsfield units on initial phase, 50 Hounsfield units on delayed phase). The unopacified ureters are normal in course and caliber. Delayed imaging through the kidneys demonstrates symmetric prompt contrast excretion within the proximal urinary collecting system. Urinary bladder is partially distended, mild prolapse. Normal adrenal glands. STOMACH/BOWEL: Small hiatal hernia. Small duodenal diverticulum. The stomach, small and large bowel are normal in course and caliber without inflammatory changes. Severe sigmoid colonic diverticulosis. Normal appendix. VASCULAR/LYMPHATIC: Aortoiliac vessels are normal in course and caliber. Moderate calcific atherosclerosis. No lymphadenopathy by CT size criteria. REPRODUCTIVE: Normal. OTHER: No intraperitoneal free fluid or free air. Small fat containing umbilical hernia. MUSCULOSKELETAL: Non-acute. Osteopenia. Mild lumbar levoscoliosis. Grade 1 L5-S1 anterolisthesis without spondylolysis. IMPRESSION: CT CHEST: 1. Displaced LEFT ninth through twelfth rib fractures. LEFT chest wall and LEFT latissimus dorsi hematoma. 2. Small LEFT hemothorax. LEFT lower lobe atelectasis versus contusion. 3. **An incidental finding of potential clinical significance has been found. 2.6 cm LEFT thyroid nodule. Recommend NONEMERGENT thyroid ultrasound. This follows ACR consensus guidelines: Managing Incidental Thyroid Nodules Detected on Imaging: White Paper of the ACR Incidental Thyroid Findings Committee. J Am Coll Radiol 2015; 12:143-150.** CT ABDOMEN  AND PELVIS: 1. No CT findings of acute trauma. No acute intra-abdominopelvic process. 2. Nonobstructing bilateral nephrolithiasis. 3. **An incidental finding of potential clinical significance has been found. New 2.3 cm LEFT renal indeterminate mass. Recommend non emergent renal protocol CT or MRI with and without contrast.** Aortic Atherosclerosis (ICD10-I70.0). Electronically Signed   By: CElon AlasM.D.   On: 10/23/2017 15:18   Dg Chest Port 1 View  Result Date: 10/23/2017 CLINICAL DATA:  Confusion, fell yesterday. EXAM: PORTABLE CHEST 1 VIEW COMPARISON:  CT scan of the chest of November 15, 2015. FINDINGS: The lungs remain hyperinflated. There is a small left pleural effusion. There is no alveolar infiltrate. The heart and pulmonary vascularity are normal. There is tortuosity of the ascending and descending thoracic aorta. There is mural  plaque in the thoracic aorta. The bones are subjectively osteopenic. IMPRESSION: COPD.  Small left pleural effusion.  No pneumonia nor CHF. Thoracic aortic atherosclerosis. Electronically Signed   By: David  Martinique M.D.   On: 10/23/2017 13:50   Dg Hand Complete Right  Result Date: 10/23/2017 CLINICAL DATA:  Unwitnessed fall with bruising to the thumb and index fingers EXAM: RIGHT HAND - COMPLETE 3+ VIEW COMPARISON:  04/08/2015 FINDINGS: Bones appear osteopenic. Possible small avulsion fracture injury at the base of the first proximal phalanx. Irregularity and lucency at sesamoid bone adjacent to head of first metacarpal. No subluxation. Moderate arthritis at the first [facility] joint. Prominent arthritis at the DIP and PIP joints with joint space narrowing, small erosions and osteophytes. IMPRESSION: Suspected fracture through sesamoid bone adjacent to the head of the first metacarpal. Possible small adjacent basilar avulsion involving the first proximal phalanx. Digital and first [facility] joint arthritis. Electronically Signed   By: Donavan Foil M.D.   On: 10/23/2017 17:05     Review of Systems  Constitutional: Negative.   HENT: Positive for hearing loss.   Eyes: Negative.   Respiratory: Negative.   Cardiovascular: Positive for chest pain.  Gastrointestinal: Negative.   Genitourinary: Negative.   Musculoskeletal: Positive for joint pain (right thumb).  Skin: Negative.   Neurological: Negative.   Endo/Heme/Allergies: Negative.   Psychiatric/Behavioral: Positive for memory loss (mild dementia, worse tonight per family).     Blood pressure (!) 169/107, pulse 81, temperature (!) 97.5 F (36.4 C), temperature source Oral, resp. rate (!) 21, SpO2 100 %.   Physical Exam  Constitutional: She is oriented to person, place, and time. She appears well-developed and well-nourished. She appears distressed (l;ooks uncomfortable).  HENT:  Head: Normocephalic and atraumatic.  Right Ear: External ear normal.  Left Ear: External ear normal.  Nose: Nose normal.  Mouth/Throat: Oropharynx is clear and moist.  Eyes: Pupils are equal, round, and reactive to light. Conjunctivae and EOM are normal. Right eye exhibits no discharge. Left eye exhibits no discharge. No scleral icterus.  Neck: Neck supple. No JVD present. No tracheal deviation present. No thyromegaly present.  Sl stiff with sl decreased ROM, but no pain.  Cardiovascular: Normal rate, regular rhythm, normal heart sounds and intact distal pulses. Exam reveals no gallop and no friction rub.  No murmur heard. Trace edema  Respiratory: Effort normal. No respiratory distress. She has no wheezes.     She exhibits tenderness (left lateral tenderness).    Hematoma on left lateral chest/flank Decreased breath sounds left base  GI: Soft. She exhibits no distension and no mass. There is no tenderness. There is no rebound and no guarding.  Musculoskeletal: She exhibits edema, tenderness and deformity (bruising/swelling over right thenar eminence and thum).  Lymphadenopathy:    She has no cervical adenopathy.   Neurological: She is alert and oriented to person, place, and time. No cranial nerve deficit. She exhibits normal muscle tone. Coordination normal.  Skin: Skin is warm and dry. No rash noted. She is not diaphoretic. No erythema. No pallor.  Psychiatric: She has a normal mood and affect. Her behavior is normal. Judgment normal.  Takes a bit to get oriented, but was able to tell me where she lived after a bit of prompting.        Assessment/Plan Fall Left rib fractures Dementia Acute kidney injury on chronic kidney disease stage IV Left chest wall and left back hematoma UTI Anemia of chronic disease Osteoarthritis Left first metacarpal fracture and possible  first proximal phalanx fracture  Left thyroid nodule Left kidney mass   Transfer to trauma service at [facility].   Discussed hand with ortho.  They will see and write for splint. Ok to weight bear with walker. Pulmonary toilet O2 as needed Ambulate Pt/Ot consult SW consult Antibiotics for UTI Discussed risk of pneumonia and blood clot with patient and family.   Discussed delirium that can occur with admission to unfamiliar location in patients with dementia.     Stark Klein 10/23/2017, 6:46 PM   Procedures

## 2017-10-23 NOTE — ED Notes (Signed)
Patient placed on 2LNC d/t oxygen  Saturations 88% on room air.

## 2017-10-23 NOTE — ED Notes (Signed)
Family at bedside. 

## 2017-10-23 NOTE — ED Triage Notes (Signed)
Patient c/o fall yesterday, c/o left hip pain, LLQ abdominal pain, right hand hand. Denies blood thinners, denies hitting, denies LOC, denies neck and back pain.   BP: 146/87 HR: 96 O2: 96%

## 2017-10-24 ENCOUNTER — Inpatient Hospital Stay (HOSPITAL_COMMUNITY): Payer: Medicare Other

## 2017-10-24 LAB — CBC
HCT: 36 % (ref 36.0–46.0)
HEMOGLOBIN: 11.1 g/dL — AB (ref 12.0–15.0)
MCH: 26.7 pg (ref 26.0–34.0)
MCHC: 30.8 g/dL (ref 30.0–36.0)
MCV: 86.7 fL (ref 78.0–100.0)
PLATELETS: 165 10*3/uL (ref 150–400)
RBC: 4.15 MIL/uL (ref 3.87–5.11)
RDW: 13.9 % (ref 11.5–15.5)
WBC: 7.9 10*3/uL (ref 4.0–10.5)

## 2017-10-24 LAB — BASIC METABOLIC PANEL
ANION GAP: 11 (ref 5–15)
BUN: 21 mg/dL (ref 8–23)
CHLORIDE: 107 mmol/L (ref 98–111)
CO2: 22 mmol/L (ref 22–32)
Calcium: 9.5 mg/dL (ref 8.9–10.3)
Creatinine, Ser: 1.32 mg/dL — ABNORMAL HIGH (ref 0.44–1.00)
GFR calc Af Amer: 39 mL/min — ABNORMAL LOW (ref >60)
GFR, EST NON AFRICAN AMERICAN: 34 mL/min — AB (ref >60)
Glucose, Bld: 137 mg/dL — ABNORMAL HIGH (ref 70–99)
Potassium: 3.8 mmol/L (ref 3.5–5.1)
SODIUM: 140 mmol/L (ref 135–145)

## 2017-10-24 MED ORDER — TRAMADOL HCL 50 MG PO TABS
25.0000 mg | ORAL_TABLET | Freq: Four times a day (QID) | ORAL | Status: DC | PRN
  Administered 2017-10-24 – 2017-10-25 (×3): 25 mg via ORAL
  Filled 2017-10-24 (×2): qty 1

## 2017-10-24 MED ORDER — MORPHINE SULFATE (PF) 2 MG/ML IV SOLN
0.5000 mg | INTRAVENOUS | Status: DC | PRN
  Administered 2017-10-25 – 2017-10-26 (×2): 0.5 mg via INTRAVENOUS
  Filled 2017-10-24 (×2): qty 1

## 2017-10-24 MED ORDER — HALOPERIDOL LACTATE 5 MG/ML IJ SOLN
2.0000 mg | Freq: Four times a day (QID) | INTRAMUSCULAR | Status: DC | PRN
  Administered 2017-10-24 – 2017-10-25 (×2): 2 mg via INTRAVENOUS
  Filled 2017-10-24 (×3): qty 1

## 2017-10-24 NOTE — Evaluation (Signed)
Physical Therapy Evaluation Patient Details Name: Shannon Berg MRN: [id number] DOB: 09-05-1922 Today's Date: 10/24/2017   History of Present Illness  Pt is a 84 y/o female admitted secondary to a fall in which she sustained Left 9-12 rib fractures with small hemothorax and Right 1st metacarpal fracture and possible first proximal phalanx fracture. PMH including but not limited to dementia and HTN.    Clinical Impression  Pt presented sitting OOB in recliner chair, awake and willing to participate in therapy session. Prior to admission, pt reported that she ambulated with use of either a rollator or cane and was independent with ADLs. Pt is from an ALF. She currently requires max A for transfers and min A for short distance ambulation with RW. Pt would continue to benefit from skilled physical therapy services at this time while admitted and after d/c to address the below listed limitations in order to improve overall safety and independence with functional mobility.      Follow Up Recommendations SNF    Equipment Recommendations  None recommended by PT    Recommendations for Other Services       Precautions / Restrictions Precautions Precautions: Fall Restrictions Weight Bearing Restrictions: No      Mobility  Bed Mobility               General bed mobility comments: pt OOB in recliner chair upon arrival  Transfers Overall transfer level: Needs assistance Equipment used: Rolling walker (2 wheeled) Transfers: Sit to/from Stand Sit to Stand: Max assist         General transfer comment: increased time and effort, cueing for technique, use of momentum, assist to power into standing from recliner chair  Ambulation/Gait Ambulation/Gait assistance: Min assist Gait Distance (Feet): 50 Feet Assistive device: Rolling walker (2 wheeled) Gait Pattern/deviations: Step-through pattern;Decreased step length - right;Decreased step length - left;Decreased stride  length;Shuffle;Narrow base of support Gait velocity: decreased Gait velocity interpretation: <1.31 ft/sec, indicative of household ambulator General Gait Details: pt with modest instability and shuffling gait pattern with RW, min A for stability; pt very limited secondary to fatigue  Stairs            Wheelchair Mobility    Modified Rankin (Stroke Patients Only)       Balance Overall balance assessment: Needs assistance Sitting-balance support: Feet supported Sitting balance-Leahy Scale: Fair     Standing balance support: Bilateral upper extremity supported Standing balance-Leahy Scale: Poor                               Pertinent Vitals/Pain Pain Assessment: Faces Faces Pain Scale: Hurts even more Pain Location: back Pain Descriptors / Indicators: Sore Pain Intervention(s): Monitored during session;Repositioned    Home Living Family/patient expects to be discharged to:: Assisted living               Home Equipment: Walker - 4 wheels;Cane - single point      Prior Function Level of Independence: Independent with assistive device(s)         Comments: ambulates with either a rollator or cane     Hand Dominance        Extremity/Trunk Assessment   Upper Extremity Assessment Upper Extremity Assessment: Generalized weakness    Lower Extremity Assessment Lower Extremity Assessment: Generalized weakness    Cervical / Trunk Assessment Cervical / Trunk Assessment: Kyphotic  Communication   Communication: No difficulties  Cognition Arousal/Alertness: Awake/alert Behavior During  Therapy: WFL for tasks assessed/performed Overall Cognitive Status: History of cognitive impairments - at baseline Area of Impairment: Following commands;Safety/judgement;Problem solving                       Following Commands: Follows one step commands with increased time Safety/Judgement: Decreased awareness of safety;Decreased awareness of  deficits   Problem Solving: Difficulty sequencing;Requires verbal cues        General Comments      Exercises     Assessment/Plan    PT Assessment Patient needs continued PT services  PT Problem List Decreased strength;Decreased range of motion;Decreased activity tolerance;Decreased balance;Decreased mobility;Decreased coordination;Decreased cognition;Decreased knowledge of use of DME;Decreased safety awareness;Decreased knowledge of precautions;Pain       PT Treatment Interventions DME instruction;Stair training;Gait training;Functional mobility training;Therapeutic activities;Therapeutic exercise;Balance training;Neuromuscular re-education;Cognitive remediation;Patient/family education    PT Goals (Current goals can be found in the Care Plan section)  Acute Rehab PT Goals Patient Stated Goal: decrease pain PT Goal Formulation: With patient Time For Goal Achievement: 11/07/17 Potential to Achieve Goals: Fair    Frequency Min 3X/week   Barriers to discharge        Co-evaluation               AM-PAC PT "6 Clicks" Daily Activity  Outcome Measure Difficulty turning over in bed (including adjusting bedclothes, sheets and blankets)?: Unable Difficulty moving from lying on back to sitting on the side of the bed? : Unable Difficulty sitting down on and standing up from a chair with arms (e.g., wheelchair, bedside commode, etc,.)?: Unable Help needed moving to and from a bed to chair (including a wheelchair)?: A Little Help needed walking in hospital room?: A Little Help needed climbing 3-5 steps with a railing? : A Lot 6 Click Score: 11    End of Session Equipment Utilized During Treatment: Gait belt Activity Tolerance: Patient tolerated treatment well Patient left: in chair;with call bell/phone within reach;with chair alarm set;with family/visitor present Nurse Communication: Mobility status PT Visit Diagnosis: Other abnormalities of gait and mobility (R26.89)     Time: 3646-8032 PT Time Calculation (min) (ACUTE ONLY): 18 min   Charges:   PT Evaluation $PT Eval Moderate Complexity: [place], PT, DPT  Acute Rehabilitation Services Pager [phone] Office [place] 10/24/2017, 4:59 PM

## 2017-10-24 NOTE — Consult Note (Addendum)
Reason for Consult:Right thumb fx Referring Physician: F Byerly  Shannon Berg is an 84 y.o. female.  HPI: Shannon Berg fell in her apartment in her ALF. She c/o thumb and chest pain and was taken to the ED at [facility]. Workup there showed left rib fxs and a right 1st MC/prox phalanx fx. She was transferred to [facility] and hand surgery was consulted. She is RHD. She denies any pain this morning.  Past Medical History:  Diagnosis Date  . Calculus of kidney   . Diverticulosis of colon (without mention of hemorrhage)   . Dizziness and giddiness   . Edema   . Essential hypertension, benign   . Hyperlipidemia LDL goal < 100   . Other and unspecified hyperlipidemia   . Other B-complex deficiencies   . Other nonspecific abnormal serum enzyme levels   . Other specified disorder of skin   . Pain in joint, lower leg   . Reflux esophagitis   . Senile osteoporosis   . Unspecified disorder of kidney and ureter     Past Surgical History:  Procedure Laterality Date  . EXCISION OF ADNEXAL MASS  2007  . FRACTURE SURGERY N/A 1984   wrist  . GANGLION CYST EXCISION  1974    Family History  Problem Relation Age of Onset  . Cancer Brother     Social History:  reports that she has never smoked. She has never used smokeless tobacco. She reports that she does not drink alcohol or use drugs.  Allergies: No Known Allergies  Medications: I have reviewed the patient's current medications.  Results for orders placed or performed during the hospital encounter of 10/23/17 (from the past 48 hour(s))  Comprehensive metabolic panel     Status: Abnormal   Collection Time: 10/23/17  1:58 PM  Result Value Ref Range   Sodium 145 135 - 145 mmol/L   Potassium 4.5 3.5 - 5.1 mmol/L   Chloride 110 98 - 111 mmol/L   CO2 24 22 - 32 mmol/L   Glucose, Bld 108 (H) 70 - 99 mg/dL   BUN 33 (H) 8 - 23 mg/dL   Creatinine, Ser 1.37 (H) 0.44 - 1.00 mg/dL   Calcium 10.0 8.9 - 10.3 mg/dL   Total Protein 7.3 6.5 - 8.1 g/dL   Albumin  4.0 3.5 - 5.0 g/dL   AST 28 15 - 41 U/L   ALT 16 0 - 44 U/L   Alkaline Phosphatase 67 38 - 126 U/L   Total Bilirubin 3.6 (H) 0.3 - 1.2 mg/dL   GFR calc non Af Amer 32 (L) >60 mL/min   GFR calc Af Amer 37 (L) >60 mL/min    Comment: (NOTE) The eGFR has been calculated using the CKD EPI equation. This calculation has not been validated in all clinical situations. eGFR's persistently <60 mL/min signify possible Chronic Kidney Disease.    Anion gap 11 5 - 15    Comment: Performed at [facility], [place] [address], [place], [place] [address]  CBC with Differential     Status: Abnormal   Collection Time: 10/23/17  1:58 PM  Result Value Ref Range   WBC 8.2 4.0 - 10.5 K/uL   RBC 4.22 3.87 - 5.11 MIL/uL   Hemoglobin 11.4 (L) 12.0 - 15.0 g/dL   HCT 36.0 36.0 - 46.0 %   MCV 85.3 78.0 - 100.0 fL   MCH 27.0 26.0 - 34.0 pg   MCHC 31.7 30.0 - 36.0 g/dL   RDW 14.4 11.5 -  15.5 %   Platelets 174 150 - 400 K/uL   Neutrophils Relative % 83 %   Neutro Abs 6.8 1.7 - 7.7 K/uL   Lymphocytes Relative 9 %   Lymphs Abs 0.8 0.7 - 4.0 K/uL   Monocytes Relative 7 %   Monocytes Absolute 0.6 0.1 - 1.0 K/uL   Eosinophils Relative 1 %   Eosinophils Absolute 0.0 0.0 - 0.7 K/uL   Basophils Relative 0 %   Basophils Absolute 0.0 0.0 - 0.1 K/uL    Comment: Performed at [facility], [place] [address] [place], [place] [address]  I-stat troponin, ED     Status: None   Collection Time: 10/23/17  2:08 PM  Result Value Ref Range   Troponin i, poc 0.00 0.00 - 0.08 ng/mL   Comment 3            Comment: Due to the release kinetics of cTnI, a negative result within the first hours of the onset of symptoms does not rule out myocardial infarction with certainty. If myocardial infarction is still suspected, repeat the test at appropriate intervals.   I-stat Chem 8, ED     Status: Abnormal   Collection Time: 10/23/17  2:09 PM  Result Value Ref Range   Sodium 140 135 - 145 mmol/L    Potassium 4.4 3.5 - 5.1 mmol/L   Chloride 110 98 - 111 mmol/L   BUN 32 (H) 8 - 23 mg/dL   Creatinine, Ser 1.50 (H) 0.44 - 1.00 mg/dL   Glucose, Bld 102 (H) 70 - 99 mg/dL   Calcium, Ion 1.27 1.15 - 1.40 mmol/L   TCO2 24 22 - 32 mmol/L   Hemoglobin 11.2 (L) 12.0 - 15.0 g/dL   HCT 33.0 (L) 36.0 - 46.0 %  Urinalysis, Routine w reflex microscopic     Status: Abnormal   Collection Time: 10/23/17  6:18 PM  Result Value Ref Range   Color, Urine YELLOW YELLOW   APPearance CLEAR CLEAR   Specific Gravity, Urine 1.028 1.005 - 1.030   pH 5.0 5.0 - 8.0   Glucose, UA NEGATIVE NEGATIVE mg/dL   Hgb urine dipstick NEGATIVE NEGATIVE   Bilirubin Urine NEGATIVE NEGATIVE   Ketones, ur NEGATIVE NEGATIVE mg/dL   Protein, ur NEGATIVE NEGATIVE mg/dL   Nitrite NEGATIVE NEGATIVE   Leukocytes, UA MODERATE (A) NEGATIVE   RBC / HPF 0-5 0 - 5 RBC/hpf   WBC, UA 11-20 0 - 5 WBC/hpf   Bacteria, UA NONE SEEN NONE SEEN   Squamous Epithelial / LPF 6-10 0 - 5    Comment: Performed at [facility], [place] [address] [place], [place] [address]  CBC     Status: Abnormal   Collection Time: 10/23/17  9:42 PM  Result Value Ref Range   WBC 8.0 4.0 - 10.5 K/uL   RBC 4.38 3.87 - 5.11 MIL/uL   Hemoglobin 11.7 (L) 12.0 - 15.0 g/dL   HCT 37.7 36.0 - 46.0 %   MCV 86.1 78.0 - 100.0 fL   MCH 26.7 26.0 - 34.0 pg   MCHC 31.0 30.0 - 36.0 g/dL   RDW 14.0 11.5 - 15.5 %   Platelets 164 150 - 400 K/uL    Comment: Performed at [facility], [place] [address] [place], [place] [address]  Creatinine, serum     Status: Abnormal   Collection Time: 10/23/17  9:42 PM  Result Value Ref Range   Creatinine, Ser 1.34 (H) 0.44 - 1.00 mg/dL  GFR calc non Af Amer 33 (L) >60 mL/min   GFR calc Af Amer 38 (L) >60 mL/min    Comment: (NOTE) The eGFR has been calculated using the CKD EPI equation. This calculation has not been validated in all clinical situations. eGFR's persistently <60 mL/min signify possible Chronic  Kidney Disease. Performed at [facility], [place] [address], [place], [place] [address]   CBC     Status: Abnormal   Collection Time: 10/24/17  5:50 AM  Result Value Ref Range   WBC 7.9 4.0 - 10.5 K/uL   RBC 4.15 3.87 - 5.11 MIL/uL   Hemoglobin 11.1 (L) 12.0 - 15.0 g/dL   HCT 36.0 36.0 - 46.0 %   MCV 86.7 78.0 - 100.0 fL   MCH 26.7 26.0 - 34.0 pg   MCHC 30.8 30.0 - 36.0 g/dL   RDW 13.9 11.5 - 15.5 %   Platelets 165 150 - 400 K/uL    Comment: Performed at [facility], [place] [address], [place], [place] [address]  Basic metabolic panel     Status: Abnormal   Collection Time: 10/24/17  5:50 AM  Result Value Ref Range   Sodium 140 135 - 145 mmol/L   Potassium 3.8 3.5 - 5.1 mmol/L   Chloride 107 98 - 111 mmol/L   CO2 22 22 - 32 mmol/L   Glucose, Bld 137 (H) 70 - 99 mg/dL   BUN 21 8 - 23 mg/dL   Creatinine, Ser 1.32 (H) 0.44 - 1.00 mg/dL   Calcium 9.5 8.9 - 10.3 mg/dL   GFR calc non Af Amer 34 (L) >60 mL/min   GFR calc Af Amer 39 (L) >60 mL/min    Comment: (NOTE) The eGFR has been calculated using the CKD EPI equation. This calculation has not been validated in all clinical situations. eGFR's persistently <60 mL/min signify possible Chronic Kidney Disease.    Anion gap 11 5 - 15    Comment: Performed at [facility] [address], [place], [place] [address]    Ct Head Wo Contrast  Result Date: 10/23/2017 CLINICAL DATA:  Patient fell yesterday.  C-spine trauma. EXAM: CT HEAD WITHOUT CONTRAST CT CERVICAL SPINE WITHOUT CONTRAST TECHNIQUE: Multidetector CT imaging of the head and cervical spine was performed following the standard protocol without intravenous contrast. Multiplanar CT image reconstructions of the cervical spine were also generated. COMPARISON:  None. FINDINGS: CT HEAD FINDINGS Brain: There is no evidence for acute hemorrhage, hydrocephalus, mass lesion, or abnormal extra-axial fluid collection. No definite CT evidence for acute infarction. Diffuse  loss of parenchymal volume is consistent with atrophy. Patchy low attenuation in the deep hemispheric and periventricular white matter is nonspecific, but likely reflects chronic microvascular ischemic demyelination. Vascular: No hyperdense vessel or unexpected calcification. Skull: No evidence for fracture. No worrisome lytic or sclerotic lesion. Sinuses/Orbits: The visualized paranasal sinuses and mastoid air cells are clear. Visualized portions of the globes and intraorbital fat are unremarkable. Other: None. CT CERVICAL SPINE FINDINGS Alignment: Mild accentuation of normal cervical lordosis. Skull base and vertebrae: No evidence for an acute fracture. Small sclerotic focus in the inferior C6 vertebral body is nonspecific. Loss of intervertebral disc height noted at C5-6 and C6-7. Bilateral facet osteoarthritis is worse on the left than the right. Soft tissues and spinal canal: No prevertebral fluid or swelling. No visible canal hematoma. 2.9 cm left thyroid nodule evident. This shows slight interval progression since CT scan of 11/15/2015. Disc levels:  Mild loss  of disc height at C5-6 and C6-7. Upper chest: Scarring noted left lung apex. Other: None. IMPRESSION: 1. No acute intracranial abnormality. Atrophy with chronic small vessel white matter ischemic disease. 2. No cervical spine fracture with degenerative changes noted mid levels. 3. 2.9 cm left thyroid nodule, progressive since 11/15/2015. Thyroid ultrasound could be used to further evaluate, as clinically warranted. Electronically Signed   By: Misty Stanley M.D.   On: 10/23/2017 15:07   Ct Chest W Contrast  Result Date: 10/23/2017 CLINICAL DATA:  [place] yesterday. LEFT hip and LEFT lower quadrant pain. History of hypertension, hyperlipidemia, adnexal mass resection. EXAM: CT CHEST, ABDOMEN, AND PELVIS WITH CONTRAST TECHNIQUE: Multidetector CT imaging of the chest, abdomen and pelvis was performed following the standard protocol during bolus  administration of intravenous contrast. CONTRAST:  23m ISOVUE-300 IOPAMIDOL (ISOVUE-300) INJECTION 61% COMPARISON:  CT chest November 15, 2015 and CT abdomen and pelvis August 03, 2005 FINDINGS: CT CHEST FINDINGS CARDIOVASCULAR: Heart size is mildly enlarged. Mild coronary artery calcifications. No pericardial effusions. Thoracic aorta is normal course and caliber, mild calcific atherosclerosis. Nonspecific enlargement RIGHT main pulmonary artery. MEDIASTINUM/NODES: No mediastinal mass. No lymphadenopathy by CT size criteria. Normal appearance of thoracic esophagus though not tailored for evaluation. LUNGS/PLEURA: Tracheobronchial tree is patent, no pneumothorax. Increasing patchy LEFT lower lobe airspace opacity. Small dense LEFT pleural effusion. Stable LEFT apical scarring. RIGHT lung base atelectasis associated with small fat containing diaphragmatic hernia. MUSCULOSKELETAL: Displaced LEFT ninth through twelfth rib fractures. Enlarged edematous LEFT latissimus dorsi muscle with fat stranding. Osteopenia. RIGHT shoulder bursal collection with loose bodies. Diffusely hypodense 2.6 cm LEFT thyroid nodule. Subcentimeter RIGHT thyroid nodule. CT ABDOMEN AND PELVIS FINDINGS HEPATOBILIARY: Liver and gallbladder are normal. PANCREAS: Normal. Mild focal fatty infiltration about the falciform ligament. SPLEEN: Nonacute.  Borderline splenomegaly. ADRENALS/URINARY TRACT: Kidneys are orthotopic, demonstrating symmetric enhancement. Atrophic LEFT kidney. Bilateral nephrolithiasis measuring to 15 mm on the LEFT. 2.3 cm exophytic mass new from prior CT (64 Hounsfield units on initial phase, 50 Hounsfield units on delayed phase). The unopacified ureters are normal in course and caliber. Delayed imaging through the kidneys demonstrates symmetric prompt contrast excretion within the proximal urinary collecting system. Urinary bladder is partially distended, mild prolapse. Normal adrenal glands. STOMACH/BOWEL: Small hiatal hernia.  Small duodenal diverticulum. The stomach, small and large bowel are normal in course and caliber without inflammatory changes. Severe sigmoid colonic diverticulosis. Normal appendix. VASCULAR/LYMPHATIC: Aortoiliac vessels are normal in course and caliber. Moderate calcific atherosclerosis. No lymphadenopathy by CT size criteria. REPRODUCTIVE: Normal. OTHER: No intraperitoneal free fluid or free air. Small fat containing umbilical hernia. MUSCULOSKELETAL: Non-acute. Osteopenia. Mild lumbar levoscoliosis. Grade 1 L5-S1 anterolisthesis without spondylolysis. IMPRESSION: CT CHEST: 1. Displaced LEFT ninth through twelfth rib fractures. LEFT chest wall and LEFT latissimus dorsi hematoma. 2. Small LEFT hemothorax. LEFT lower lobe atelectasis versus contusion. 3. **An incidental finding of potential clinical significance has been found. 2.6 cm LEFT thyroid nodule. Recommend NONEMERGENT thyroid ultrasound. This follows ACR consensus guidelines: Managing Incidental Thyroid Nodules Detected on Imaging: White Paper of the ACR Incidental Thyroid Findings Committee. J Am Coll Radiol 2015; 12:143-150.** CT ABDOMEN AND PELVIS: 1. No CT findings of acute trauma. No acute intra-abdominopelvic process. 2. Nonobstructing bilateral nephrolithiasis. 3. **An incidental finding of potential clinical significance has been found. New 2.3 cm LEFT renal indeterminate mass. Recommend non emergent renal protocol CT or MRI with and without contrast.** Aortic Atherosclerosis (ICD10-I70.0). Electronically Signed   By: CElon AlasM.D.   On: 10/23/2017 15:18  Ct Cervical Spine Wo Contrast  Result Date: 10/23/2017 CLINICAL DATA:  Patient fell yesterday.  C-spine trauma. EXAM: CT HEAD WITHOUT CONTRAST CT CERVICAL SPINE WITHOUT CONTRAST TECHNIQUE: Multidetector CT imaging of the head and cervical spine was performed following the standard protocol without intravenous contrast. Multiplanar CT image reconstructions of the cervical spine were  also generated. COMPARISON:  None. FINDINGS: CT HEAD FINDINGS Brain: There is no evidence for acute hemorrhage, hydrocephalus, mass lesion, or abnormal extra-axial fluid collection. No definite CT evidence for acute infarction. Diffuse loss of parenchymal volume is consistent with atrophy. Patchy low attenuation in the deep hemispheric and periventricular white matter is nonspecific, but likely reflects chronic microvascular ischemic demyelination. Vascular: No hyperdense vessel or unexpected calcification. Skull: No evidence for fracture. No worrisome lytic or sclerotic lesion. Sinuses/Orbits: The visualized paranasal sinuses and mastoid air cells are clear. Visualized portions of the globes and intraorbital fat are unremarkable. Other: None. CT CERVICAL SPINE FINDINGS Alignment: Mild accentuation of normal cervical lordosis. Skull base and vertebrae: No evidence for an acute fracture. Small sclerotic focus in the inferior C6 vertebral body is nonspecific. Loss of intervertebral disc height noted at C5-6 and C6-7. Bilateral facet osteoarthritis is worse on the left than the right. Soft tissues and spinal canal: No prevertebral fluid or swelling. No visible canal hematoma. 2.9 cm left thyroid nodule evident. This shows slight interval progression since CT scan of 11/15/2015. Disc levels:  Mild loss of disc height at C5-6 and C6-7. Upper chest: Scarring noted left lung apex. Other: None. IMPRESSION: 1. No acute intracranial abnormality. Atrophy with chronic small vessel white matter ischemic disease. 2. No cervical spine fracture with degenerative changes noted mid levels. 3. 2.9 cm left thyroid nodule, progressive since 11/15/2015. Thyroid ultrasound could be used to further evaluate, as clinically warranted. Electronically Signed   By: Misty Stanley M.D.   On: 10/23/2017 15:07   Ct Abdomen Pelvis W Contrast  Result Date: 10/23/2017 CLINICAL DATA:  [place] yesterday. LEFT hip and LEFT lower quadrant pain. History  of hypertension, hyperlipidemia, adnexal mass resection. EXAM: CT CHEST, ABDOMEN, AND PELVIS WITH CONTRAST TECHNIQUE: Multidetector CT imaging of the chest, abdomen and pelvis was performed following the standard protocol during bolus administration of intravenous contrast. CONTRAST:  41m ISOVUE-300 IOPAMIDOL (ISOVUE-300) INJECTION 61% COMPARISON:  CT chest November 15, 2015 and CT abdomen and pelvis August 03, 2005 FINDINGS: CT CHEST FINDINGS CARDIOVASCULAR: Heart size is mildly enlarged. Mild coronary artery calcifications. No pericardial effusions. Thoracic aorta is normal course and caliber, mild calcific atherosclerosis. Nonspecific enlargement RIGHT main pulmonary artery. MEDIASTINUM/NODES: No mediastinal mass. No lymphadenopathy by CT size criteria. Normal appearance of thoracic esophagus though not tailored for evaluation. LUNGS/PLEURA: Tracheobronchial tree is patent, no pneumothorax. Increasing patchy LEFT lower lobe airspace opacity. Small dense LEFT pleural effusion. Stable LEFT apical scarring. RIGHT lung base atelectasis associated with small fat containing diaphragmatic hernia. MUSCULOSKELETAL: Displaced LEFT ninth through twelfth rib fractures. Enlarged edematous LEFT latissimus dorsi muscle with fat stranding. Osteopenia. RIGHT shoulder bursal collection with loose bodies. Diffusely hypodense 2.6 cm LEFT thyroid nodule. Subcentimeter RIGHT thyroid nodule. CT ABDOMEN AND PELVIS FINDINGS HEPATOBILIARY: Liver and gallbladder are normal. PANCREAS: Normal. Mild focal fatty infiltration about the falciform ligament. SPLEEN: Nonacute.  Borderline splenomegaly. ADRENALS/URINARY TRACT: Kidneys are orthotopic, demonstrating symmetric enhancement. Atrophic LEFT kidney. Bilateral nephrolithiasis measuring to 15 mm on the LEFT. 2.3 cm exophytic mass new from prior CT (64 Hounsfield units on initial phase, 50 Hounsfield units on delayed phase). The unopacified ureters are  normal in course and caliber. Delayed  imaging through the kidneys demonstrates symmetric prompt contrast excretion within the proximal urinary collecting system. Urinary bladder is partially distended, mild prolapse. Normal adrenal glands. STOMACH/BOWEL: Small hiatal hernia. Small duodenal diverticulum. The stomach, small and large bowel are normal in course and caliber without inflammatory changes. Severe sigmoid colonic diverticulosis. Normal appendix. VASCULAR/LYMPHATIC: Aortoiliac vessels are normal in course and caliber. Moderate calcific atherosclerosis. No lymphadenopathy by CT size criteria. REPRODUCTIVE: Normal. OTHER: No intraperitoneal free fluid or free air. Small fat containing umbilical hernia. MUSCULOSKELETAL: Non-acute. Osteopenia. Mild lumbar levoscoliosis. Grade 1 L5-S1 anterolisthesis without spondylolysis. IMPRESSION: CT CHEST: 1. Displaced LEFT ninth through twelfth rib fractures. LEFT chest wall and LEFT latissimus dorsi hematoma. 2. Small LEFT hemothorax. LEFT lower lobe atelectasis versus contusion. 3. **An incidental finding of potential clinical significance has been found. 2.6 cm LEFT thyroid nodule. Recommend NONEMERGENT thyroid ultrasound. This follows ACR consensus guidelines: Managing Incidental Thyroid Nodules Detected on Imaging: White Paper of the ACR Incidental Thyroid Findings Committee. J Am Coll Radiol 2015; 12:143-150.** CT ABDOMEN AND PELVIS: 1. No CT findings of acute trauma. No acute intra-abdominopelvic process. 2. Nonobstructing bilateral nephrolithiasis. 3. **An incidental finding of potential clinical significance has been found. New 2.3 cm LEFT renal indeterminate mass. Recommend non emergent renal protocol CT or MRI with and without contrast.** Aortic Atherosclerosis (ICD10-I70.0). Electronically Signed   By: Elon Alas M.D.   On: 10/23/2017 15:18   Dg Chest Port 1 View  Result Date: 10/23/2017 CLINICAL DATA:  Confusion, fell yesterday. EXAM: PORTABLE CHEST 1 VIEW COMPARISON:  CT scan of the  chest of November 15, 2015. FINDINGS: The lungs remain hyperinflated. There is a small left pleural effusion. There is no alveolar infiltrate. The heart and pulmonary vascularity are normal. There is tortuosity of the ascending and descending thoracic aorta. There is mural plaque in the thoracic aorta. The bones are subjectively osteopenic. IMPRESSION: COPD.  Small left pleural effusion.  No pneumonia nor CHF. Thoracic aortic atherosclerosis. Electronically Signed   By: David  Martinique M.D.   On: 10/23/2017 13:50   Dg Hand Complete Right  Result Date: 10/23/2017 CLINICAL DATA:  Unwitnessed fall with bruising to the thumb and index fingers EXAM: RIGHT HAND - COMPLETE 3+ VIEW COMPARISON:  04/08/2015 FINDINGS: Bones appear osteopenic. Possible small avulsion fracture injury at the base of the first proximal phalanx. Irregularity and lucency at sesamoid bone adjacent to head of first metacarpal. No subluxation. Moderate arthritis at the first [facility] joint. Prominent arthritis at the DIP and PIP joints with joint space narrowing, small erosions and osteophytes. IMPRESSION: Suspected fracture through sesamoid bone adjacent to the head of the first metacarpal. Possible small adjacent basilar avulsion involving the first proximal phalanx. Digital and first [facility] joint arthritis. Electronically Signed   By: Donavan Foil M.D.   On: 10/23/2017 17:05    Review of Systems  Constitutional: Negative for weight loss.  HENT: Negative for ear discharge, ear pain, hearing loss and tinnitus.   Eyes: Negative for blurred vision, double vision, photophobia and pain.  Respiratory: Negative for cough, sputum production and shortness of breath.   Cardiovascular: Negative for chest pain.  Gastrointestinal: Negative for abdominal pain, nausea and vomiting.  Genitourinary: Negative for dysuria, flank pain, frequency and urgency.  Musculoskeletal: Negative for back pain, falls, joint pain, myalgias and neck pain.  Neurological: Negative  for dizziness, tingling, sensory change, focal weakness, loss of consciousness and headaches.  Endo/Heme/Allergies: Does not bruise/bleed easily.  Psychiatric/Behavioral: Negative for  depression, memory loss and substance abuse. The patient is not nervous/anxious.    Blood pressure 137/65, pulse 87, temperature 98.3 F (36.8 C), temperature source Oral, resp. rate 17, height 5' (1.524 m), weight 68 kg, SpO2 95 %. Physical Exam  Constitutional: She appears well-developed and well-nourished. No distress.  HENT:  Head: Normocephalic and atraumatic.  Eyes: Conjunctivae are normal. Right eye exhibits no discharge. Left eye exhibits no discharge. No scleral icterus.  Neck: Normal range of motion.  Cardiovascular: Normal rate and regular rhythm.  Respiratory: Effort normal. No respiratory distress.  Musculoskeletal:  Right shoulder, elbow, wrist, digits- no skin wounds, nontender, no instability, no blocks to motion, ecchymosis along dorsal thenar webspace extending to thumb DIP joint  Sens  Ax/R/M/U intact  Mot   Ax/ R/ PIN/ M/ AIN/ U intact  Rad 2+  Neurological: She is alert.  Skin: Skin is warm and dry. She is not diaphoretic.  Psychiatric: She has a normal mood and affect. Her behavior is normal.    Assessment/Plan: Fall Right hand MC/phalangeal fxs -- No need to splint if she's not having any pain. She can f/u with Dr. Amedeo Plenty prn. I would not limit her activity at this time. Multiple rib fxs Dementia AKI on CKD Chronic anemia OA    Lisette Abu, PA-C Orthopedic Surgery [phone] 10/24/2017, 9:40 AM

## 2017-10-24 NOTE — Progress Notes (Signed)
[facility] Progress Note     Subjective: CC: no complaints Patient denies feeling any pain. Patient's niece at the bedside with her. Patient oriented to self only. Denies feeling SOB, sats dropping to 70s but return to low-mid 90s when reminded to take deep breaths. No IS in room. Per mobility tech, patient mobilized well to chair. No splint on R hand, but patient denies pain in R hand.  Objective: Vital signs in last 24 hours: Temp:  [97.5 F (36.4 C)-98.6 F (37 C)] 98.3 F (36.8 C) (09/18 7829) Pulse Rate:  [76-97] 87 (09/18 0613) Resp:  [15-35] 17 (09/18 0613) BP: (137-169)/(52-107) 137/65 (09/18 0613) SpO2:  [90 %-100 %] 95 % (09/18 5621) Weight:  [68 kg] 68 kg (09/18 0113) Last BM Date: 10/22/17  Intake/Output from previous day: 09/17 0701 - 09/18 0700 In: 530 [P.O.:480; IV Piggyback:50] Out: 100 [Urine:100] Intake/Output this shift: Total I/O In: 50 [IV Piggyback:50] Out: 400 [Urine:400]  PE: Gen:  NAD, pleasant Card:  Regular rate and rhythm, pedal pulses 2+ BL Pulm:  Normal effort, clear to auscultation bilaterally, pulled 750 on IS for me Abd: Soft, non-tender, non-distended, bowel sounds present; L flank/back ecchymosis Ext: ecchymosis of R hand without TTP Skin: warm and dry, no rashes  Psych: pleasantly demented  Lab Results:  Recent Labs    10/23/17 2142 10/24/17 0550  WBC 8.0 7.9  HGB 11.7* 11.1*  HCT 37.7 36.0  PLT 164 165   BMET Recent Labs    10/23/17 1358 10/23/17 1409 10/23/17 2142 10/24/17 0550  NA 145 140  --  140  K 4.5 4.4  --  3.8  CL 110 110  --  107  CO2 24  --   --  22  GLUCOSE 108* 102*  --  137*  BUN 33* 32*  --  21  CREATININE 1.37* 1.50* 1.34* 1.32*  CALCIUM 10.0  --   --  9.5   PT/INR No results for input(s): LABPROT, INR in the last 72 hours. CMP     Component Value Date/Time   NA 140 10/24/2017 0550   NA 143 07/19/2015 1018   K 3.8 10/24/2017 0550   CL 107 10/24/2017 0550   CO2 22 10/24/2017  0550   GLUCOSE 137 (H) 10/24/2017 0550   BUN 21 10/24/2017 0550   BUN 30 07/19/2015 1018   CREATININE 1.32 (H) 10/24/2017 0550   CREATININE 1.52 (H) 06/14/2017 1615   CALCIUM 9.5 10/24/2017 0550   PROT 7.3 10/23/2017 1358   PROT 7.0 07/19/2015 1018   ALBUMIN 4.0 10/23/2017 1358   ALBUMIN 4.5 07/19/2015 1018   AST 28 10/23/2017 1358   ALT 16 10/23/2017 1358   ALKPHOS 67 10/23/2017 1358   BILITOT 3.6 (H) 10/23/2017 1358   BILITOT 1.3 (H) 07/19/2015 1018   GFRNONAA 34 (L) 10/24/2017 0550   GFRNONAA 34 (L) 11/16/2016 1028   GFRAA 39 (L) 10/24/2017 0550   GFRAA 40 (L) 11/16/2016 1028   Lipase  No results found for: LIPASE     Studies/Results: Ct Head Wo Contrast  Result Date: 10/23/2017 CLINICAL DATA:  Patient fell yesterday.  C-spine trauma. EXAM: CT HEAD WITHOUT CONTRAST CT CERVICAL SPINE WITHOUT CONTRAST TECHNIQUE: Multidetector CT imaging of the head and cervical spine was performed following the standard protocol without intravenous contrast. Multiplanar CT image reconstructions of the cervical spine were also generated. COMPARISON:  None. FINDINGS: CT HEAD FINDINGS Brain: There is no evidence for acute hemorrhage, hydrocephalus, mass lesion, or abnormal extra-axial  fluid collection. No definite CT evidence for acute infarction. Diffuse loss of parenchymal volume is consistent with atrophy. Patchy low attenuation in the deep hemispheric and periventricular white matter is nonspecific, but likely reflects chronic microvascular ischemic demyelination. Vascular: No hyperdense vessel or unexpected calcification. Skull: No evidence for fracture. No worrisome lytic or sclerotic lesion. Sinuses/Orbits: The visualized paranasal sinuses and mastoid air cells are clear. Visualized portions of the globes and intraorbital fat are unremarkable. Other: None. CT CERVICAL SPINE FINDINGS Alignment: Mild accentuation of normal cervical lordosis. Skull base and vertebrae: No evidence for an acute  fracture. Small sclerotic focus in the inferior C6 vertebral body is nonspecific. Loss of intervertebral disc height noted at C5-6 and C6-7. Bilateral facet osteoarthritis is worse on the left than the right. Soft tissues and spinal canal: No prevertebral fluid or swelling. No visible canal hematoma. 2.9 cm left thyroid nodule evident. This shows slight interval progression since CT scan of 11/15/2015. Disc levels:  Mild loss of disc height at C5-6 and C6-7. Upper chest: Scarring noted left lung apex. Other: None. IMPRESSION: 1. No acute intracranial abnormality. Atrophy with chronic small vessel white matter ischemic disease. 2. No cervical spine fracture with degenerative changes noted mid levels. 3. 2.9 cm left thyroid nodule, progressive since 11/15/2015. Thyroid ultrasound could be used to further evaluate, as clinically warranted. Electronically Signed   By: Misty Stanley M.D.   On: 10/23/2017 15:07   Ct Chest W Contrast  Result Date: 10/23/2017 CLINICAL DATA:  [place] yesterday. LEFT hip and LEFT lower quadrant pain. History of hypertension, hyperlipidemia, adnexal mass resection. EXAM: CT CHEST, ABDOMEN, AND PELVIS WITH CONTRAST TECHNIQUE: Multidetector CT imaging of the chest, abdomen and pelvis was performed following the standard protocol during bolus administration of intravenous contrast. CONTRAST:  13mL ISOVUE-300 IOPAMIDOL (ISOVUE-300) INJECTION 61% COMPARISON:  CT chest November 15, 2015 and CT abdomen and pelvis August 03, 2005 FINDINGS: CT CHEST FINDINGS CARDIOVASCULAR: Heart size is mildly enlarged. Mild coronary artery calcifications. No pericardial effusions. Thoracic aorta is normal course and caliber, mild calcific atherosclerosis. Nonspecific enlargement RIGHT main pulmonary artery. MEDIASTINUM/NODES: No mediastinal mass. No lymphadenopathy by CT size criteria. Normal appearance of thoracic esophagus though not tailored for evaluation. LUNGS/PLEURA: Tracheobronchial tree is patent, no  pneumothorax. Increasing patchy LEFT lower lobe airspace opacity. Small dense LEFT pleural effusion. Stable LEFT apical scarring. RIGHT lung base atelectasis associated with small fat containing diaphragmatic hernia. MUSCULOSKELETAL: Displaced LEFT ninth through twelfth rib fractures. Enlarged edematous LEFT latissimus dorsi muscle with fat stranding. Osteopenia. RIGHT shoulder bursal collection with loose bodies. Diffusely hypodense 2.6 cm LEFT thyroid nodule. Subcentimeter RIGHT thyroid nodule. CT ABDOMEN AND PELVIS FINDINGS HEPATOBILIARY: Liver and gallbladder are normal. PANCREAS: Normal. Mild focal fatty infiltration about the falciform ligament. SPLEEN: Nonacute.  Borderline splenomegaly. ADRENALS/URINARY TRACT: Kidneys are orthotopic, demonstrating symmetric enhancement. Atrophic LEFT kidney. Bilateral nephrolithiasis measuring to 15 mm on the LEFT. 2.3 cm exophytic mass new from prior CT (64 Hounsfield units on initial phase, 50 Hounsfield units on delayed phase). The unopacified ureters are normal in course and caliber. Delayed imaging through the kidneys demonstrates symmetric prompt contrast excretion within the proximal urinary collecting system. Urinary bladder is partially distended, mild prolapse. Normal adrenal glands. STOMACH/BOWEL: Small hiatal hernia. Small duodenal diverticulum. The stomach, small and large bowel are normal in course and caliber without inflammatory changes. Severe sigmoid colonic diverticulosis. Normal appendix. VASCULAR/LYMPHATIC: Aortoiliac vessels are normal in course and caliber. Moderate calcific atherosclerosis. No lymphadenopathy by CT size criteria. REPRODUCTIVE: Normal. OTHER:  No intraperitoneal free fluid or free air. Small fat containing umbilical hernia. MUSCULOSKELETAL: Non-acute. Osteopenia. Mild lumbar levoscoliosis. Grade 1 L5-S1 anterolisthesis without spondylolysis. IMPRESSION: CT CHEST: 1. Displaced LEFT ninth through twelfth rib fractures. LEFT chest wall and  LEFT latissimus dorsi hematoma. 2. Small LEFT hemothorax. LEFT lower lobe atelectasis versus contusion. 3. **An incidental finding of potential clinical significance has been found. 2.6 cm LEFT thyroid nodule. Recommend NONEMERGENT thyroid ultrasound. This follows ACR consensus guidelines: Managing Incidental Thyroid Nodules Detected on Imaging: White Paper of the ACR Incidental Thyroid Findings Committee. J Am Coll Radiol 2015; 12:143-150.** CT ABDOMEN AND PELVIS: 1. No CT findings of acute trauma. No acute intra-abdominopelvic process. 2. Nonobstructing bilateral nephrolithiasis. 3. **An incidental finding of potential clinical significance has been found. New 2.3 cm LEFT renal indeterminate mass. Recommend non emergent renal protocol CT or MRI with and without contrast.** Aortic Atherosclerosis (ICD10-I70.0). Electronically Signed   By: Elon Alas M.D.   On: 10/23/2017 15:18   Ct Cervical Spine Wo Contrast  Result Date: 10/23/2017 CLINICAL DATA:  Patient fell yesterday.  C-spine trauma. EXAM: CT HEAD WITHOUT CONTRAST CT CERVICAL SPINE WITHOUT CONTRAST TECHNIQUE: Multidetector CT imaging of the head and cervical spine was performed following the standard protocol without intravenous contrast. Multiplanar CT image reconstructions of the cervical spine were also generated. COMPARISON:  None. FINDINGS: CT HEAD FINDINGS Brain: There is no evidence for acute hemorrhage, hydrocephalus, mass lesion, or abnormal extra-axial fluid collection. No definite CT evidence for acute infarction. Diffuse loss of parenchymal volume is consistent with atrophy. Patchy low attenuation in the deep hemispheric and periventricular white matter is nonspecific, but likely reflects chronic microvascular ischemic demyelination. Vascular: No hyperdense vessel or unexpected calcification. Skull: No evidence for fracture. No worrisome lytic or sclerotic lesion. Sinuses/Orbits: The visualized paranasal sinuses and mastoid air cells are  clear. Visualized portions of the globes and intraorbital fat are unremarkable. Other: None. CT CERVICAL SPINE FINDINGS Alignment: Mild accentuation of normal cervical lordosis. Skull base and vertebrae: No evidence for an acute fracture. Small sclerotic focus in the inferior C6 vertebral body is nonspecific. Loss of intervertebral disc height noted at C5-6 and C6-7. Bilateral facet osteoarthritis is worse on the left than the right. Soft tissues and spinal canal: No prevertebral fluid or swelling. No visible canal hematoma. 2.9 cm left thyroid nodule evident. This shows slight interval progression since CT scan of 11/15/2015. Disc levels:  Mild loss of disc height at C5-6 and C6-7. Upper chest: Scarring noted left lung apex. Other: None. IMPRESSION: 1. No acute intracranial abnormality. Atrophy with chronic small vessel white matter ischemic disease. 2. No cervical spine fracture with degenerative changes noted mid levels. 3. 2.9 cm left thyroid nodule, progressive since 11/15/2015. Thyroid ultrasound could be used to further evaluate, as clinically warranted. Electronically Signed   By: Misty Stanley M.D.   On: 10/23/2017 15:07   Ct Abdomen Pelvis W Contrast  Result Date: 10/23/2017 CLINICAL DATA:  [place] yesterday. LEFT hip and LEFT lower quadrant pain. History of hypertension, hyperlipidemia, adnexal mass resection. EXAM: CT CHEST, ABDOMEN, AND PELVIS WITH CONTRAST TECHNIQUE: Multidetector CT imaging of the chest, abdomen and pelvis was performed following the standard protocol during bolus administration of intravenous contrast. CONTRAST:  50mL ISOVUE-300 IOPAMIDOL (ISOVUE-300) INJECTION 61% COMPARISON:  CT chest November 15, 2015 and CT abdomen and pelvis August 03, 2005 FINDINGS: CT CHEST FINDINGS CARDIOVASCULAR: Heart size is mildly enlarged. Mild coronary artery calcifications. No pericardial effusions. Thoracic aorta is normal course and caliber, mild  calcific atherosclerosis. Nonspecific enlargement RIGHT  main pulmonary artery. MEDIASTINUM/NODES: No mediastinal mass. No lymphadenopathy by CT size criteria. Normal appearance of thoracic esophagus though not tailored for evaluation. LUNGS/PLEURA: Tracheobronchial tree is patent, no pneumothorax. Increasing patchy LEFT lower lobe airspace opacity. Small dense LEFT pleural effusion. Stable LEFT apical scarring. RIGHT lung base atelectasis associated with small fat containing diaphragmatic hernia. MUSCULOSKELETAL: Displaced LEFT ninth through twelfth rib fractures. Enlarged edematous LEFT latissimus dorsi muscle with fat stranding. Osteopenia. RIGHT shoulder bursal collection with loose bodies. Diffusely hypodense 2.6 cm LEFT thyroid nodule. Subcentimeter RIGHT thyroid nodule. CT ABDOMEN AND PELVIS FINDINGS HEPATOBILIARY: Liver and gallbladder are normal. PANCREAS: Normal. Mild focal fatty infiltration about the falciform ligament. SPLEEN: Nonacute.  Borderline splenomegaly. ADRENALS/URINARY TRACT: Kidneys are orthotopic, demonstrating symmetric enhancement. Atrophic LEFT kidney. Bilateral nephrolithiasis measuring to 15 mm on the LEFT. 2.3 cm exophytic mass new from prior CT (64 Hounsfield units on initial phase, 50 Hounsfield units on delayed phase). The unopacified ureters are normal in course and caliber. Delayed imaging through the kidneys demonstrates symmetric prompt contrast excretion within the proximal urinary collecting system. Urinary bladder is partially distended, mild prolapse. Normal adrenal glands. STOMACH/BOWEL: Small hiatal hernia. Small duodenal diverticulum. The stomach, small and large bowel are normal in course and caliber without inflammatory changes. Severe sigmoid colonic diverticulosis. Normal appendix. VASCULAR/LYMPHATIC: Aortoiliac vessels are normal in course and caliber. Moderate calcific atherosclerosis. No lymphadenopathy by CT size criteria. REPRODUCTIVE: Normal. OTHER: No intraperitoneal free fluid or free air. Small fat containing  umbilical hernia. MUSCULOSKELETAL: Non-acute. Osteopenia. Mild lumbar levoscoliosis. Grade 1 L5-S1 anterolisthesis without spondylolysis. IMPRESSION: CT CHEST: 1. Displaced LEFT ninth through twelfth rib fractures. LEFT chest wall and LEFT latissimus dorsi hematoma. 2. Small LEFT hemothorax. LEFT lower lobe atelectasis versus contusion. 3. **An incidental finding of potential clinical significance has been found. 2.6 cm LEFT thyroid nodule. Recommend NONEMERGENT thyroid ultrasound. This follows ACR consensus guidelines: Managing Incidental Thyroid Nodules Detected on Imaging: White Paper of the ACR Incidental Thyroid Findings Committee. J Am Coll Radiol 2015; 12:143-150.** CT ABDOMEN AND PELVIS: 1. No CT findings of acute trauma. No acute intra-abdominopelvic process. 2. Nonobstructing bilateral nephrolithiasis. 3. **An incidental finding of potential clinical significance has been found. New 2.3 cm LEFT renal indeterminate mass. Recommend non emergent renal protocol CT or MRI with and without contrast.** Aortic Atherosclerosis (ICD10-I70.0). Electronically Signed   By: Elon Alas M.D.   On: 10/23/2017 15:18   Dg Chest Port 1 View  Result Date: 10/23/2017 CLINICAL DATA:  Confusion, fell yesterday. EXAM: PORTABLE CHEST 1 VIEW COMPARISON:  CT scan of the chest of November 15, 2015. FINDINGS: The lungs remain hyperinflated. There is a small left pleural effusion. There is no alveolar infiltrate. The heart and pulmonary vascularity are normal. There is tortuosity of the ascending and descending thoracic aorta. There is mural plaque in the thoracic aorta. The bones are subjectively osteopenic. IMPRESSION: COPD.  Small left pleural effusion.  No pneumonia nor CHF. Thoracic aortic atherosclerosis. Electronically Signed   By: David  Martinique M.D.   On: 10/23/2017 13:50   Dg Hand Complete Right  Result Date: 10/23/2017 CLINICAL DATA:  Unwitnessed fall with bruising to the thumb and index fingers EXAM: RIGHT HAND -  COMPLETE 3+ VIEW COMPARISON:  04/08/2015 FINDINGS: Bones appear osteopenic. Possible small avulsion fracture injury at the base of the first proximal phalanx. Irregularity and lucency at sesamoid bone adjacent to head of first metacarpal. No subluxation. Moderate arthritis at the first [facility] joint. Prominent arthritis at  the DIP and PIP joints with joint space narrowing, small erosions and osteophytes. IMPRESSION: Suspected fracture through sesamoid bone adjacent to the head of the first metacarpal. Possible small adjacent basilar avulsion involving the first proximal phalanx. Digital and first [facility] joint arthritis. Electronically Signed   By: Donavan Foil M.D.   On: 10/23/2017 17:05    Anti-infectives: Anti-infectives (From admission, onward)   Start     Dose/Rate Route Frequency Ordered Stop   10/23/17 2200  ceFAZolin (ANCEF) IVPB 1 g/50 mL premix     1 g 100 mL/hr over 30 Minutes Intravenous Every 12 hours 10/23/17 2133         Assessment/Plan Fall Left 9-12 rib fractures with small hemothorax - CXR this AM without PTX, final read pending - IS, pulm toiletry - supplemental O2 if needed, continue pulse oximetry Left chest wall and back hematoma - Hgb 11.7, VSS Right 1st metacarpal fracture and possible first proximal phalanx fracture - ortho consulted, patient non-tender AKI on CKD stage IV - Cr 1.32, continue to monitor UTI - Ancef q12h Anemia of chronic disease Osteoarthritis Dementia - monitor for acute delirium, patient oriented to self only currently  L thyroid nodule - f/u with PCP L kidney mass - f/u with PCP  FEN: regular diet VTE: SCDs, SQ heparin  ID: Ancef 9/17>>  Dispo: PT/OT. Splint to R hand. IS and pulmonary toiletry  LOS: 1 day    Brigid Re , [facility] 10/24/2017, 8:58 AM Pager: [phone] Mon-Fri 7:00 am-4:30 pm Sat-Sun 7:00 am-11:30 am

## 2017-10-24 NOTE — Plan of Care (Signed)
  Problem: Elimination: Goal: Will not experience complications related to urinary retention Outcome: Progressing   Problem: Pain Managment: Goal: General experience of comfort will improve Outcome: Progressing   Problem: Safety: Goal: Ability to remain free from injury will improve Outcome: Progressing   

## 2017-10-24 NOTE — Social Work (Signed)
CSW acknowledging consult, pt arrived from [place]. Will confirm with family and facility. Pt will need therapy evaluations prior to discharge to ensure proper disposition.   [place], [place] Work [phone]

## 2017-10-24 NOTE — Progress Notes (Signed)
2130 patient arrived via [place] from [facility] ED.  Alert but confused to place and situation.  Cooperative patient, no complaints of pain, appears comfortable.  Will continue to monitor.

## 2017-10-24 NOTE — Progress Notes (Signed)
Through out the morning and afternoon the Patient tried several times to get out of the bed and was pulling at the equipment - her oxygen and pulse ox.  Around 9 AM Pt placed on BSC and then placed in the chair to eat breakfast.  Pt tried several times after that to get out of the chair to go out for a walk, to go out and visit with her brother in law and sister.  To go back to her apartment and try and fix food.  Pt was re-orientated.  She continued to remove her pulse ox, saying she did not need it on.  I gave her pain medication, to see if this was the root of her delirium, however, she continued with the agitation and anxiety.  I spoke to her daughter, and suggested the she have some one come and sit with her, and daughter did have persons come and sit with the patient.  This afternoon, the Pt anxiety and agitation increased and she continued to want to get out of the chair and became very agitated and wanted to get out of chair to back to her room.  Kelly from [facility] was paged, PRN haldol was ordered and given to Pt.  Pt became a little calmer, but still wanted to leave room, but was not as agitated. Pt still talks about making food for visitors, however, she is easily re-orientated. Family has made arrangement for sitters to stay with patient through the night and into tomorrow morning

## 2017-10-25 ENCOUNTER — Inpatient Hospital Stay (HOSPITAL_COMMUNITY): Payer: Medicare Other

## 2017-10-25 LAB — CBC
HCT: 39.1 % (ref 36.0–46.0)
Hemoglobin: 12 g/dL (ref 12.0–15.0)
MCH: 26.7 pg (ref 26.0–34.0)
MCHC: 30.7 g/dL (ref 30.0–36.0)
MCV: 86.9 fL (ref 78.0–100.0)
PLATELETS: 194 10*3/uL (ref 150–400)
RBC: 4.5 MIL/uL (ref 3.87–5.11)
RDW: 14.2 % (ref 11.5–15.5)
WBC: 8.8 10*3/uL (ref 4.0–10.5)

## 2017-10-25 LAB — BASIC METABOLIC PANEL
ANION GAP: 12 (ref 5–15)
BUN: 21 mg/dL (ref 8–23)
CALCIUM: 9.5 mg/dL (ref 8.9–10.3)
CO2: 22 mmol/L (ref 22–32)
Chloride: 106 mmol/L (ref 98–111)
Creatinine, Ser: 1.43 mg/dL — ABNORMAL HIGH (ref 0.44–1.00)
GFR, EST AFRICAN AMERICAN: 35 mL/min — AB (ref >60)
GFR, EST NON AFRICAN AMERICAN: 31 mL/min — AB (ref >60)
Glucose, Bld: 122 mg/dL — ABNORMAL HIGH (ref 70–99)
Potassium: 3.6 mmol/L (ref 3.5–5.1)
SODIUM: 140 mmol/L (ref 135–145)

## 2017-10-25 NOTE — Consult Note (Signed)
[facility] Primary Care Navigator  10/25/2017  Shannon Berg 1922/05/24 [id number]   Met withpatientand nieces Shannon Berg and Shannon Berg) at the bedsidetoidentify possible discharge needs. Nieces mentioned that patient is a resident at [facility] assisted living facility (ALF). Her nieces report that patient had a fall in her apartment in the assisted living facility thathad led to thisadmission. (Fall, hematoma to left lateral chest/ flank, Left 9-12 rib fractures with small hemothorax)   Patient's niece [place] as the primary care provider.   [place] [facility] to obtain medications without difficulty.  Patient's daughter Shannon Berg) has been managingmedications for patient with use of "pillbox"systemfilledoncea week.  Patient's daughter or nieces have been driving andproviding transportation toherdoctors' appointments.  Patientlives alone at the assisted living facility but her daughter is assisting and providing help with her care needs.  Anticipated plan for discharge isskilled nursing facility (SNF- in process)for rehabilitation, per therapy recommendation prior to going back to [place].  Patientand nieces voiced understandingto callprimarycareprovider'soffice once she returns backhome,for a post discharge follow-upvisitwithin1- 2 weeksor sooner if needs arise.Patient letter (with PCP's contact number) was provided astheir reminder.  Explained topatientand nieces aboutTHN CM services available for health management andresourcesat homeandthey denied any current needs or concerns at this point.   Patient's niecesverbalizedunderstandingto seekreferral from primary care provider to [facility] care management ifdeemed necessary and appropriatefor anyservicesin the nearfuture- oncepatientisdischarge home.   [facility] care management information was  provided for futureneeds thatpatientmay have.  Primary care provider's office is listed as providing transition of care (TOC) follow-up.   For additional questions please contact:  Edwena Felty A. Clarice Zulauf, BSN, RN-BC [facility] PRIMARY CARE Navigator Cell: [phone]

## 2017-10-25 NOTE — Progress Notes (Signed)
Occupational Therapy Evaluation Patient Details Name: Shannon Berg MRN: [id number] DOB: 12-13-1922 Today's Date: 10/25/2017    History of Present Illness Pt is a 84 y/o female admitted secondary to a fall in which she sustained Left 9-12 rib fractures with small hemothorax and Right 1st metacarpal fracture and possible first proximal phalanx fracture. PMH including but not limited to dementia and HTN.   Clinical Impression   This 84 y/o female presents with the above. Pt resides in [facility], at baseline was using rollator and [facility] for functional mobility, was receiving some assist from daughter for bathing/dressing ADLs. Pt with baseline cognitive impairments requiring encouragement, increased time and multimodal cues for following instructions and completing functional tasks this session. Pt currently requires minA for seated UB ADLs, maxA for LB ADLs. Pt completed sit<>stand at [place] with overall modA and able to maintain static standing at RW approx 3-5 min with minA prior to requiring seated rest break. Pt mostly limited due to generalized weakness, pain and fatigue. Pt will benefit from continued acute OT services and recommend follow up therapy services in SNF setting after discharge to maximize her overall safety and independence with ADLs and mobility. Will follow.     Follow Up Recommendations  SNF;Supervision/Assistance - 24 hour    Equipment Recommendations  Other (comment)(TBD in next venue)           Precautions / Restrictions Precautions Precautions: Fall Restrictions Weight Bearing Restrictions: No      Mobility Bed Mobility               General bed mobility comments: pt OOB in recliner chair upon arrival  Transfers Overall transfer level: Needs assistance Equipment used: Rolling walker (2 wheeled) Transfers: Sit to/from Stand Sit to Stand: Mod assist         General transfer comment: increased time and effort, cueing for technique, use of momentum, assist to  power into standing from recliner chair; VCs for hand placement on RW once in standing    Balance Overall balance assessment: Needs assistance Sitting-balance support: Feet supported Sitting balance-Leahy Scale: Fair     Standing balance support: Bilateral upper extremity supported Standing balance-Leahy Scale: Poor                             ADL either performed or assessed with clinical judgement   ADL Overall ADL's : Needs assistance/impaired Eating/Feeding: Supervision/ safety;Sitting;Set up   Grooming: Wash/dry face;Oral care;Sitting;Set up;Min guard Grooming Details (indicate cue type and reason): setup assist and cues to initiate task Upper Body Bathing: Minimal assistance;Sitting   Lower Body Bathing: Moderate assistance;Sit to/from stand   Upper Body Dressing : Minimal assistance;Sitting   Lower Body Dressing: Maximal assistance;Sit to/from stand Lower Body Dressing Details (indicate cue type and reason): minA overall for static standing balance; pt limited with ability to access feet due to pain             Functional mobility during ADLs: Moderate assistance;Rolling walker(for sit<>stand) General ADL Comments: pt with difficulty process instructions to ambulate during session and declining to do so when prompting activity; pt willing to stand from recliner and maintains static standing at RW approx 3-5 min prior to needing seated rest break; pt niece also present during session, reports pt has been restless today, provided pt with lap busy board to provide additional engagement throughout the day  Pertinent Vitals/Pain Pain Assessment: Faces Faces Pain Scale: Hurts little more Pain Location: back Pain Descriptors / Indicators: Sore Pain Intervention(s): Monitored during session     Hand Dominance     Extremity/Trunk Assessment Upper Extremity Assessment Upper Extremity Assessment: Generalized weakness;RUE  deficits/detail RUE Deficits / Details: bruising noted around R thumb, mild edema; pt able to use UE functionally but demonstrates minor limitations with fine motor use RUE Coordination: decreased fine motor   Lower Extremity Assessment Lower Extremity Assessment: Defer to PT evaluation   Cervical / Trunk Assessment Cervical / Trunk Assessment: Kyphotic   Communication Communication Communication: No difficulties   Cognition Arousal/Alertness: Awake/alert Behavior During Therapy: WFL for tasks assessed/performed Overall Cognitive Status: History of cognitive impairments - at baseline Area of Impairment: Following commands;Safety/judgement;Problem solving                       Following Commands: Follows one step commands with increased time;Follows one step commands inconsistently Safety/Judgement: Decreased awareness of safety;Decreased awareness of deficits   Problem Solving: Difficulty sequencing;Requires verbal cues;Requires tactile cues General Comments: pt requires simple commands and multimodal cues for following commands   General Comments       Exercises     Shoulder Instructions      Home Living Family/patient expects to be discharged to:: Assisted living                             Home Equipment: Walker - 4 wheels;Cane - single point          Prior Functioning/Environment Level of Independence: Independent with assistive device(s);Needs assistance  Gait / Transfers Assistance Needed: ambulates with either a rollator or cane ADL's / Homemaking Assistance Needed: niece/pt reports daughter has been providing some assist for bathing/dressing ADLs            OT Problem List: Decreased strength;Decreased activity tolerance;Impaired balance (sitting and/or standing)      OT Treatment/Interventions: Self-care/ADL training;Therapeutic exercise;Therapeutic activities;Patient/family education;Balance training    OT Goals(Current goals can  be found in the care plan section) Acute Rehab OT Goals Patient Stated Goal: none stated this session OT Goal Formulation: With patient Time For Goal Achievement: 11/08/17 Potential to Achieve Goals: Good  OT Frequency: Min 2X/week   Barriers to D/C:            Co-evaluation              AM-PAC PT "6 Clicks" Daily Activity     Outcome Measure Help from another person eating meals?: A Little Help from another person taking care of personal grooming?: A Little Help from another person toileting, which includes using toliet, bedpan, or urinal?: A Lot Help from another person bathing (including washing, rinsing, drying)?: A Lot Help from another person to put on and taking off regular upper body clothing?: A Little Help from another person to put on and taking off regular lower body clothing?: A Lot 6 Click Score: 15   End of Session Equipment Utilized During Treatment: Rolling walker Nurse Communication: Mobility status(providing pt with busy board)  Activity Tolerance: Patient tolerated treatment well Patient left: in chair;with call bell/phone within reach;with chair alarm set;with family/visitor present  OT Visit Diagnosis: Unsteadiness on feet (R26.81);Muscle weakness (generalized) (M62.81)                Time: 3762-8315 OT Time Calculation (min): 30 min Charges:  OT General Charges $OT Visit: 1  Visit OT Evaluation $OT Eval Moderate Complexity: 1 Mod OT Treatments $Self Care/Home Management : 8-22 mins  Lou Cal, OT Supplemental Rehabilitation Services Pager [phone] Office [place] 10/25/2017, 1:55 PM

## 2017-10-25 NOTE — NC FL2 (Signed)
[place] LEVEL OF CARE SCREENING TOOL     IDENTIFICATION  Patient Name: Shannon Berg Birthdate: 1922-08-31 Sex: female Admission Date (Current Location): 10/23/2017  [facility] [place] Number:  Herbalist and Address:  [facility] [place], [place] [address] [place], [place] [address]      Provider Number: [id number]  Attending Physician Name and Address:  Md, Trauma, MD  Relative Name and Phone Number:  Shannon Berg, daughter, [phone]    Current Level of Care: Hospital Recommended Level of Care: [place] Prior Approval Number:    Date Approved/Denied:   PASRR Number: [id number]  Discharge Plan: SNF    Current Diagnoses: Patient Active Problem List   Diagnosis Date Noted  . Multiple fractures of ribs of left side 10/23/2017  . Vascular dementia 11/16/2016  . Obesity (BMI 30.0-34.9) 11/16/2016  . 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 Epidermoid cyst of skin 02/22/2016  . BPPV (benign paroxysmal positional vertigo) 12/14/2014  . Chronic kidney disease, stage IV (severe) ([place]) 08/11/2013  . Primary osteoarthritis of knee 08/11/2013  . Allergic rhinitis 05/12/2013  . Sinusitis, acute maxillary 05/12/2013  . B12 deficiency anemia 04/10/2013  . Essential hypertension, benign   . Hyperlipidemia   . OVARIAN CYST 06/29/2007  . NEPHROLITHIASIS, HX OF 06/29/2007    Orientation RESPIRATION BLADDER Height & Weight     Self, Situation  Normal Incontinent, External catheter Weight: 149 lb 14.6 oz (68 kg) Height:  5' (152.4 cm)  BEHAVIORAL SYMPTOMS/MOOD NEUROLOGICAL BOWEL NUTRITION STATUS      Continent Diet(see discharge summary)  AMBULATORY STATUS COMMUNICATION OF NEEDS Skin   Extensive Assist Verbally Skin abrasions                       Personal Care Assistance Level of Assistance  Bathing, Dressing, Feeding Bathing Assistance: Maximum assistance Feeding  assistance: Independent Dressing Assistance: Maximum assistance     Functional Limitations Info  Sight, Hearing, Speech Sight Info: Adequate Hearing Info: Adequate Speech Info: Adequate    SPECIAL CARE FACTORS FREQUENCY  PT (By licensed PT), OT (By licensed OT)     PT Frequency: 5x week OT Frequency: 5x week            Contractures Contractures Info: Not present    Additional Factors Info  Code Status, Allergies Code Status Info: Full Code Allergies Info: No Known Allergies           Current Medications (10/25/2017):  This is the current hospital active medication list Current Facility-Administered Medications  Medication Dose Route Frequency Provider Last Rate Last Dose  . acetaminophen (TYLENOL) tablet 650 mg  650 mg Oral Q4H PRN Stark Klein, MD   650 mg at 10/24/17 1558  . allopurinol (ZYLOPRIM) tablet 100 mg  100 mg Oral Daily Stark Klein, MD   100 mg at 10/24/17 0945  . ceFAZolin (ANCEF) IVPB 1 g/50 mL premix  1 g Intravenous Q12H Stark Klein, MD 100 mL/hr at 10/24/17 2228 1 g at 10/24/17 2228  . diclofenac sodium (VOLTAREN) 1 % transdermal gel 2 g  2 g Topical QID Stark Klein, MD   2 g at 10/24/17 2230  . docusate sodium (COLACE) capsule 100 mg  100 mg Oral BID Stark Klein, MD   100 mg at 10/24/17 2229  . haloperidol lactate (HALDOL) injection 2 mg  2 mg Intravenous Q6H PRN Rayburn, Floyce Stakes, PA-C  2 mg at 10/24/17 1409  . heparin injection 5,000 Units  5,000 Units Subcutaneous Q8H Stark Klein, MD   5,000 Units at 10/25/17 0553  . morphine 2 MG/ML injection 0.5 mg  0.5 mg Intravenous Q3H PRN Rayburn, Kelly A, PA-C   0.5 mg at 10/25/17 0201  . ondansetron (ZOFRAN-ODT) disintegrating tablet 4 mg  4 mg Oral Q6H PRN Stark Klein, MD       Or  . ondansetron (ZOFRAN) injection 4 mg  4 mg Intravenous Q6H PRN Stark Klein, MD      . simvastatin (ZOCOR) tablet 10 mg  10 mg Oral Daily Stark Klein, MD   10 mg at 10/24/17 0945  . traMADol (ULTRAM) tablet 25 mg   25 mg Oral Q6H PRN Rayburn, Kelly A, PA-C   25 mg at 10/25/17 0601     Discharge Medications: Please see discharge summary for a list of discharge medications.  Relevant Imaging Results:  Relevant Lab Results:   Additional Information [id number] [place], [place]

## 2017-10-25 NOTE — Progress Notes (Signed)
Physical Therapy Treatment Patient Details Name: Shannon Berg MRN: [id number] DOB: 01-21-1923 Today's Date: 10/25/2017    History of Present Illness Pt is a 84 y/o female admitted secondary to a fall in which she sustained Left 9-12 rib fractures with small hemothorax and Right 1st metacarpal fracture and possible first proximal phalanx fracture. PMH including but not limited to dementia and HTN.    PT Comments    Pt making slow progress with functional mobility. Pt would continue to benefit from skilled physical therapy services at this time while admitted and after d/c to address the below listed limitations in order to improve overall safety and independence with functional mobility.    Follow Up Recommendations  SNF     Equipment Recommendations  None recommended by PT    Recommendations for Other Services       Precautions / Restrictions Precautions Precautions: Fall Restrictions Weight Bearing Restrictions: No    Mobility  Bed Mobility               General bed mobility comments: pt OOB in recliner chair upon arrival  Transfers Overall transfer level: Needs assistance Equipment used: Rolling walker (2 wheeled) Transfers: Sit to/from Stand Sit to Stand: Max assist         General transfer comment: increased time and effort, cueing for technique, use of momentum, assist to power into standing from recliner chair; VCs for hand placement on RW once in standing  Ambulation/Gait Ambulation/Gait assistance: Min assist Gait Distance (Feet): 100 Feet Assistive device: Rolling walker (2 wheeled) Gait Pattern/deviations: Step-through pattern;Decreased step length - right;Decreased step length - left;Decreased stride length;Shuffle;Narrow base of support Gait velocity: decreased Gait velocity interpretation: <1.31 ft/sec, indicative of household ambulator General Gait Details: pt with modest instability and shuffling gait pattern with RW, min A for stability;  multimodal cueing throughout for safety   Stairs             Wheelchair Mobility    Modified Rankin (Stroke Patients Only)       Balance Overall balance assessment: Needs assistance Sitting-balance support: Feet supported Sitting balance-Leahy Scale: Fair     Standing balance support: Bilateral upper extremity supported Standing balance-Leahy Scale: Poor                              Cognition Arousal/Alertness: Awake/alert Behavior During Therapy: WFL for tasks assessed/performed Overall Cognitive Status: History of cognitive impairments - at baseline Area of Impairment: Memory;Following commands;Safety/judgement;Problem solving                     Memory: Decreased short-term memory;Decreased recall of precautions Following Commands: Follows one step commands with increased time;Follows one step commands inconsistently Safety/Judgement: Decreased awareness of safety;Decreased awareness of deficits   Problem Solving: Difficulty sequencing;Requires verbal cues;Requires tactile cues General Comments: pt requires simple commands and multimodal cues for following commands      Exercises      General Comments        Pertinent Vitals/Pain Pain Assessment: Faces Faces Pain Scale: Hurts little more Pain Location: back Pain Descriptors / Indicators: Sore Pain Intervention(s): Monitored during session;Repositioned    Home Living Family/patient expects to be discharged to:: Assisted living             Home Equipment: Walker - 4 wheels;Cane - single point      Prior Function Level of Independence: Independent with assistive device(s);Needs assistance  Gait /  Transfers Assistance Needed: ambulates with either a rollator or cane ADL's / Homemaking Assistance Needed: niece/pt reports daughter has been providing some assist for bathing/dressing ADLs     PT Goals (current goals can now be found in the care plan section) Acute Rehab PT  Goals Patient Stated Goal: none stated this session PT Goal Formulation: With patient Time For Goal Achievement: 11/07/17 Potential to Achieve Goals: Fair Progress towards PT goals: Progressing toward goals    Frequency    Min 3X/week      PT Plan Current plan remains appropriate    Co-evaluation              AM-PAC PT "6 Clicks" Daily Activity  Outcome Measure  Difficulty turning over in bed (including adjusting bedclothes, sheets and blankets)?: Unable Difficulty moving from lying on back to sitting on the side of the bed? : Unable Difficulty sitting down on and standing up from a chair with arms (e.g., wheelchair, bedside commode, etc,.)?: Unable Help needed moving to and from a bed to chair (including a wheelchair)?: A Little Help needed walking in hospital room?: A Little Help needed climbing 3-5 steps with a railing? : A Lot 6 Click Score: 11    End of Session Equipment Utilized During Treatment: Gait belt Activity Tolerance: Patient tolerated treatment well Patient left: in chair;with call bell/phone within reach;with chair alarm set;with family/visitor present Nurse Communication: Mobility status PT Visit Diagnosis: Other abnormalities of gait and mobility (R26.89)     Time: 1455-1510 PT Time Calculation (min) (ACUTE ONLY): 15 min  Charges:  $Gait Training: 8-22 mins                     Sherie Don, [place], DPT  Acute Rehabilitation Services Pager [phone] Office [place] 10/25/2017, 3:44 PM

## 2017-10-25 NOTE — Clinical Social Work Note (Signed)
Clinical Social Work Assessment  Patient Details  Name: Shannon Berg MRN: [id number] Date of Birth: 1922-10-26  Date of referral:                  Reason for consult:  Facility Placement, Discharge Planning                Permission sought to share information with:  Family Supports, Facility Sport and exercise psychologist Permission granted to share information::  No(pt disoriented)  Name::      Shannon Berg  Agency::  [facility] and SNFs  Relationship::   daughter  Contact Information:   [phone]  Housing/Transportation Living arrangements for the past 2 months:  [address] [place] of Information:  Patient Patient Interpreter Needed:  None Criminal Activity/Legal Involvement Pertinent to Current Situation/Hospitalization:  No - Comment as needed Significant Relationships:  Adult Children, Other Family Members Lives with:  Facility Resident Do you feel safe going back to the place where you live?  Yes Need for family participation in patient care:  Yes (Comment)  Care giving concerns: Pt only oriented to self, was living at [facility] assisted living. Pt has daughter and nieces in [place] but SNF is recommended at d/c by therapy teams prior to returning to ALF.    Social Worker assessment / plan: CSW met with pt at bedside. Pt accompanied by two nieces at bedside. Pt from ALF at [facility]. Pt daughter lives in [place], will come [place] from the beach tomorrow morning. Pt was prior to fall independent with assistance from staff for bathing and dressing. Pt only oriented to self but pleasant. CSW introduced self and role, as well as SNF referral process. Pt nieces were given SNF packet with offers, they will review and talk with pt daughter about choices. Pt family states understanding of how Medicare coverage works and that there are not sitters present at [facility] Discussed that often times families hire private sitters and or rotate family at times of day where  their family member exhibits confusion (evenings and mornings).  CSW encouraged pt nieces if able to go and visit SNFs. Discussed picking a top two and getting back in touch with CSW when those offers are available so we can start insurance authorization.   Employment status:  Retired Nurse, adult PT Recommendations:  [place], [place] / Referral to community resources:  [place]  Patient/Family's Response to care: Pt nieces state understanding of CSW role, recommendations for SNF and are going to do their research in order to pick an offer.   Patient/Family's Understanding of and Emotional Response to Diagnosis, Current Treatment, and Prognosis:  Pt family demonstrate understanding of diagnosis, current treatment and prognosis. Pt family are attentive and engaging with pt. They are emotionally appropriate as is pt, and have a background that allows them to understand the process of searching for a SNF. Pt nieces are assisting in SNF process until pt daughter gets back from beach. Pt and pt family appear happy with care being received at [facility].   Emotional Assessment Appearance:  Appears stated age Attitude/Demeanor/Rapport:  Unable to Assess Affect (typically observed):  Unable to Assess Orientation:  Oriented to Self, Fluctuating Orientation (Suspected and/or reported Sundowners) Alcohol / Substance use:  Not Applicable Psych involvement (Current and /or in the community):  No (Comment)  Discharge Needs  Concerns to be addressed:  Care Coordination Readmission within the last 30 days:  No Current discharge risk:  Cognitively  Impaired, Dependent with Mobility, Physical Impairment Barriers to Discharge:  Ship broker, Continued Medical Work up   [employer], [place] 10/25/2017, 11:19 AM

## 2017-10-25 NOTE — Social Work (Signed)
CSW spoke with pt daughter Emeline Gins, she has chosen [facility]. [facility] has a private room and will start authorization.   Pt daughter will arrive back in [facility] tomorrow morning.   [place], [place] Work [phone]

## 2017-10-25 NOTE — Progress Notes (Signed)
[facility] Progress Note     Subjective: CC: soreness in L flank/hip Soreness over bruised area, but overall patient states she has minimal pain. Patient's niece present at bedside. Patient sitting up in chair and eating a biscuit this AM. Denies R hand pain, but states it looks awful. Per niece, patient's daughter will be back in town from vacation tomorrow.   Objective: Vital signs in last 24 hours: Temp:  [97.6 F (36.4 C)-98.4 F (36.9 C)] 97.6 F (36.4 C) (09/19 0300) Pulse Rate:  [70-93] 70 (09/19 0300) Resp:  [16-20] 18 (09/19 0300) BP: (127-164)/(62-87) 137/64 (09/19 0300) SpO2:  [94 %-99 %] 95 % (09/19 0300) Last BM Date: 10/22/17  Intake/Output from previous day: 09/18 0701 - 09/19 0700 In: 100 [IV Piggyback:100] Out: 900 [Urine:900] Intake/Output this shift: No intake/output data recorded.  PE: Gen:  NAD, pleasant Card:  Regular rate and rhythm, pedal pulses 2+ BL Pulm:  Normal effort, clear to auscultation bilaterally, O2 sats in the 90s on RA Abd: Soft, non-tender, non-distended, bowel sounds present; L flank/back ecchymosis Ext: ecchymosis of R hand without TTP Skin: warm and dry, no rashes  Psych: pleasantly demented  Lab Results:  Recent Labs    10/24/17 0550 10/25/17 0423  WBC 7.9 8.8  HGB 11.1* 12.0  HCT 36.0 39.1  PLT 165 194   BMET Recent Labs    10/24/17 0550 10/25/17 0423  NA 140 140  K 3.8 3.6  CL 107 106  CO2 22 22  GLUCOSE 137* 122*  BUN 21 21  CREATININE 1.32* 1.43*  CALCIUM 9.5 9.5   PT/INR No results for input(s): LABPROT, INR in the last 72 hours. CMP     Component Value Date/Time   NA 140 10/25/2017 0423   NA 143 07/19/2015 1018   K 3.6 10/25/2017 0423   CL 106 10/25/2017 0423   CO2 22 10/25/2017 0423   GLUCOSE 122 (H) 10/25/2017 0423   BUN 21 10/25/2017 0423   BUN 30 07/19/2015 1018   CREATININE 1.43 (H) 10/25/2017 0423   CREATININE 1.52 (H) 06/14/2017 1615   CALCIUM 9.5 10/25/2017 0423   PROT 7.3  10/23/2017 1358   PROT 7.0 07/19/2015 1018   ALBUMIN 4.0 10/23/2017 1358   ALBUMIN 4.5 07/19/2015 1018   AST 28 10/23/2017 1358   ALT 16 10/23/2017 1358   ALKPHOS 67 10/23/2017 1358   BILITOT 3.6 (H) 10/23/2017 1358   BILITOT 1.3 (H) 07/19/2015 1018   GFRNONAA 31 (L) 10/25/2017 0423   GFRNONAA 34 (L) 11/16/2016 1028   GFRAA 35 (L) 10/25/2017 0423   GFRAA 40 (L) 11/16/2016 1028   Lipase  No results found for: LIPASE     Studies/Results: Ct Head Wo Contrast  Result Date: 10/23/2017 CLINICAL DATA:  Patient fell yesterday.  C-spine trauma. EXAM: CT HEAD WITHOUT CONTRAST CT CERVICAL SPINE WITHOUT CONTRAST TECHNIQUE: Multidetector CT imaging of the head and cervical spine was performed following the standard protocol without intravenous contrast. Multiplanar CT image reconstructions of the cervical spine were also generated. COMPARISON:  None. FINDINGS: CT HEAD FINDINGS Brain: There is no evidence for acute hemorrhage, hydrocephalus, mass lesion, or abnormal extra-axial fluid collection. No definite CT evidence for acute infarction. Diffuse loss of parenchymal volume is consistent with atrophy. Patchy low attenuation in the deep hemispheric and periventricular white matter is nonspecific, but likely reflects chronic microvascular ischemic demyelination. Vascular: No hyperdense vessel or unexpected calcification. Skull: No evidence for fracture. No worrisome lytic or sclerotic lesion. Sinuses/Orbits: The  visualized paranasal sinuses and mastoid air cells are clear. Visualized portions of the globes and intraorbital fat are unremarkable. Other: None. CT CERVICAL SPINE FINDINGS Alignment: Mild accentuation of normal cervical lordosis. Skull base and vertebrae: No evidence for an acute fracture. Small sclerotic focus in the inferior C6 vertebral body is nonspecific. Loss of intervertebral disc height noted at C5-6 and C6-7. Bilateral facet osteoarthritis is worse on the left than the right. Soft tissues  and spinal canal: No prevertebral fluid or swelling. No visible canal hematoma. 2.9 cm left thyroid nodule evident. This shows slight interval progression since CT scan of 11/15/2015. Disc levels:  Mild loss of disc height at C5-6 and C6-7. Upper chest: Scarring noted left lung apex. Other: None. IMPRESSION: 1. No acute intracranial abnormality. Atrophy with chronic small vessel white matter ischemic disease. 2. No cervical spine fracture with degenerative changes noted mid levels. 3. 2.9 cm left thyroid nodule, progressive since 11/15/2015. Thyroid ultrasound could be used to further evaluate, as clinically warranted. Electronically Signed   By: Misty Stanley M.D.   On: 10/23/2017 15:07   Ct Chest W Contrast  Result Date: 10/23/2017 CLINICAL DATA:  [place] yesterday. LEFT hip and LEFT lower quadrant pain. History of hypertension, hyperlipidemia, adnexal mass resection. EXAM: CT CHEST, ABDOMEN, AND PELVIS WITH CONTRAST TECHNIQUE: Multidetector CT imaging of the chest, abdomen and pelvis was performed following the standard protocol during bolus administration of intravenous contrast. CONTRAST:  29mL ISOVUE-300 IOPAMIDOL (ISOVUE-300) INJECTION 61% COMPARISON:  CT chest November 15, 2015 and CT abdomen and pelvis August 03, 2005 FINDINGS: CT CHEST FINDINGS CARDIOVASCULAR: Heart size is mildly enlarged. Mild coronary artery calcifications. No pericardial effusions. Thoracic aorta is normal course and caliber, mild calcific atherosclerosis. Nonspecific enlargement RIGHT main pulmonary artery. MEDIASTINUM/NODES: No mediastinal mass. No lymphadenopathy by CT size criteria. Normal appearance of thoracic esophagus though not tailored for evaluation. LUNGS/PLEURA: Tracheobronchial tree is patent, no pneumothorax. Increasing patchy LEFT lower lobe airspace opacity. Small dense LEFT pleural effusion. Stable LEFT apical scarring. RIGHT lung base atelectasis associated with small fat containing diaphragmatic hernia.  MUSCULOSKELETAL: Displaced LEFT ninth through twelfth rib fractures. Enlarged edematous LEFT latissimus dorsi muscle with fat stranding. Osteopenia. RIGHT shoulder bursal collection with loose bodies. Diffusely hypodense 2.6 cm LEFT thyroid nodule. Subcentimeter RIGHT thyroid nodule. CT ABDOMEN AND PELVIS FINDINGS HEPATOBILIARY: Liver and gallbladder are normal. PANCREAS: Normal. Mild focal fatty infiltration about the falciform ligament. SPLEEN: Nonacute.  Borderline splenomegaly. ADRENALS/URINARY TRACT: Kidneys are orthotopic, demonstrating symmetric enhancement. Atrophic LEFT kidney. Bilateral nephrolithiasis measuring to 15 mm on the LEFT. 2.3 cm exophytic mass new from prior CT (64 Hounsfield units on initial phase, 50 Hounsfield units on delayed phase). The unopacified ureters are normal in course and caliber. Delayed imaging through the kidneys demonstrates symmetric prompt contrast excretion within the proximal urinary collecting system. Urinary bladder is partially distended, mild prolapse. Normal adrenal glands. STOMACH/BOWEL: Small hiatal hernia. Small duodenal diverticulum. The stomach, small and large bowel are normal in course and caliber without inflammatory changes. Severe sigmoid colonic diverticulosis. Normal appendix. VASCULAR/LYMPHATIC: Aortoiliac vessels are normal in course and caliber. Moderate calcific atherosclerosis. No lymphadenopathy by CT size criteria. REPRODUCTIVE: Normal. OTHER: No intraperitoneal free fluid or free air. Small fat containing umbilical hernia. MUSCULOSKELETAL: Non-acute. Osteopenia. Mild lumbar levoscoliosis. Grade 1 L5-S1 anterolisthesis without spondylolysis. IMPRESSION: CT CHEST: 1. Displaced LEFT ninth through twelfth rib fractures. LEFT chest wall and LEFT latissimus dorsi hematoma. 2. Small LEFT hemothorax. LEFT lower lobe atelectasis versus contusion. 3. **An incidental finding of  potential clinical significance has been found. 2.6 cm LEFT thyroid nodule.  Recommend NONEMERGENT thyroid ultrasound. This follows ACR consensus guidelines: Managing Incidental Thyroid Nodules Detected on Imaging: White Paper of the ACR Incidental Thyroid Findings Committee. J Am Coll Radiol 2015; 12:143-150.** CT ABDOMEN AND PELVIS: 1. No CT findings of acute trauma. No acute intra-abdominopelvic process. 2. Nonobstructing bilateral nephrolithiasis. 3. **An incidental finding of potential clinical significance has been found. New 2.3 cm LEFT renal indeterminate mass. Recommend non emergent renal protocol CT or MRI with and without contrast.** Aortic Atherosclerosis (ICD10-I70.0). Electronically Signed   By: Elon Alas M.D.   On: 10/23/2017 15:18   Ct Cervical Spine Wo Contrast  Result Date: 10/23/2017 CLINICAL DATA:  Patient fell yesterday.  C-spine trauma. EXAM: CT HEAD WITHOUT CONTRAST CT CERVICAL SPINE WITHOUT CONTRAST TECHNIQUE: Multidetector CT imaging of the head and cervical spine was performed following the standard protocol without intravenous contrast. Multiplanar CT image reconstructions of the cervical spine were also generated. COMPARISON:  None. FINDINGS: CT HEAD FINDINGS Brain: There is no evidence for acute hemorrhage, hydrocephalus, mass lesion, or abnormal extra-axial fluid collection. No definite CT evidence for acute infarction. Diffuse loss of parenchymal volume is consistent with atrophy. Patchy low attenuation in the deep hemispheric and periventricular white matter is nonspecific, but likely reflects chronic microvascular ischemic demyelination. Vascular: No hyperdense vessel or unexpected calcification. Skull: No evidence for fracture. No worrisome lytic or sclerotic lesion. Sinuses/Orbits: The visualized paranasal sinuses and mastoid air cells are clear. Visualized portions of the globes and intraorbital fat are unremarkable. Other: None. CT CERVICAL SPINE FINDINGS Alignment: Mild accentuation of normal cervical lordosis. Skull base and vertebrae: No  evidence for an acute fracture. Small sclerotic focus in the inferior C6 vertebral body is nonspecific. Loss of intervertebral disc height noted at C5-6 and C6-7. Bilateral facet osteoarthritis is worse on the left than the right. Soft tissues and spinal canal: No prevertebral fluid or swelling. No visible canal hematoma. 2.9 cm left thyroid nodule evident. This shows slight interval progression since CT scan of 11/15/2015. Disc levels:  Mild loss of disc height at C5-6 and C6-7. Upper chest: Scarring noted left lung apex. Other: None. IMPRESSION: 1. No acute intracranial abnormality. Atrophy with chronic small vessel white matter ischemic disease. 2. No cervical spine fracture with degenerative changes noted mid levels. 3. 2.9 cm left thyroid nodule, progressive since 11/15/2015. Thyroid ultrasound could be used to further evaluate, as clinically warranted. Electronically Signed   By: Misty Stanley M.D.   On: 10/23/2017 15:07   Ct Abdomen Pelvis W Contrast  Result Date: 10/23/2017 CLINICAL DATA:  [place] yesterday. LEFT hip and LEFT lower quadrant pain. History of hypertension, hyperlipidemia, adnexal mass resection. EXAM: CT CHEST, ABDOMEN, AND PELVIS WITH CONTRAST TECHNIQUE: Multidetector CT imaging of the chest, abdomen and pelvis was performed following the standard protocol during bolus administration of intravenous contrast. CONTRAST:  35mL ISOVUE-300 IOPAMIDOL (ISOVUE-300) INJECTION 61% COMPARISON:  CT chest November 15, 2015 and CT abdomen and pelvis August 03, 2005 FINDINGS: CT CHEST FINDINGS CARDIOVASCULAR: Heart size is mildly enlarged. Mild coronary artery calcifications. No pericardial effusions. Thoracic aorta is normal course and caliber, mild calcific atherosclerosis. Nonspecific enlargement RIGHT main pulmonary artery. MEDIASTINUM/NODES: No mediastinal mass. No lymphadenopathy by CT size criteria. Normal appearance of thoracic esophagus though not tailored for evaluation. LUNGS/PLEURA:  Tracheobronchial tree is patent, no pneumothorax. Increasing patchy LEFT lower lobe airspace opacity. Small dense LEFT pleural effusion. Stable LEFT apical scarring. RIGHT lung base atelectasis associated with  small fat containing diaphragmatic hernia. MUSCULOSKELETAL: Displaced LEFT ninth through twelfth rib fractures. Enlarged edematous LEFT latissimus dorsi muscle with fat stranding. Osteopenia. RIGHT shoulder bursal collection with loose bodies. Diffusely hypodense 2.6 cm LEFT thyroid nodule. Subcentimeter RIGHT thyroid nodule. CT ABDOMEN AND PELVIS FINDINGS HEPATOBILIARY: Liver and gallbladder are normal. PANCREAS: Normal. Mild focal fatty infiltration about the falciform ligament. SPLEEN: Nonacute.  Borderline splenomegaly. ADRENALS/URINARY TRACT: Kidneys are orthotopic, demonstrating symmetric enhancement. Atrophic LEFT kidney. Bilateral nephrolithiasis measuring to 15 mm on the LEFT. 2.3 cm exophytic mass new from prior CT (64 Hounsfield units on initial phase, 50 Hounsfield units on delayed phase). The unopacified ureters are normal in course and caliber. Delayed imaging through the kidneys demonstrates symmetric prompt contrast excretion within the proximal urinary collecting system. Urinary bladder is partially distended, mild prolapse. Normal adrenal glands. STOMACH/BOWEL: Small hiatal hernia. Small duodenal diverticulum. The stomach, small and large bowel are normal in course and caliber without inflammatory changes. Severe sigmoid colonic diverticulosis. Normal appendix. VASCULAR/LYMPHATIC: Aortoiliac vessels are normal in course and caliber. Moderate calcific atherosclerosis. No lymphadenopathy by CT size criteria. REPRODUCTIVE: Normal. OTHER: No intraperitoneal free fluid or free air. Small fat containing umbilical hernia. MUSCULOSKELETAL: Non-acute. Osteopenia. Mild lumbar levoscoliosis. Grade 1 L5-S1 anterolisthesis without spondylolysis. IMPRESSION: CT CHEST: 1. Displaced LEFT ninth through  twelfth rib fractures. LEFT chest wall and LEFT latissimus dorsi hematoma. 2. Small LEFT hemothorax. LEFT lower lobe atelectasis versus contusion. 3. **An incidental finding of potential clinical significance has been found. 2.6 cm LEFT thyroid nodule. Recommend NONEMERGENT thyroid ultrasound. This follows ACR consensus guidelines: Managing Incidental Thyroid Nodules Detected on Imaging: White Paper of the ACR Incidental Thyroid Findings Committee. J Am Coll Radiol 2015; 12:143-150.** CT ABDOMEN AND PELVIS: 1. No CT findings of acute trauma. No acute intra-abdominopelvic process. 2. Nonobstructing bilateral nephrolithiasis. 3. **An incidental finding of potential clinical significance has been found. New 2.3 cm LEFT renal indeterminate mass. Recommend non emergent renal protocol CT or MRI with and without contrast.** Aortic Atherosclerosis (ICD10-I70.0). Electronically Signed   By: Elon Alas M.D.   On: 10/23/2017 15:18   Dg Chest Port 1 View  Result Date: 10/24/2017 CLINICAL DATA:  Follow-up hemothorax EXAM: PORTABLE CHEST 1 VIEW COMPARISON:  10/23/2017 FINDINGS: Cardiac shadow is enlarged but accentuated by the portable technique. Aortic calcifications are again noted. The lungs are well aerated bilaterally. Small effusions left greater than right are noted. The known left rib fractures are incompletely evaluated on this exam. IMPRESSION: Bilateral effusions left greater than right. Electronically Signed   By: Inez Catalina M.D.   On: 10/24/2017 10:15   Dg Chest Port 1 View  Result Date: 10/23/2017 CLINICAL DATA:  Confusion, fell yesterday. EXAM: PORTABLE CHEST 1 VIEW COMPARISON:  CT scan of the chest of November 15, 2015. FINDINGS: The lungs remain hyperinflated. There is a small left pleural effusion. There is no alveolar infiltrate. The heart and pulmonary vascularity are normal. There is tortuosity of the ascending and descending thoracic aorta. There is mural plaque in the thoracic aorta. The  bones are subjectively osteopenic. IMPRESSION: COPD.  Small left pleural effusion.  No pneumonia nor CHF. Thoracic aortic atherosclerosis. Electronically Signed   By: David  Martinique M.D.   On: 10/23/2017 13:50   Dg Hand Complete Right  Result Date: 10/23/2017 CLINICAL DATA:  Unwitnessed fall with bruising to the thumb and index fingers EXAM: RIGHT HAND - COMPLETE 3+ VIEW COMPARISON:  04/08/2015 FINDINGS: Bones appear osteopenic. Possible small avulsion fracture injury at the base of  the first proximal phalanx. Irregularity and lucency at sesamoid bone adjacent to head of first metacarpal. No subluxation. Moderate arthritis at the first [facility] joint. Prominent arthritis at the DIP and PIP joints with joint space narrowing, small erosions and osteophytes. IMPRESSION: Suspected fracture through sesamoid bone adjacent to the head of the first metacarpal. Possible small adjacent basilar avulsion involving the first proximal phalanx. Digital and first [facility] joint arthritis. Electronically Signed   By: Donavan Foil M.D.   On: 10/23/2017 17:05    Anti-infectives: Anti-infectives (From admission, onward)   Start     Dose/Rate Route Frequency Ordered Stop   10/23/17 2200  ceFAZolin (ANCEF) IVPB 1 g/50 mL premix     1 g 100 mL/hr over 30 Minutes Intravenous Every 12 hours 10/23/17 2133         Assessment/Plan Fall Left 9-12 rib fractures with small hemothorax - CXR yesterday with bilateral effusions, L > R - repeat CXR today  - IS, pulm toiletry - supplemental O2 if needed, continue pulse oximetry Left chest wall and back hematoma - Hgb stable, VSS Right 1st metacarpal fracture and possible first proximal phalanx fracture - ortho consulted, patient non-tender, splint if patient develops pain AKI on CKD stage IV - Cr 1.43, encourage fluid intake, recheck in AM UTI - Ancef q12h Anemia of chronic disease Osteoarthritis Dementia - having some sundowning/agitation, prn haldol  L thyroid nodule - f/u with  PCP L kidney mass - f/u with PCP  FEN: regular diet VTE: SCDs, SQ heparin  ID: Ancef 9/17>>  Dispo: PT/OT, Pt will need SNF at d/c. Repeat CXR  LOS: 2 days    Brigid Re , [facility] 10/25/2017, 8:34 AM Pager: [phone] Mon-Fri 7:00 am-4:30 pm Sat-Sun 7:00 am-11:30 am

## 2017-10-26 DIAGNOSIS — S301XXD Contusion of abdominal wall, subsequent encounter: Secondary | ICD-10-CM | POA: Diagnosis not present

## 2017-10-26 DIAGNOSIS — I2694 Multiple subsegmental pulmonary emboli without acute cor pulmonale: Secondary | ICD-10-CM | POA: Diagnosis not present

## 2017-10-26 DIAGNOSIS — M79641 Pain in right hand: Secondary | ICD-10-CM | POA: Diagnosis not present

## 2017-10-26 DIAGNOSIS — Z66 Do not resuscitate: Secondary | ICD-10-CM | POA: Diagnosis not present

## 2017-10-26 DIAGNOSIS — N289 Disorder of kidney and ureter, unspecified: Secondary | ICD-10-CM | POA: Diagnosis not present

## 2017-10-26 DIAGNOSIS — I82442 Acute embolism and thrombosis of left tibial vein: Secondary | ICD-10-CM | POA: Diagnosis not present

## 2017-10-26 DIAGNOSIS — R3915 Urgency of urination: Secondary | ICD-10-CM | POA: Diagnosis not present

## 2017-10-26 DIAGNOSIS — N179 Acute kidney failure, unspecified: Secondary | ICD-10-CM | POA: Diagnosis not present

## 2017-10-26 DIAGNOSIS — M1A9XX0 Chronic gout, unspecified, without tophus (tophi): Secondary | ICD-10-CM | POA: Diagnosis not present

## 2017-10-26 DIAGNOSIS — S62201D Unspecified fracture of first metacarpal bone, right hand, subsequent encounter for fracture with routine healing: Secondary | ICD-10-CM | POA: Diagnosis not present

## 2017-10-26 DIAGNOSIS — R2689 Other abnormalities of gait and mobility: Secondary | ICD-10-CM | POA: Diagnosis not present

## 2017-10-26 DIAGNOSIS — E041 Nontoxic single thyroid nodule: Secondary | ICD-10-CM | POA: Diagnosis not present

## 2017-10-26 DIAGNOSIS — I10 Essential (primary) hypertension: Secondary | ICD-10-CM | POA: Diagnosis not present

## 2017-10-26 DIAGNOSIS — D649 Anemia, unspecified: Secondary | ICD-10-CM | POA: Diagnosis not present

## 2017-10-26 DIAGNOSIS — Z7189 Other specified counseling: Secondary | ICD-10-CM | POA: Diagnosis not present

## 2017-10-26 DIAGNOSIS — G309 Alzheimer's disease, unspecified: Secondary | ICD-10-CM | POA: Diagnosis not present

## 2017-10-26 DIAGNOSIS — N390 Urinary tract infection, site not specified: Secondary | ICD-10-CM | POA: Diagnosis not present

## 2017-10-26 DIAGNOSIS — R0689 Other abnormalities of breathing: Secondary | ICD-10-CM | POA: Diagnosis not present

## 2017-10-26 DIAGNOSIS — D72829 Elevated white blood cell count, unspecified: Secondary | ICD-10-CM | POA: Diagnosis not present

## 2017-10-26 DIAGNOSIS — M6281 Muscle weakness (generalized): Secondary | ICD-10-CM | POA: Diagnosis not present

## 2017-10-26 DIAGNOSIS — Z7982 Long term (current) use of aspirin: Secondary | ICD-10-CM | POA: Diagnosis not present

## 2017-10-26 DIAGNOSIS — M25562 Pain in left knee: Secondary | ICD-10-CM | POA: Diagnosis not present

## 2017-10-26 DIAGNOSIS — F0281 Dementia in other diseases classified elsewhere with behavioral disturbance: Secondary | ICD-10-CM | POA: Diagnosis not present

## 2017-10-26 DIAGNOSIS — K219 Gastro-esophageal reflux disease without esophagitis: Secondary | ICD-10-CM | POA: Diagnosis not present

## 2017-10-26 DIAGNOSIS — R0902 Hypoxemia: Secondary | ICD-10-CM | POA: Diagnosis not present

## 2017-10-26 DIAGNOSIS — M1A3410 Chronic gout due to renal impairment, right hand, without tophus (tophi): Secondary | ICD-10-CM | POA: Diagnosis not present

## 2017-10-26 DIAGNOSIS — Z743 Need for continuous supervision: Secondary | ICD-10-CM | POA: Diagnosis not present

## 2017-10-26 DIAGNOSIS — S2242XA Multiple fractures of ribs, left side, initial encounter for closed fracture: Secondary | ICD-10-CM | POA: Diagnosis not present

## 2017-10-26 DIAGNOSIS — G4700 Insomnia, unspecified: Secondary | ICD-10-CM | POA: Diagnosis not present

## 2017-10-26 DIAGNOSIS — R0602 Shortness of breath: Secondary | ICD-10-CM | POA: Diagnosis not present

## 2017-10-26 DIAGNOSIS — N184 Chronic kidney disease, stage 4 (severe): Secondary | ICD-10-CM | POA: Diagnosis not present

## 2017-10-26 DIAGNOSIS — J90 Pleural effusion, not elsewhere classified: Secondary | ICD-10-CM | POA: Diagnosis not present

## 2017-10-26 DIAGNOSIS — W19XXXD Unspecified fall, subsequent encounter: Secondary | ICD-10-CM | POA: Diagnosis present

## 2017-10-26 DIAGNOSIS — R2681 Unsteadiness on feet: Secondary | ICD-10-CM | POA: Diagnosis not present

## 2017-10-26 DIAGNOSIS — I2699 Other pulmonary embolism without acute cor pulmonale: Secondary | ICD-10-CM | POA: Diagnosis not present

## 2017-10-26 DIAGNOSIS — M25561 Pain in right knee: Secondary | ICD-10-CM | POA: Diagnosis not present

## 2017-10-26 DIAGNOSIS — B379 Candidiasis, unspecified: Secondary | ICD-10-CM | POA: Diagnosis not present

## 2017-10-26 DIAGNOSIS — F0390 Unspecified dementia without behavioral disturbance: Secondary | ICD-10-CM | POA: Diagnosis present

## 2017-10-26 DIAGNOSIS — S20212S Contusion of left front wall of thorax, sequela: Secondary | ICD-10-CM | POA: Diagnosis not present

## 2017-10-26 DIAGNOSIS — E785 Hyperlipidemia, unspecified: Secondary | ICD-10-CM | POA: Diagnosis not present

## 2017-10-26 DIAGNOSIS — N2889 Other specified disorders of kidney and ureter: Secondary | ICD-10-CM | POA: Diagnosis not present

## 2017-10-26 DIAGNOSIS — Z9181 History of falling: Secondary | ICD-10-CM | POA: Diagnosis not present

## 2017-10-26 DIAGNOSIS — I2609 Other pulmonary embolism with acute cor pulmonale: Secondary | ICD-10-CM | POA: Diagnosis not present

## 2017-10-26 DIAGNOSIS — I82811 Embolism and thrombosis of superficial veins of right lower extremities: Secondary | ICD-10-CM | POA: Diagnosis not present

## 2017-10-26 DIAGNOSIS — G9341 Metabolic encephalopathy: Secondary | ICD-10-CM | POA: Diagnosis not present

## 2017-10-26 DIAGNOSIS — I129 Hypertensive chronic kidney disease with stage 1 through stage 4 chronic kidney disease, or unspecified chronic kidney disease: Secondary | ICD-10-CM | POA: Diagnosis not present

## 2017-10-26 DIAGNOSIS — Z515 Encounter for palliative care: Secondary | ICD-10-CM | POA: Diagnosis not present

## 2017-10-26 DIAGNOSIS — S2242XD Multiple fractures of ribs, left side, subsequent encounter for fracture with routine healing: Secondary | ICD-10-CM | POA: Diagnosis not present

## 2017-10-26 DIAGNOSIS — R079 Chest pain, unspecified: Secondary | ICD-10-CM | POA: Diagnosis not present

## 2017-10-26 DIAGNOSIS — R0603 Acute respiratory distress: Secondary | ICD-10-CM | POA: Diagnosis not present

## 2017-10-26 DIAGNOSIS — M810 Age-related osteoporosis without current pathological fracture: Secondary | ICD-10-CM | POA: Diagnosis not present

## 2017-10-26 LAB — BASIC METABOLIC PANEL
ANION GAP: 9 (ref 5–15)
BUN: 21 mg/dL (ref 8–23)
CALCIUM: 9.2 mg/dL (ref 8.9–10.3)
CO2: 22 mmol/L (ref 22–32)
Chloride: 111 mmol/L (ref 98–111)
Creatinine, Ser: 1.22 mg/dL — ABNORMAL HIGH (ref 0.44–1.00)
GFR calc Af Amer: 43 mL/min — ABNORMAL LOW (ref >60)
GFR calc non Af Amer: 37 mL/min — ABNORMAL LOW (ref >60)
GLUCOSE: 93 mg/dL (ref 70–99)
Potassium: 3.9 mmol/L (ref 3.5–5.1)
Sodium: 142 mmol/L (ref 135–145)

## 2017-10-26 MED ORDER — TRAMADOL HCL 50 MG PO TABS: 25.0000 mg | ORAL_TABLET | Freq: Four times a day (QID) | ORAL | 0 refills | Status: DC | PRN

## 2017-10-26 MED ORDER — ACETAMINOPHEN 325 MG PO TABS: 650.0000 mg | ORAL_TABLET | ORAL | Status: AC | PRN

## 2017-10-26 MED ORDER — DOCUSATE SODIUM 100 MG PO CAPS: 100.0000 mg | ORAL_CAPSULE | Freq: Two times a day (BID) | ORAL | 0 refills | Status: AC

## 2017-10-26 NOTE — Progress Notes (Signed)
Discharge to [facility] transported by [facility].

## 2017-10-26 NOTE — Clinical Social Work Note (Signed)
Shannon Berg has received insurance authorization. Pt can d/c to facility today.   [place], [place]

## 2017-10-26 NOTE — Care Management Note (Signed)
Case Management Note  Patient Details  Name: Shannon Berg MRN: [id number] Date of Birth: January 21, 1923  Subjective/Objective:  Pt is a 84 y/o female admitted secondary to a fall in which she sustained Left 9-12 rib fractures with small hemothorax and Right 1st metacarpal fracture and possible first proximal phalanx fracture.  PTA, pt independent, lives at [facility].                    Action/Plan: PT/OT recommending SNF at dc; CSW consulted to facilitate dc to SNF upon medical stability.   Expected Discharge Date:  10/26/17               Expected Discharge Plan:  Skilled Nursing Facility  In-House Referral:  Clinical Social Work  Discharge planning Services  CM Consult  Post Acute Care Choice:    Choice offered to:     DME Arranged:    DME Agency:     HH Arranged:    [place] Agency:     Status of Service:  Completed, signed off  If discussed at [employer], dates discussed:    Additional Comments:  10/26/17 J. Khairi Garman, RN, BSN Pt medically stable for dc today; plan dc to SNF today, per CSW arrangements.  Reinaldo Raddle, RN, BSN  Trauma/Neuro ICU Case Manager [phone]

## 2017-10-26 NOTE — Clinical Social Work Note (Signed)
Clinical Social Worker facilitated patient discharge including contacting patient family and facility to confirm patient discharge plans.  Clinical information faxed to facility and family agreeable with plan.  CSW arranged ambulance transport via [facility] to [facility] (room 907 P).  RN to call [phone] for report prior to discharge.  Clinical Social Worker will sign off for now as social work intervention is no longer needed. Please consult [place] again if new need arises.  [place], [place]

## 2017-10-26 NOTE — Clinical Social Work Placement (Signed)
   CLINICAL SOCIAL WORK PLACEMENT  NOTE  Date:  10/26/2017  Patient Details  Name: Shannon Berg MRN: [id number] Date of Birth: 04/08/1922  Clinical Social Work is seeking post-discharge placement for this patient at the [place] level of care (*CSW will initial, date and re-position this form in  chart as items are completed):  Yes   Patient/family provided with [place] Work Department's list of facilities offering this level of care within the geographic area requested by the patient (or if unable, by the patient's family).  Yes   Patient/family informed of their freedom to choose among providers that offer the needed level of care, that participate in Medicare, Medicaid or managed care program needed by the patient, have an available bed and are willing to accept the patient.  Yes   Patient/family informed of [place]'s ownership interest in [facility] and [facility], as well as of the fact that they are under no obligation to receive care at these facilities.  PASRR submitted to EDS on       PASRR number received on 10/24/17     Existing PASRR number confirmed on       FL2 transmitted to all facilities in geographic area requested by pt/family on 10/24/17     FL2 transmitted to all facilities within larger geographic area on       Patient informed that his/her managed care company has contracts with or will negotiate with certain facilities, including the following:        Yes   Patient/family informed of bed offers received.  Patient chooses bed at [facility]     Physician recommends and patient chooses bed at      Patient to be transferred to [facility] on 10/26/17.  Patient to be transferred to facility by [facility]     Patient family notified on 10/26/17 of transfer.  Name of family member notified:  pt daughter [name]     PHYSICIAN Please prepare priority discharge summary, including medications, Please prepare  prescriptions     Additional Comment:    _______________________________________________ Eileen Stanford, LCSW 10/26/2017, 12:04 PM

## 2017-10-26 NOTE — Progress Notes (Signed)
Report given to Lonia Farber, RN at [facility] and rehab. Awaiting [facility] for transport.

## 2017-10-26 NOTE — Discharge Summary (Signed)
Physician Discharge Summary  Patient ID: Shannon Berg MRN: [id number] DOB/AGE: 15-Feb-1922 84 y.o.  Admit date: 10/23/2017 Discharge date: 10/26/2017  Discharge Diagnoses Fall Left 9-12 rib fractures with small hemothorax Left flank/back hematoma Right 1st metacarpal fracture and possible 1st proximal phalanx fracture AKI on CKD stage IV, improving UTI Anemia of chronic disease Osteoarthritis Dementia Left thyroid nodule Left kidney mass  Consultants Orthopedic surgery  Procedures None  HPI: Patient is a 84 year old female who sustained a fall 10/22/17 in her apartment in assisted living. She was found to be confused today when staff checked on her. She was getting up and fell, striking a table on her left side. She complained of severe left chest/abdominal pain. She/family/staff deny recent illnesses or fevers/chills. She has not fallen like this recently. She does have a history of vertigo. She also has a history of chronic kidney disease. She denies shortness of breath. She has baseline dementia, but is generally fairly functional. She also complained of right thumb pain. Workup in the ED revealed left rib fractures with small hemothorax, left sided hematoma, and right hand fractures.  Hospital Course: Patient was admitted to the trauma service for pain control, PT/OT and observation of overall pulmonary status. Orthopedics was consulted for hand fractures and patient was without pain at the time of exam, they recommended splinting hand if the patient developed pain. Patient's pulmonary status improved with IS and pulmonary toileting. Patient treated for UTI based on results of urinalysis. Patient had some mild hospital delirium which was treated with low dose haldol as needed and family arranged for private sitter. PT/OT evaluated patient and felt that she would need SNF for continued therapy at discharge.   On 10/26/2017 patient was tolerating a diet, pain well controlled, VSS and  labs stable. She is felt stable for discharge to SNF. She is discharged in good condition and should follow up as outlined below. Family is aware to call with questions or concerns.    Allergies as of 10/26/2017   No Known Allergies     Medication List    TAKE these medications   acetaminophen 325 MG tablet Commonly known as:  TYLENOL Take 2 tablets (650 mg total) by mouth every 4 (four) hours as needed for mild pain.   allopurinol 100 MG tablet Commonly known as:  ZYLOPRIM Take 1 tablet (100 mg total) by mouth daily.   aspirin EC 81 MG tablet Take 81 mg by mouth every other day.   diclofenac sodium 1 % Gel Commonly known as:  VOLTAREN Apply 2 g topically 4 (four) times daily. To affected hands as needed   docusate sodium 100 MG capsule Commonly known as:  COLACE Take 1 capsule (100 mg total) by mouth 2 (two) times daily.   simvastatin 10 MG tablet Commonly known as:  ZOCOR TAKE 1 TABLET ONCE DAILY.   traMADol 50 MG tablet Commonly known as:  ULTRAM Take 0.5 tablets (25 mg total) by mouth every 6 (six) hours as needed for moderate pain or severe pain.   TRAVEL SICKNESS 25 MG Chew Generic drug:  Meclizine HCl TAKE 1/2 TABLET THREE TIMES DAILY AS NEEDED FOR DIZZINESS.   Vitamin D3 2000 units capsule Take 1 capsule (2,000 Units total) by mouth daily.         Contact information for follow-up providers    Roseanne Kaufman, MD. Call.   Specialty:  Orthopedic Surgery Why:  Follow up as needed for right hand pain Contact information: [address] [place] [address] [phone]        [facility] Follow up.   Why:  No follow up scheduled. Call as needed with questions or concerns.  Contact information: [address] [address] [phone]       Hollace Kinnier L, DO Follow up.   Specialty:  Geriatric Medicine Why:  Follow up for post-hospital visit Contact information: [address]. [place] [address] [phone]            Contact information for after-discharge care    Destination    [facility]erred [facility] .   Service:  Skilled Nursing Contact information: [place]                  Signed: Brigid Re , [facility] Surgery 10/26/2017, 10:17 AM Pager: [phone] Mon-Fri 7:00 am-4:30 pm Sat-Sun 7:00 am-11:30 am

## 2017-10-26 NOTE — Progress Notes (Signed)
[facility] Progress Note     Subjective: CC: no complaints Had some agitation overnight per private sitter, Pt kept stating she needed her purse and she needed to get home. Patient pleasant this AM. Denies pain and denies SOB. Not able to understand IS well.   Objective: Vital signs in last 24 hours: Temp:  [97.5 F (36.4 C)-98.6 F (37 C)] 98 F (36.7 C) (09/20 0559) Pulse Rate:  [80-103] 80 (09/20 0559) Resp:  [18-20] 20 (09/20 0559) BP: (124-165)/(65-81) 165/81 (09/20 0559) SpO2:  [91 %-100 %] 100 % (09/20 0559) Last BM Date: 10/22/17  Intake/Output from previous day: 09/19 0701 - 09/20 0700 In: 1214.9 [P.O.:600; IV Piggyback:614.9] Out: -  Intake/Output this shift: No intake/output data recorded.  PE: Gen: NAD, pleasant Card: Regular rate and rhythm, pedal pulses 2+ BL Pulm: Normal effort, clear to auscultation bilaterally Abd: Soft, non-tender, non-distended, bowel sounds present; L flank/back ecchymosis  Ext: ecchymosis of R hand without TTP Skin: warm and dry, no rashes  Psych:pleasantly demented  Lab Results:  Recent Labs    10/24/17 0550 10/25/17 0423  WBC 7.9 8.8  HGB 11.1* 12.0  HCT 36.0 39.1  PLT 165 194   BMET Recent Labs    10/24/17 0550 10/25/17 0423  NA 140 140  K 3.8 3.6  CL 107 106  CO2 22 22  GLUCOSE 137* 122*  BUN 21 21  CREATININE 1.32* 1.43*  CALCIUM 9.5 9.5   PT/INR No results for input(s): LABPROT, INR in the last 72 hours. CMP     Component Value Date/Time   NA 140 10/25/2017 0423   NA 143 07/19/2015 1018   K 3.6 10/25/2017 0423   CL 106 10/25/2017 0423   CO2 22 10/25/2017 0423   GLUCOSE 122 (H) 10/25/2017 0423   BUN 21 10/25/2017 0423   BUN 30 07/19/2015 1018   CREATININE 1.43 (H) 10/25/2017 0423   CREATININE 1.52 (H) 06/14/2017 1615   CALCIUM 9.5 10/25/2017 0423   PROT 7.3 10/23/2017 1358   PROT 7.0 07/19/2015 1018   ALBUMIN 4.0 10/23/2017 1358   ALBUMIN 4.5 07/19/2015 1018   AST 28 10/23/2017  1358   ALT 16 10/23/2017 1358   ALKPHOS 67 10/23/2017 1358   BILITOT 3.6 (H) 10/23/2017 1358   BILITOT 1.3 (H) 07/19/2015 1018   GFRNONAA 31 (L) 10/25/2017 0423   GFRNONAA 34 (L) 11/16/2016 1028   GFRAA 35 (L) 10/25/2017 0423   GFRAA 40 (L) 11/16/2016 1028   Lipase  No results found for: LIPASE     Studies/Results: Dg Chest Port 1 View  Result Date: 10/25/2017 CLINICAL DATA:  Short of breath, effusions, follow-up EXAM: PORTABLE CHEST 1 VIEW COMPARISON:  Portable chest x-ray of 10/24/2017 and 10/23/2017, recent fall with left rib fractures FINDINGS: Lungs are relatively well aerated. Basilar opacities consistent with atelectasis and effusions remain left-greater-than-right. Pneumonia at the left lung base would be difficult to exclude. The heart is mildly enlarged and stable. There are left rib fractures again noted. IMPRESSION: 1. Persistent bibasilar opacities most consistent with atelectasis and effusion. Left basilar pneumonia cannot be excluded. 2. Stable mild cardiomegaly. Electronically Signed   By: Ivar Drape M.D.   On: 10/25/2017 10:24    Anti-infectives: Anti-infectives (From admission, onward)   Start     Dose/Rate Route Frequency Ordered Stop   10/23/17 2200  ceFAZolin (ANCEF) IVPB 1 g/50 mL premix     1 g 100 mL/hr over 30 Minutes Intravenous Every 12 hours 10/23/17 2133  Assessment/Plan Fall Left 9-12 rib fractures with small hemothorax- CXR yesterday with slightly improved atelectasis and effusion - IS, pulm toiletry Left chest wall and back hematoma- Hgb stable, VSS Right1st metacarpal fracture and possible first proximal phalanx fracture- ortho consulted, patient non-tender, splint if patient develops pain AKI on CKD stage IV- Cr 1.43 yesterday, recheck today UTI- d/c Ancef today Anemia of chronic disease Osteoarthritis Dementia- improvement in sundowning/agitation, prn haldol  L thyroid nodule- f/u with PCP L kidney mass- f/u with  PCP  FEN: regular diet VTE: SCDs, SQ heparin ID: Ancef 9/17>9/20  Dispo: [employer] when insurance authorization complete  LOS: 3 days    Brigid Re , [facility] Surgery 10/26/2017, 7:32 AM Pager: [phone] Trauma Pager: [phone] Mon-Fri 7:00 am-4:30 pm Sat-Sun 7:00 am-11:30 am

## 2017-10-29 DIAGNOSIS — S62201D Unspecified fracture of first metacarpal bone, right hand, subsequent encounter for fracture with routine healing: Secondary | ICD-10-CM | POA: Diagnosis not present

## 2017-10-29 DIAGNOSIS — S301XXD Contusion of abdominal wall, subsequent encounter: Secondary | ICD-10-CM | POA: Diagnosis not present

## 2017-10-29 DIAGNOSIS — N184 Chronic kidney disease, stage 4 (severe): Secondary | ICD-10-CM | POA: Diagnosis not present

## 2017-10-29 DIAGNOSIS — M79641 Pain in right hand: Secondary | ICD-10-CM | POA: Diagnosis not present

## 2017-10-29 DIAGNOSIS — M25561 Pain in right knee: Secondary | ICD-10-CM | POA: Diagnosis not present

## 2017-10-29 DIAGNOSIS — R079 Chest pain, unspecified: Secondary | ICD-10-CM | POA: Diagnosis not present

## 2017-10-29 DIAGNOSIS — M25562 Pain in left knee: Secondary | ICD-10-CM | POA: Diagnosis not present

## 2017-10-29 DIAGNOSIS — R2681 Unsteadiness on feet: Secondary | ICD-10-CM | POA: Diagnosis not present

## 2017-10-29 DIAGNOSIS — S2242XD Multiple fractures of ribs, left side, subsequent encounter for fracture with routine healing: Secondary | ICD-10-CM | POA: Diagnosis not present

## 2017-11-02 DIAGNOSIS — M25562 Pain in left knee: Secondary | ICD-10-CM | POA: Diagnosis not present

## 2017-11-02 DIAGNOSIS — M79641 Pain in right hand: Secondary | ICD-10-CM | POA: Diagnosis not present

## 2017-11-02 DIAGNOSIS — R3915 Urgency of urination: Secondary | ICD-10-CM | POA: Diagnosis not present

## 2017-11-02 DIAGNOSIS — R079 Chest pain, unspecified: Secondary | ICD-10-CM | POA: Diagnosis not present

## 2017-11-02 DIAGNOSIS — R2681 Unsteadiness on feet: Secondary | ICD-10-CM | POA: Diagnosis not present

## 2017-11-02 DIAGNOSIS — M25561 Pain in right knee: Secondary | ICD-10-CM | POA: Diagnosis not present

## 2017-11-05 DIAGNOSIS — G4700 Insomnia, unspecified: Secondary | ICD-10-CM | POA: Diagnosis not present

## 2017-11-05 DIAGNOSIS — R3915 Urgency of urination: Secondary | ICD-10-CM | POA: Diagnosis not present

## 2017-11-07 DIAGNOSIS — B379 Candidiasis, unspecified: Secondary | ICD-10-CM | POA: Diagnosis not present

## 2017-11-11 ENCOUNTER — Inpatient Hospital Stay (HOSPITAL_COMMUNITY)
Admission: EM | Admit: 2017-11-11 | Discharge: 2017-12-07 | DRG: 175 | Disposition: E | Payer: Medicare Other | Source: Skilled Nursing Facility | Attending: Internal Medicine | Admitting: Internal Medicine

## 2017-11-11 ENCOUNTER — Encounter (HOSPITAL_COMMUNITY): Payer: Self-pay | Admitting: Emergency Medicine

## 2017-11-11 ENCOUNTER — Emergency Department (HOSPITAL_COMMUNITY): Payer: Medicare Other

## 2017-11-11 DIAGNOSIS — E041 Nontoxic single thyroid nodule: Secondary | ICD-10-CM | POA: Diagnosis present

## 2017-11-11 DIAGNOSIS — D72829 Elevated white blood cell count, unspecified: Secondary | ICD-10-CM | POA: Diagnosis not present

## 2017-11-11 DIAGNOSIS — I129 Hypertensive chronic kidney disease with stage 1 through stage 4 chronic kidney disease, or unspecified chronic kidney disease: Secondary | ICD-10-CM | POA: Diagnosis present

## 2017-11-11 DIAGNOSIS — I82442 Acute embolism and thrombosis of left tibial vein: Secondary | ICD-10-CM | POA: Diagnosis present

## 2017-11-11 DIAGNOSIS — J90 Pleural effusion, not elsewhere classified: Secondary | ICD-10-CM | POA: Diagnosis not present

## 2017-11-11 DIAGNOSIS — K219 Gastro-esophageal reflux disease without esophagitis: Secondary | ICD-10-CM | POA: Diagnosis not present

## 2017-11-11 DIAGNOSIS — R0603 Acute respiratory distress: Secondary | ICD-10-CM | POA: Diagnosis present

## 2017-11-11 DIAGNOSIS — N2889 Other specified disorders of kidney and ureter: Secondary | ICD-10-CM | POA: Diagnosis not present

## 2017-11-11 DIAGNOSIS — E785 Hyperlipidemia, unspecified: Secondary | ICD-10-CM | POA: Diagnosis present

## 2017-11-11 DIAGNOSIS — Z515 Encounter for palliative care: Secondary | ICD-10-CM | POA: Diagnosis not present

## 2017-11-11 DIAGNOSIS — G309 Alzheimer's disease, unspecified: Secondary | ICD-10-CM | POA: Diagnosis not present

## 2017-11-11 DIAGNOSIS — M810 Age-related osteoporosis without current pathological fracture: Secondary | ICD-10-CM | POA: Diagnosis not present

## 2017-11-11 DIAGNOSIS — I10 Essential (primary) hypertension: Secondary | ICD-10-CM | POA: Diagnosis present

## 2017-11-11 DIAGNOSIS — Z7982 Long term (current) use of aspirin: Secondary | ICD-10-CM

## 2017-11-11 DIAGNOSIS — M1A9XX0 Chronic gout, unspecified, without tophus (tophi): Secondary | ICD-10-CM | POA: Diagnosis not present

## 2017-11-11 DIAGNOSIS — Z66 Do not resuscitate: Secondary | ICD-10-CM | POA: Diagnosis present

## 2017-11-11 DIAGNOSIS — S2242XD Multiple fractures of ribs, left side, subsequent encounter for fracture with routine healing: Secondary | ICD-10-CM

## 2017-11-11 DIAGNOSIS — Z7189 Other specified counseling: Secondary | ICD-10-CM

## 2017-11-11 DIAGNOSIS — N390 Urinary tract infection, site not specified: Secondary | ICD-10-CM | POA: Diagnosis not present

## 2017-11-11 DIAGNOSIS — I2609 Other pulmonary embolism with acute cor pulmonale: Secondary | ICD-10-CM | POA: Diagnosis not present

## 2017-11-11 DIAGNOSIS — D649 Anemia, unspecified: Secondary | ICD-10-CM | POA: Diagnosis not present

## 2017-11-11 DIAGNOSIS — F0281 Dementia in other diseases classified elsewhere with behavioral disturbance: Secondary | ICD-10-CM | POA: Diagnosis not present

## 2017-11-11 DIAGNOSIS — M1A3410 Chronic gout due to renal impairment, right hand, without tophus (tophi): Secondary | ICD-10-CM | POA: Diagnosis not present

## 2017-11-11 DIAGNOSIS — R0902 Hypoxemia: Secondary | ICD-10-CM | POA: Diagnosis not present

## 2017-11-11 DIAGNOSIS — I82811 Embolism and thrombosis of superficial veins of right lower extremities: Secondary | ICD-10-CM | POA: Diagnosis present

## 2017-11-11 DIAGNOSIS — G9341 Metabolic encephalopathy: Secondary | ICD-10-CM | POA: Diagnosis not present

## 2017-11-11 DIAGNOSIS — N184 Chronic kidney disease, stage 4 (severe): Secondary | ICD-10-CM | POA: Diagnosis present

## 2017-11-11 DIAGNOSIS — S62201D Unspecified fracture of first metacarpal bone, right hand, subsequent encounter for fracture with routine healing: Secondary | ICD-10-CM

## 2017-11-11 DIAGNOSIS — W19XXXD Unspecified fall, subsequent encounter: Secondary | ICD-10-CM | POA: Diagnosis present

## 2017-11-11 DIAGNOSIS — I2699 Other pulmonary embolism without acute cor pulmonale: Secondary | ICD-10-CM | POA: Diagnosis not present

## 2017-11-11 DIAGNOSIS — N289 Disorder of kidney and ureter, unspecified: Secondary | ICD-10-CM | POA: Diagnosis not present

## 2017-11-11 DIAGNOSIS — R0689 Other abnormalities of breathing: Secondary | ICD-10-CM | POA: Diagnosis not present

## 2017-11-11 DIAGNOSIS — R0602 Shortness of breath: Secondary | ICD-10-CM | POA: Diagnosis not present

## 2017-11-11 DIAGNOSIS — F0390 Unspecified dementia without behavioral disturbance: Secondary | ICD-10-CM | POA: Diagnosis present

## 2017-11-11 DIAGNOSIS — Z743 Need for continuous supervision: Secondary | ICD-10-CM | POA: Diagnosis not present

## 2017-11-11 DIAGNOSIS — I2694 Multiple subsegmental pulmonary emboli without acute cor pulmonale: Secondary | ICD-10-CM | POA: Diagnosis not present

## 2017-11-11 HISTORY — DX: Gout, unspecified: M10.9

## 2017-11-11 HISTORY — DX: Unspecified dementia, unspecified severity, without behavioral disturbance, psychotic disturbance, mood disturbance, and anxiety: F03.90

## 2017-11-11 LAB — CBC WITH DIFFERENTIAL/PLATELET
Abs Immature Granulocytes: 0.1 10*3/uL (ref 0.0–0.1)
Basophils Absolute: 0 10*3/uL (ref 0.0–0.1)
Basophils Relative: 0 %
EOS ABS: 0.1 10*3/uL (ref 0.0–0.7)
EOS PCT: 1 %
HEMATOCRIT: 36.6 % (ref 36.0–46.0)
HEMOGLOBIN: 11.2 g/dL — AB (ref 12.0–15.0)
IMMATURE GRANULOCYTES: 1 %
LYMPHS ABS: 1.5 10*3/uL (ref 0.7–4.0)
Lymphocytes Relative: 10 %
MCH: 26.9 pg (ref 26.0–34.0)
MCHC: 30.6 g/dL (ref 30.0–36.0)
MCV: 87.8 fL (ref 78.0–100.0)
MONOS PCT: 8 %
Monocytes Absolute: 1.2 10*3/uL — ABNORMAL HIGH (ref 0.1–1.0)
Neutro Abs: 11.7 10*3/uL — ABNORMAL HIGH (ref 1.7–7.7)
Neutrophils Relative %: 80 %
Platelets: 221 10*3/uL (ref 150–400)
RBC: 4.17 MIL/uL (ref 3.87–5.11)
RDW: 14.2 % (ref 11.5–15.5)
WBC: 14.6 10*3/uL — AB (ref 4.0–10.5)

## 2017-11-11 LAB — COMPREHENSIVE METABOLIC PANEL
ALBUMIN: 3.4 g/dL — AB (ref 3.5–5.0)
ALK PHOS: 90 U/L (ref 38–126)
ALT: 22 U/L (ref 0–44)
AST: 25 U/L (ref 15–41)
Anion gap: 8 (ref 5–15)
BILIRUBIN TOTAL: 2.3 mg/dL — AB (ref 0.3–1.2)
BUN: 20 mg/dL (ref 8–23)
CO2: 21 mmol/L — ABNORMAL LOW (ref 22–32)
Calcium: 10 mg/dL (ref 8.9–10.3)
Chloride: 110 mmol/L (ref 98–111)
Creatinine, Ser: 1.1 mg/dL — ABNORMAL HIGH (ref 0.44–1.00)
GFR calc Af Amer: 49 mL/min — ABNORMAL LOW (ref >60)
GFR, EST NON AFRICAN AMERICAN: 42 mL/min — AB (ref >60)
GLUCOSE: 144 mg/dL — AB (ref 70–99)
Potassium: 3.8 mmol/L (ref 3.5–5.1)
Sodium: 139 mmol/L (ref 135–145)
TOTAL PROTEIN: 6.7 g/dL (ref 6.5–8.1)

## 2017-11-11 LAB — I-STAT TROPONIN, ED: Troponin i, poc: 0 ng/mL (ref 0.00–0.08)

## 2017-11-11 NOTE — ED Triage Notes (Signed)
Patient arrived with EMS from [facility] home staff reported SOB with occasional produtive cough today , denies fever or chills . She received Albuterol 5 mg nebulizer by EMS with mild relief . O2 sat= 93% room air at arrival .

## 2017-11-11 NOTE — ED Notes (Signed)
Pt placed on PW

## 2017-11-11 NOTE — ED Provider Notes (Signed)
[facility] EMERGENCY DEPARTMENT Provider Note   CSN: [id number] Arrival date & time: 11/29/2017  2236     History   Chief Complaint Chief Complaint  Patient presents with  . Shortness of Breath    HPI Shannon Berg is a 84 y.o. female.   The history is provided by the patient and a relative.  She has history of hypertension, hyperlipidemia, dementia, and comes in because of difficulty breathing which started this evening.  She is in a rehab facility following a fall with fractures of 4 ribs.  She stays in bed and in a wheelchair except for her rehabilitation sessions.  She does admit to a slight cough, but tries to avoid cough because it makes pain from her rib fractures worse.  There has been no known fever.  EMS had given her a nebulizer treatment with albuterol with very slight relief.  Past Medical History:  Diagnosis Date  . Calculus of kidney   . Dementia ([place])   . Diverticulosis of colon (without mention of hemorrhage)   . Dizziness and giddiness   . Edema   . Essential hypertension, benign   . Gout   . Hyperlipidemia LDL goal < 100   . Other and unspecified hyperlipidemia   . Other B-complex deficiencies   . Other nonspecific abnormal serum enzyme levels   . Other specified disorder of skin   . Pain in joint, lower leg   . Reflux esophagitis   . Senile osteoporosis   . Unspecified disorder of kidney and ureter     Patient Active Problem List   Diagnosis Date Noted  . Multiple fractures of ribs of left side 10/23/2017  . Vascular dementia ([place]) 11/16/2016  . Obesity (BMI 30.0-34.9) 11/16/2016  . 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 Epidermoid cyst of skin 02/22/2016  . BPPV (benign paroxysmal positional vertigo) 12/14/2014  . Chronic kidney disease, stage IV (severe) ([place]) 08/11/2013  . Primary osteoarthritis of knee 08/11/2013  . Allergic rhinitis 05/12/2013  . Sinusitis, acute maxillary  05/12/2013  . B12 deficiency anemia 04/10/2013  . Essential hypertension, benign   . Hyperlipidemia   . OVARIAN CYST 06/29/2007  . NEPHROLITHIASIS, HX OF 06/29/2007    Past Surgical History:  Procedure Laterality Date  . EXCISION OF ADNEXAL MASS  2007  . FRACTURE SURGERY N/A 1984   wrist  . GANGLION CYST EXCISION  1974     OB History   None      Home Medications    Prior to Admission medications   Medication Sig Start Date End Date Taking? Authorizing Provider  acetaminophen (TYLENOL) 325 MG tablet Take 2 tablets (650 mg total) by mouth every 4 (four) hours as needed for mild pain. 10/26/17   Rayburn, Floyce Stakes, PA-C  allopurinol (ZYLOPRIM) 100 MG tablet Take 1 tablet (100 mg total) by mouth daily. 08/30/17   Reed, Tiffany L, DO  aspirin EC 81 MG tablet Take 81 mg by mouth every other day.     [provider]  Cholecalciferol (VITAMIN D3) 2000 UNITS capsule Take 1 capsule (2,000 Units total) by mouth daily. 12/14/14   Reed, Tiffany L, DO  diclofenac sodium (VOLTAREN) 1 % GEL Apply 2 g topically 4 (four) times daily. To affected hands as needed 02/28/16   Reed, Tiffany L, DO  docusate sodium (COLACE) 100 MG capsule Take 1 capsule (100 mg total) by mouth 2 (two) times daily. 10/26/17   Rayburn,  Kelly A, PA-C  simvastatin (ZOCOR) 10 MG tablet TAKE 1 TABLET ONCE DAILY. 09/19/17   Reed, Tiffany L, DO  traMADol (ULTRAM) 50 MG tablet Take 0.5 tablets (25 mg total) by mouth every 6 (six) hours as needed for moderate pain or severe pain. 10/26/17   Rayburn, Floyce Stakes, PA-C  TRAVEL SICKNESS 25 MG CHEW TAKE 1/2 TABLET THREE TIMES DAILY AS NEEDED FOR DIZZINESS. 05/24/17   Gayland Curry, DO    Family History Family History  Problem Relation Age of Onset  . Cancer Brother     Social History Social History   Tobacco Use  . Smoking status: Never Smoker  . Smokeless tobacco: Never Used  Substance Use Topics  . Alcohol use: No    Alcohol/week: 0.0 standard drinks  . Drug use: No      Allergies   Patient has no known allergies.   Review of Systems Review of Systems  All other systems reviewed and are negative.    Physical Exam Updated Vital Signs Pulse (!) 106   Temp 98.2 F (36.8 C) (Oral)   Resp (!) 24   SpO2 94%   Physical Exam  Nursing note and vitals reviewed.  84 year old female, resting comfortably and in no acute distress. Vital signs are significant for elevated heart rate and respiratory rate. Oxygen saturation is 94%, which is normal. Head is normocephalic and atraumatic. PERRLA, EOMI. Oropharynx is clear. Neck is nontender and supple without adenopathy or JVD. Back is nontender and there is no CVA tenderness. Lungs are clear without rales, wheezes, or rhonchi. Chest has significant tenderness in the left posterior lateral rib cage. Heart has regular rate and rhythm without murmur. Abdomen is soft, flat, nontender without masses or hepatosplenomegaly and peristalsis is normoactive. Extremities have no cyanosis or edema, full range of motion is present. Skin is warm and dry without rash. Neurologic: Mental status is normal, cranial nerves are intact, there are no motor or sensory deficits.  ED Treatments / Results  Labs (all labs ordered are listed, but only abnormal results are displayed) Labs Reviewed  CBC WITH DIFFERENTIAL/PLATELET - Abnormal; Notable for the following components:      Result Value   WBC 14.6 (*)    Hemoglobin 11.2 (*)    Neutro Abs 11.7 (*)    Monocytes Absolute 1.2 (*)    All other components within normal limits  COMPREHENSIVE METABOLIC PANEL - Abnormal; Notable for the following components:   CO2 21 (*)    Glucose, Bld 144 (*)    Creatinine, Ser 1.10 (*)    Albumin 3.4 (*)    Total Bilirubin 2.3 (*)    GFR calc non Af Amer 42 (*)    GFR calc Af Amer 49 (*)    All other components within normal limits  D-DIMER, QUANTITATIVE (NOT AT [facility]) - Abnormal; Notable for the following components:   D-Dimer, Quant  11.63 (*)    All other components within normal limits  BRAIN NATRIURETIC PEPTIDE  HEPARIN LEVEL (UNFRACTIONATED)  I-STAT TROPONIN, ED    EKG Interpretation  Date/Time:  Sunday November 11 2017 22:41:02 EDT Ventricular Rate:  111 PR Interval:    QRS Duration: 92 QT Interval:  328 QTC Calculation: 446 R Axis:   -56 Text Interpretation:  Sinus tachycardia Left anterior fascicular block Minimal ST depression Baseline wander in lead(s) II aVF When compared with ECG of 09/29/2005, Left anterior fasicular block is now present Reconfirmed by Delora Fuel ([id number]) on 11/23/2017 11:26:30  PM       Radiology Dg Chest 2 View  Result Date: 11/30/2017 CLINICAL DATA:  Shortness of breath with occasional productive cough EXAM: CHEST - 2 VIEW COMPARISON:  10/25/2017 FINDINGS: Mild enlargement of the cardiopericardial silhouette noted. Bilateral pleural effusions, moderate on the left and small on the right. Atherosclerotic calcification of the aortic arch. Mild increase in interstitial accentuation in the lung apices. The patient is rotated to the left on today's radiograph, reducing diagnostic sensitivity and specificity. Bony demineralization. Mild thoracic kyphosis. IMPRESSION: 1. Moderate-sized left and small right pleural effusions, essentially stable. 2. Mild enlargement of the cardiopericardial silhouette. Upper zone pulmonary vascular indistinctness suggesting pulmonary venous hypertension without overt edema. 3.  Atherosclerotic calcification of the aortic arch. 4. Bony demineralization. Electronically Signed   By: Van Clines M.D.   On: 11/17/2017 23:15   Ct Angio Chest Pe W And/or Wo Contrast  Result Date: 11/12/2017 CLINICAL DATA:  84 year old with shortness of breath. Elevated D-dimer. EXAM: CT ANGIOGRAPHY CHEST WITH CONTRAST TECHNIQUE: Multidetector CT imaging of the chest was performed using the standard protocol during bolus administration of intravenous contrast. Multiplanar CT image  reconstructions and MIPs were obtained to evaluate the vascular anatomy. CONTRAST:  64 cc ISOVUE-370 IOPAMIDOL (ISOVUE-370) INJECTION 76% COMPARISON:  Chest radiograph yesterday.  Chest CT 10/23/2017 FINDINGS: Cardiovascular: Examination is positive for pulmonary embolus, moderate thromboembolic burden. On the right there are filling defects in the right middle and lower lobar branches, extending into the segmental and subsegmental branches. Segmental involvement of the right upper lobe involving subsegmental branches. On the left filling defects in the segmental upper lobe, and subsegmental left lower lobe. There is dilatation of the right main pulmonary artery of 3.6 cm, unchanged from prior exam. There is straightening of the intraventricular septum with RV to LV ratio of 1.14. Atherosclerosis of the thoracic aorta without dissection. There are coronary artery calcifications. No pericardial effusion. Mediastinum/Nodes: No enlarged mediastinal or hilar lymph nodes. Left thyroid nodule as before. Trachea and mainstem bronchi are patent. Lungs/Pleura: Slight increase in left hemothorax from prior exam, adjacent compressive atelectasis in the left lower lobe. Small simple right pleural effusion with adjacent atelectasis. Peripheral ground-glass opacity in the right middle lobe suspicious for pulmonary infarct. There is breathing motion artifact limiting assessment. Upper Abdomen: No acute finding. Musculoskeletal: Unknown displaced left lower rib fractures her only partially included in the field of view. There is also callus formation about nondisplaced tenth and eleventh fractures at the costovertebral junction consistent with healing fractures. Sternum is intact. Exaggerated thoracic kyphosis without acute fracture. Review of the MIP images confirms the above findings. IMPRESSION: 1. Examination is positive for bilateral pulmonary emboli, moderate thromboembolic burden, greatest on the right. There is right heart  strain (RV/LV Ratio = 1.1). This is consistent with at least submassive (intermediate risk) PE. The presence of right heart strain has been associated with an increased risk of morbidity and mortality. Please activate Code PE by paging [phone]. 2. Peripheral ground-glass opacity in the right middle lobe likely pulmonary infarct. 3. Increased left hemothorax from prior exam, small to moderate, with adjacent compressive atelectasis. New small right pleural effusion. 4. Known left rib fractures only partially included in the field of view. Critical Value/emergent results were called by telephone at the time of interpretation on 11/12/2017 at 3:14 am to Dr. Delora Fuel , who verbally acknowledged these results. Aortic Atherosclerosis (ICD10-I70.0). Electronically Signed   By: Keith Rake M.D.   On: 11/12/2017 03:14  Procedures Procedures  CRITICAL CARE Performed by: Delora Fuel Total critical care time: 50 minutes Critical care time was exclusive of separately billable procedures and treating other patients. Critical care was necessary to treat or prevent imminent or life-threatening deterioration. Critical care was time spent personally by me on the following activities: development of treatment plan with patient and/or surrogate as well as nursing, discussions with consultants, evaluation of patient's response to treatment, examination of patient, obtaining history from patient or surrogate, ordering and performing treatments and interventions, ordering and review of laboratory studies, ordering and review of radiographic studies, pulse oximetry and re-evaluation of patient's condition.  Medications Ordered in ED Medications  heparin ADULT infusion 100 units/mL (25000 units/249mL sodium chloride 0.45%) (1,200 Units/hr Intravenous New Bag/Given 11/12/17 0322)  haloperidol lactate (HALDOL) injection 2 mg (2 mg Intramuscular Given 11/12/17 0019)  LORazepam (ATIVAN) injection 0.5 mg (0.5 mg  Intravenous Given 11/12/17 0154)  iopamidol (ISOVUE-370) 76 % injection 80 mL (80 mLs Intravenous Contrast Given 11/12/17 0230)  haloperidol lactate (HALDOL) injection 2 mg (2 mg Intravenous Given 11/12/17 0310)  heparin bolus via infusion 5,000 Units (5,000 Units Intravenous Bolus from Bag 11/12/17 0323)     Initial Impression / Assessment and Plan / ED Course  I have reviewed the triage vital signs and the nursing notes.  Pertinent labs & imaging results that were available during my care of the patient were reviewed by me and considered in my medical decision making (see chart for details).  Acute onset of dyspnea and patient who is currently in a rehab facility.  I reviewed her medication records, and she is not on any DVT prophylaxis.  I am concerned about possibility of pulmonary embolism.  Will screen with d-dimer.  Chest x-ray does show bilateral pleural effusions which is larger on the left.  ECG shows no acute changes.  Mild anemia is present unchanged from baseline.  Mild leukocytosis is noted.  Old records are reviewed, confirming recent hospitalization for chest trauma with rib fractures.  Also, she presents with a MOST form, specifying no CPR, no ICU admission.  D-dimer is elevated.  BNP is normal.  She is sent for CT angiogram of the chest which shows bilateral pulmonary emboli as well as increased pleural effusion on the left which is presumed to be a hemothorax.  She is started on intravenous heparin.  Case is discussed with Dr. Blaine Hamper of [facility], who agrees to admit the patient.  Of note, patient has been having problems with agitation requiring sedation with haloperidol and lorazepam.  Final Clinical Impressions(s) / ED Diagnoses   Final diagnoses:  Acute pulmonary embolism with acute cor pulmonale, unspecified pulmonary embolism type ([place])  Renal insufficiency  Normochromic normocytic anemia    ED Discharge Orders    None      Delora Fuel, MD 93/81/01 [phone]

## 2017-11-12 ENCOUNTER — Encounter (HOSPITAL_COMMUNITY): Payer: Self-pay | Admitting: Radiology

## 2017-11-12 ENCOUNTER — Inpatient Hospital Stay (HOSPITAL_COMMUNITY): Payer: Medicare Other

## 2017-11-12 ENCOUNTER — Emergency Department (HOSPITAL_COMMUNITY): Payer: Medicare Other

## 2017-11-12 ENCOUNTER — Other Ambulatory Visit: Payer: Self-pay

## 2017-11-12 ENCOUNTER — Observation Stay (HOSPITAL_COMMUNITY): Payer: Medicare Other

## 2017-11-12 DIAGNOSIS — I2609 Other pulmonary embolism with acute cor pulmonale: Principal | ICD-10-CM

## 2017-11-12 DIAGNOSIS — M1A9XX0 Chronic gout, unspecified, without tophus (tophi): Secondary | ICD-10-CM | POA: Diagnosis present

## 2017-11-12 DIAGNOSIS — K219 Gastro-esophageal reflux disease without esophagitis: Secondary | ICD-10-CM | POA: Diagnosis present

## 2017-11-12 DIAGNOSIS — N390 Urinary tract infection, site not specified: Secondary | ICD-10-CM | POA: Diagnosis present

## 2017-11-12 DIAGNOSIS — I2694 Multiple subsegmental pulmonary emboli without acute cor pulmonale: Secondary | ICD-10-CM

## 2017-11-12 DIAGNOSIS — I82811 Embolism and thrombosis of superficial veins of right lower extremities: Secondary | ICD-10-CM | POA: Diagnosis present

## 2017-11-12 DIAGNOSIS — D649 Anemia, unspecified: Secondary | ICD-10-CM | POA: Diagnosis present

## 2017-11-12 DIAGNOSIS — G9341 Metabolic encephalopathy: Secondary | ICD-10-CM | POA: Diagnosis not present

## 2017-11-12 DIAGNOSIS — R0602 Shortness of breath: Secondary | ICD-10-CM | POA: Diagnosis not present

## 2017-11-12 DIAGNOSIS — Z7189 Other specified counseling: Secondary | ICD-10-CM | POA: Diagnosis not present

## 2017-11-12 DIAGNOSIS — I2699 Other pulmonary embolism without acute cor pulmonale: Secondary | ICD-10-CM | POA: Diagnosis not present

## 2017-11-12 DIAGNOSIS — I82442 Acute embolism and thrombosis of left tibial vein: Secondary | ICD-10-CM | POA: Diagnosis present

## 2017-11-12 DIAGNOSIS — N184 Chronic kidney disease, stage 4 (severe): Secondary | ICD-10-CM

## 2017-11-12 DIAGNOSIS — M1A3410 Chronic gout due to renal impairment, right hand, without tophus (tophi): Secondary | ICD-10-CM

## 2017-11-12 DIAGNOSIS — R0603 Acute respiratory distress: Secondary | ICD-10-CM | POA: Diagnosis not present

## 2017-11-12 DIAGNOSIS — F0390 Unspecified dementia without behavioral disturbance: Secondary | ICD-10-CM | POA: Diagnosis present

## 2017-11-12 DIAGNOSIS — M810 Age-related osteoporosis without current pathological fracture: Secondary | ICD-10-CM | POA: Diagnosis present

## 2017-11-12 DIAGNOSIS — E041 Nontoxic single thyroid nodule: Secondary | ICD-10-CM | POA: Diagnosis present

## 2017-11-12 DIAGNOSIS — Z515 Encounter for palliative care: Secondary | ICD-10-CM | POA: Diagnosis not present

## 2017-11-12 DIAGNOSIS — S2242XD Multiple fractures of ribs, left side, subsequent encounter for fracture with routine healing: Secondary | ICD-10-CM | POA: Diagnosis not present

## 2017-11-12 DIAGNOSIS — J90 Pleural effusion, not elsewhere classified: Secondary | ICD-10-CM | POA: Diagnosis present

## 2017-11-12 DIAGNOSIS — D72829 Elevated white blood cell count, unspecified: Secondary | ICD-10-CM

## 2017-11-12 DIAGNOSIS — I10 Essential (primary) hypertension: Secondary | ICD-10-CM | POA: Diagnosis not present

## 2017-11-12 DIAGNOSIS — Z66 Do not resuscitate: Secondary | ICD-10-CM | POA: Diagnosis present

## 2017-11-12 DIAGNOSIS — N289 Disorder of kidney and ureter, unspecified: Secondary | ICD-10-CM | POA: Diagnosis present

## 2017-11-12 DIAGNOSIS — I129 Hypertensive chronic kidney disease with stage 1 through stage 4 chronic kidney disease, or unspecified chronic kidney disease: Secondary | ICD-10-CM | POA: Diagnosis present

## 2017-11-12 DIAGNOSIS — W19XXXD Unspecified fall, subsequent encounter: Secondary | ICD-10-CM | POA: Diagnosis present

## 2017-11-12 DIAGNOSIS — S62201D Unspecified fracture of first metacarpal bone, right hand, subsequent encounter for fracture with routine healing: Secondary | ICD-10-CM | POA: Diagnosis not present

## 2017-11-12 DIAGNOSIS — G309 Alzheimer's disease, unspecified: Secondary | ICD-10-CM | POA: Diagnosis not present

## 2017-11-12 DIAGNOSIS — E785 Hyperlipidemia, unspecified: Secondary | ICD-10-CM | POA: Diagnosis present

## 2017-11-12 DIAGNOSIS — Z7982 Long term (current) use of aspirin: Secondary | ICD-10-CM | POA: Diagnosis not present

## 2017-11-12 DIAGNOSIS — N2889 Other specified disorders of kidney and ureter: Secondary | ICD-10-CM | POA: Diagnosis present

## 2017-11-12 LAB — CBC
HEMATOCRIT: 33.1 % — AB (ref 36.0–46.0)
HEMOGLOBIN: 10.2 g/dL — AB (ref 12.0–15.0)
MCH: 26.8 pg (ref 26.0–34.0)
MCHC: 30.8 g/dL (ref 30.0–36.0)
MCV: 87.1 fL (ref 78.0–100.0)
Platelets: 186 10*3/uL (ref 150–400)
RBC: 3.8 MIL/uL — AB (ref 3.87–5.11)
RDW: 14.2 % (ref 11.5–15.5)
WBC: 13.7 10*3/uL — AB (ref 4.0–10.5)

## 2017-11-12 LAB — BASIC METABOLIC PANEL
ANION GAP: 9 (ref 5–15)
BUN: 23 mg/dL (ref 8–23)
CHLORIDE: 110 mmol/L (ref 98–111)
CO2: 18 mmol/L — ABNORMAL LOW (ref 22–32)
Calcium: 9.7 mg/dL (ref 8.9–10.3)
Creatinine, Ser: 1.22 mg/dL — ABNORMAL HIGH (ref 0.44–1.00)
GFR, EST AFRICAN AMERICAN: 43 mL/min — AB (ref >60)
GFR, EST NON AFRICAN AMERICAN: 37 mL/min — AB (ref >60)
Glucose, Bld: 162 mg/dL — ABNORMAL HIGH (ref 70–99)
POTASSIUM: 3.3 mmol/L — AB (ref 3.5–5.1)
SODIUM: 137 mmol/L (ref 135–145)

## 2017-11-12 LAB — HEPARIN LEVEL (UNFRACTIONATED)
HEPARIN UNFRACTIONATED: 0.99 [IU]/mL — AB (ref 0.30–0.70)
Heparin Unfractionated: 0.46 IU/mL (ref 0.30–0.70)

## 2017-11-12 LAB — MRSA PCR SCREENING: MRSA by PCR: NEGATIVE

## 2017-11-12 LAB — D-DIMER, QUANTITATIVE: D-Dimer, Quant: 11.63 ug/mL-FEU — ABNORMAL HIGH (ref 0.00–0.50)

## 2017-11-12 LAB — BRAIN NATRIURETIC PEPTIDE: B Natriuretic Peptide: 43.7 pg/mL (ref 0.0–100.0)

## 2017-11-12 MED ORDER — HALOPERIDOL LACTATE 5 MG/ML IJ SOLN
1.0000 mg | Freq: Four times a day (QID) | INTRAMUSCULAR | Status: DC | PRN
  Administered 2017-11-13 – 2017-11-14 (×3): 2 mg via INTRAVENOUS
  Filled 2017-11-12 (×3): qty 1

## 2017-11-12 MED ORDER — SODIUM CHLORIDE 0.9 % IV SOLN
INTRAVENOUS | Status: DC
  Administered 2017-11-12 (×2): via INTRAVENOUS

## 2017-11-12 MED ORDER — HYDRALAZINE HCL 20 MG/ML IJ SOLN: 5.0000 mg | INTRAMUSCULAR | Status: DC | PRN

## 2017-11-12 MED ORDER — HALOPERIDOL LACTATE 5 MG/ML IJ SOLN
2.0000 mg | Freq: Once | INTRAMUSCULAR | Status: AC
  Administered 2017-11-12: 2 mg via INTRAVENOUS
  Filled 2017-11-12: qty 1

## 2017-11-12 MED ORDER — ACETAMINOPHEN 650 MG RE SUPP: 650.0000 mg | Freq: Four times a day (QID) | RECTAL | Status: DC | PRN

## 2017-11-12 MED ORDER — HEPARIN BOLUS VIA INFUSION
5000.0000 [IU] | Freq: Once | INTRAVENOUS | Status: AC
  Administered 2017-11-12: 5000 [IU] via INTRAVENOUS
  Filled 2017-11-12: qty 5000

## 2017-11-12 MED ORDER — QUETIAPINE FUMARATE 25 MG PO TABS
25.0000 mg | ORAL_TABLET | Freq: Every day | ORAL | Status: DC
  Filled 2017-11-12: qty 1

## 2017-11-12 MED ORDER — MEMANTINE HCL 5 MG PO TABS
5.0000 mg | ORAL_TABLET | Freq: Every day | ORAL | Status: DC
  Filled 2017-11-12 (×2): qty 1

## 2017-11-12 MED ORDER — IOPAMIDOL (ISOVUE-370) INJECTION 76%
INTRAVENOUS | Status: AC
  Filled 2017-11-12: qty 100

## 2017-11-12 MED ORDER — DOCUSATE SODIUM 100 MG PO CAPS
100.0000 mg | ORAL_CAPSULE | Freq: Two times a day (BID) | ORAL | Status: DC
  Filled 2017-11-12 (×2): qty 1

## 2017-11-12 MED ORDER — LEVALBUTEROL HCL 1.25 MG/0.5ML IN NEBU
1.2500 mg | INHALATION_SOLUTION | Freq: Four times a day (QID) | RESPIRATORY_TRACT | Status: DC
  Administered 2017-11-12: 1.25 mg via RESPIRATORY_TRACT
  Filled 2017-11-12: qty 0.5

## 2017-11-12 MED ORDER — MORPHINE SULFATE (PF) 2 MG/ML IV SOLN
0.5000 mg | INTRAVENOUS | Status: DC | PRN
  Administered 2017-11-12 – 2017-11-13 (×3): 0.5 mg via INTRAVENOUS
  Filled 2017-11-12 (×3): qty 1

## 2017-11-12 MED ORDER — ACETAMINOPHEN 325 MG PO TABS: 650.0000 mg | ORAL_TABLET | Freq: Four times a day (QID) | ORAL | Status: DC | PRN

## 2017-11-12 MED ORDER — MELATONIN 3 MG PO TABS
3.0000 mg | ORAL_TABLET | Freq: Every day | ORAL | Status: DC
  Filled 2017-11-12 (×2): qty 1

## 2017-11-12 MED ORDER — HEPARIN (PORCINE) IN NACL 100-0.45 UNIT/ML-% IJ SOLN
1000.0000 [IU]/h | INTRAMUSCULAR | Status: DC
  Administered 2017-11-12: 1200 [IU]/h via INTRAVENOUS
  Administered 2017-11-12: 1000 [IU]/h via INTRAVENOUS
  Filled 2017-11-12 (×2): qty 250

## 2017-11-12 MED ORDER — VITAMIN D 1000 UNITS PO TABS
2000.0000 [IU] | ORAL_TABLET | Freq: Every day | ORAL | Status: DC
  Filled 2017-11-12 (×2): qty 2

## 2017-11-12 MED ORDER — LORAZEPAM 2 MG/ML IJ SOLN
0.5000 mg | Freq: Once | INTRAMUSCULAR | Status: AC
  Administered 2017-11-12: 0.5 mg via INTRAVENOUS
  Filled 2017-11-12: qty 1

## 2017-11-12 MED ORDER — ONDANSETRON HCL 4 MG/2ML IJ SOLN: 4.0000 mg | Freq: Three times a day (TID) | INTRAMUSCULAR | Status: DC | PRN

## 2017-11-12 MED ORDER — ALLOPURINOL 100 MG PO TABS
100.0000 mg | ORAL_TABLET | Freq: Every day | ORAL | Status: DC
  Filled 2017-11-12 (×2): qty 1

## 2017-11-12 MED ORDER — IOPAMIDOL (ISOVUE-370) INJECTION 76%
80.0000 mL | Freq: Once | INTRAVENOUS | Status: AC | PRN
  Administered 2017-11-12: 80 mL via INTRAVENOUS

## 2017-11-12 MED ORDER — HALOPERIDOL LACTATE 5 MG/ML IJ SOLN
2.0000 mg | Freq: Once | INTRAMUSCULAR | Status: AC
  Administered 2017-11-12: 2 mg via INTRAMUSCULAR
  Filled 2017-11-12: qty 1

## 2017-11-12 MED ORDER — LEVALBUTEROL HCL 1.25 MG/0.5ML IN NEBU: 1.2500 mg | INHALATION_SOLUTION | Freq: Four times a day (QID) | RESPIRATORY_TRACT | Status: DC | PRN

## 2017-11-12 NOTE — Progress Notes (Signed)
ANTICOAGULATION CONSULT NOTE - Follow-Up Consult  Pharmacy Consult for heparin Indication: pulmonary embolus  No Known Allergies  Patient Measurements: Ht: 5'3" Wt: 76 kg IBW: 52.4 kg Heparin Dosing Weight: 68 kg  Vital Signs: Temp: 98.4 F (36.9 C) (10/07 1935) Temp Source: Axillary (10/07 1935) BP: 138/59 (10/07 1935) Pulse Rate: 109 (10/07 1935)  Labs: Recent Labs    12/03/2017 2251 11/12/17 0409 11/12/17 1101 11/12/17 2121  HGB 11.2* 10.2*  --   --   HCT 36.6 33.1*  --   --   PLT 221 186  --   --   HEPARINUNFRC  --   --  0.99* 0.46  CREATININE 1.10* 1.22*  --   --     Estimated Creatinine Clearance: 26.4 mL/min (A) (by C-G formula based on SCr of 1.22 mg/dL (H)).   Medications:  Heparin at 1200 units/hr (12 ml/hr)  Assessment: 62 YOF who presented on 10/7 with SOB/CP and was found to have B/L submassive PE with evidence of R-heart strain. No anticoagulation PTA. Pharmacy consulted to start Heparin for anticoagulation.  Heparin level this morning is SUPRAtherapeutic (HL 0.99, goal of 0.3-0.7). Rate reduced and pm heparin level at goal.   Goal of Therapy:  Heparin level 0.3-0.7 units/ml Monitor platelets by anticoagulation protocol: Yes   Plan:  - Continue heparin at 1000 units/hr (10 ml/hr) - Will continue to monitor for any signs/symptoms of bleeding   Thank you for allowing pharmacy to be a part of this patient's care.  Erin Hearing PharmD., BCPS Clinical Pharmacist 11/12/2017 10:28 PM

## 2017-11-12 NOTE — ED Notes (Signed)
Pt wearing a purewick at this time, no urine yet collected

## 2017-11-12 NOTE — Progress Notes (Signed)
ANTICOAGULATION CONSULT NOTE - Initial Consult  Pharmacy Consult for heparin Indication: pulmonary embolus  No Known Allergies  Patient Measurements:   Heparin Dosing Weight: 67 kg  Vital Signs: Temp: 98.2 F (36.8 C) (10/06 2242) Temp Source: Oral (10/06 2242) BP: 118/89 (10/07 0145) Pulse Rate: 111 (10/07 0145)  Labs: Recent Labs    11/16/2017 2251  HGB 11.2*  HCT 36.6  PLT 221  CREATININE 1.10*    CrCl cannot be calculated (Unknown ideal weight.).   Medical History: Past Medical History:  Diagnosis Date  . Calculus of kidney   . Dementia ([place])   . Diverticulosis of colon (without mention of hemorrhage)   . Dizziness and giddiness   . Edema   . Essential hypertension, benign   . Gout   . Hyperlipidemia LDL goal < 100   . Other and unspecified hyperlipidemia   . Other B-complex deficiencies   . Other nonspecific abnormal serum enzyme levels   . Other specified disorder of skin   . Pain in joint, lower leg   . Reflux esophagitis   . Senile osteoporosis   . Unspecified disorder of kidney and ureter     Medications:  See medication history  Assessment: 84 yo lady to start heparin for PE.  She was not on anticoagulation PTA.  Baseline Hg 11.2, PTLC 221K Goal of Therapy:  Heparin level 0.3-0.7 units/ml Monitor platelets by anticoagulation protocol: Yes   Plan:  Heparin 5000 unit bolus and drip at 1200 units/hr Check heparin level 6-8 hours after start Daily HL and CBC while on heparin Monitor for bleeding complications  Mackayla Mullins P[place]et 11/12/2017,3:11 AM

## 2017-11-12 NOTE — Care Management (Signed)
1.  S/W TARYCE B. @ [facility] # [phone]  APIXABAN: NONE FORMULARY ELIQUIS  10 MG BID - NONE FORMULARY  1. ELIQUIS 2.5 MG BID COVER- YES CO-PAY- $ 45.00  Q/L TWO PILL PER DAY TIER- 3 DRUG PRIOR APPROVAL- NO  2. ELIQUIS  5 MG BID COVER- YES CO-PAY- $ 45.00  TWO PILL PER DAY TIER- 3 DRUG PRIOR APPROVAL- NO  RIVAROXABAN: NONE FORMULARY  3. XARELTO  15 MG BID COVER- YES CO-PAY- $ 45.00   Q/L TWO PILL PER DAY TIER- 3 DRUG PRIOR APPROVAL- NO  4. XARELTO 20 MG DAILY COVER- YES CO-PAY- $ 45.00 TIER- 3 DRUG PRIOR APPROVAL- NO  PREFERRED PHARMACY : YES    [facility]

## 2017-11-12 NOTE — ED Notes (Signed)
Patient transported to CT scan . 

## 2017-11-12 NOTE — Progress Notes (Addendum)
TEAM 1 - Stepdown/ICU TEAM  ANNELIESE LEBLOND  EUM:[id number] DOB: Dec 16, 1922 DOA: 11/23/2017 PCP: Gayland Curry, DO    Brief Narrative:  84 y.o. female with a hx of hypertension, hyperlipidemia, dementia, GERD, gout, and CKD 4 who presented with altered mental status, shortness breath, and chest pain. She was hospitalized from 9/17-9/20 after a fall, which lead to a rib fracture and right hand fracture.  She did not require surgery.  She was discharged to a nursing home for rehab. She stays in bed and in a wheelchair most of the time except for her rehabilitation sessions.   In the ED she was noted to have a d-dimer of 11.6 and tachycardia. CT angiogram showed bilateral submassive PE with CT evidence of right heart straining.   Subjective: Pt is seen for a f/u visit.    Assessment & Plan:  Pulmonary emboli heparin drip   Acute metabolic encephalopathy  Recent fall > Left 9-12 rib fractures with small hemothorax + Left flank/back hematoma + R 1st metacarpal fracture and possible 1st proximal phalanx fracture  Essential hypertension  Hyperlipidemia  Chronic kidney disease, stage IV   Chronic gout   Dementia  Left thyroid nodule  Left kidney mass  DVT prophylaxis: IV heparin  Code Status: DNR - NO CODE Family Communication:  Disposition Plan:   Consultants:  none  Antimicrobials:  none   Objective: Blood pressure (!) 157/71, pulse (!) 114, temperature 98 F (36.7 C), temperature source Oral, resp. rate (!) 33, SpO2 97 %.  Intake/Output Summary (Last 24 hours) at 11/12/2017 1209 Last data filed at 11/12/2017 1000 Gross per 24 hour  Intake 597.43 ml  Output -  Net 597.43 ml   There were no vitals filed for this visit.  Examination: Pt was seen for a f/u visit.    CBC: Recent Labs  Lab 11/21/2017 2251 11/12/17 0409  WBC 14.6* 13.7*  NEUTROABS 11.7*  --   HGB 11.2* 10.2*  HCT 36.6 33.1*  MCV 87.8 87.1  PLT 221 540   Basic Metabolic  Panel: Recent Labs  Lab 11/08/2017 2251 11/12/17 0409  NA 139 137  K 3.8 3.3*  CL 110 110  CO2 21* 18*  GLUCOSE 144* 162*  BUN 20 23  CREATININE 1.10* 1.22*  CALCIUM 10.0 9.7   GFR: CrCl cannot be calculated (Unknown ideal weight.).  Liver Function Tests: Recent Labs  Lab 11/12/2017 2251  AST 25  ALT 22  ALKPHOS 90  BILITOT 2.3*  PROT 6.7  ALBUMIN 3.4*    HbA1C: Hgb A1c MFr Bld  Date/Time Value Ref Range Status  03/06/2016 09:59 AM 5.4 <5.7 % Final    Comment:      For the purpose of screening for the presence of diabetes:   <5.7%       Consistent with the absence of diabetes 5.7-6.4 %   Consistent with increased risk for diabetes (prediabetes) >=6.5 %     Consistent with diabetes   This assay result is consistent with a decreased risk of diabetes.   Currently, no consensus exists regarding use of hemoglobin A1c for diagnosis of diabetes in children.   According to American Diabetes Association (ADA) guidelines, hemoglobin A1c <7.0% represents optimal control in non-pregnant diabetic patients. Different metrics may apply to specific patient populations. Standards of Medical Care in Diabetes (ADA).     07/19/2015 10:18 AM 5.4 4.8 - 5.6 % Final    Comment:  Pre-diabetes: 5.7 - 6.4          Diabetes: >6.4          Glycemic control for adults with diabetes: <7.0      Recent Results (from the past 240 hour(s))  Culture, blood (Routine X 2) w Reflex to ID Panel     Status: None (Preliminary result)   Collection Time: 11/12/17  4:00 AM  Result Value Ref Range Status   Specimen Description BLOOD RIGHT ARM  Final   Special Requests   Final    BOTTLES DRAWN AEROBIC AND ANAEROBIC Blood Culture results may not be optimal due to an excessive volume of blood received in culture bottles   Culture   Final    NO GROWTH < 12 HOURS Performed at [facility], [place] [address], [place], [place] [address]    Report Status PENDING  Incomplete  Culture,  blood (Routine X 2) w Reflex to ID Panel     Status: None (Preliminary result)   Collection Time: 11/12/17  4:09 AM  Result Value Ref Range Status   Specimen Description BLOOD RIGHT ARM  Final   Special Requests   Final    BOTTLES DRAWN AEROBIC ONLY Blood Culture adequate volume   Culture   Final    NO GROWTH < 12 HOURS Performed at [facility], [place] [address], [place], [place] [address]    Report Status PENDING  Incomplete  MRSA PCR Screening     Status: None   Collection Time: 11/12/17  9:12 AM  Result Value Ref Range Status   MRSA by PCR NEGATIVE NEGATIVE Final    Comment:        The GeneXpert MRSA Assay (FDA approved for NASAL specimens only), is one component of a comprehensive MRSA colonization surveillance program. It is not intended to diagnose MRSA infection nor to guide or monitor treatment for MRSA infections. Performed at [facility], [place] [address], [place],  [address]      Scheduled Meds: . levalbuterol  1.25 mg Nebulization Q6H   Continuous Infusions: . sodium chloride 75 mL/hr at 11/12/17 0345  . heparin 1,200 Units/hr (11/12/17 0322)     LOS: 0 days   Time spent: No Charge  Cherene Altes, MD [facility] Office  [phone] Pager - Text Page per Shea Evans as per below:  On-Call/Text Page:      [email or web address]  If 7PM-7AM, please contact night-coverage [email or web address] 11/12/2017, 12:09 PM

## 2017-11-12 NOTE — H&P (Signed)
History and Physical    Shannon Berg NWG:[id number] DOB: 08-Nov-1922 DOA: 12/04/2017  Referring MD/NP/PA:   PCP: Gayland Curry, DO   Patient coming from:  The patient is coming from home.  At baseline, pt is independent for most of ADL.    Chief Complaint: AMS, SOB and chest pain  HPI: Shannon Berg is a 84 y.o. female with medical history significant of hypertension, hyperlipidemia, dementia, GERD, gout, CKD 4, who presents with altered mental status, shortness breath, chest pain.  Per patient's daughter, patient was recently hospitalized from 9/17-9/20 after had fall, which lead to rib fracture and right hand bone fracture.  She did not have surgery.  She was discharged to nursing home for rehab. She stays in bed and in a wheelchair most of the time except for her rehabilitation sessions. She has been doing fine until yesterday when she started having dry cough and SOB. She became more confused than her baseline. Per her daughter, at her normal baseline, patient recognize families, is oriented x3.  Currently patient is not oriented x3, very confused and agitated.  She talks about deceased people.  She seems to have chest pain per her daughter. No active nausea, vomiting, diarrhea notated.  She moves all extremities.  She does not have leg edema.  No facial droop noted.  ED Course: pt was found to have positive d-dimer 11.6, WBC 14.6, BNP 43.7, negative troponin, stable renal function, temperature normal, tachycardia, tachypnea, oxygen saturation 93% on RA, chest x-ray showed cardiomegaly, bilateral pleural effusion (left side is worse than the right), CT angiogram showed bilateral submassive PE with CT evidence of right heart straining.  Patient is placed on stepdown bed for observation.  Review of Systems: Could not be reviewed due to altered mental status.  Allergy: No Known Allergies  Past Medical History:  Diagnosis Date  . Calculus of kidney   . Dementia ([place])   .  Diverticulosis of colon (without mention of hemorrhage)   . Dizziness and giddiness   . Edema   . Essential hypertension, benign   . Gout   . Hyperlipidemia LDL goal < 100   . Other and unspecified hyperlipidemia   . Other B-complex deficiencies   . Other nonspecific abnormal serum enzyme levels   . Other specified disorder of skin   . Pain in joint, lower leg   . Reflux esophagitis   . Senile osteoporosis   . Unspecified disorder of kidney and ureter     Past Surgical History:  Procedure Laterality Date  . EXCISION OF ADNEXAL MASS  2007  . FRACTURE SURGERY N/A 1984   wrist  . GANGLION CYST EXCISION  1974    Social History:  reports that she has never smoked. She has never used smokeless tobacco. She reports that she does not drink alcohol or use drugs.  Family History:  Family History  Problem Relation Age of Onset  . Cancer Brother      Prior to Admission medications   Medication Sig Start Date End Date Taking? Authorizing Provider  acetaminophen (TYLENOL) 325 MG tablet Take 2 tablets (650 mg total) by mouth every 4 (four) hours as needed for mild pain. 10/26/17  Yes Rayburn, Floyce Stakes, PA-C  allopurinol (ZYLOPRIM) 100 MG tablet Take 1 tablet (100 mg total) by mouth daily. 08/30/17  Yes Reed, Tiffany L, DO  aspirin EC 81 MG tablet Take 81 mg by mouth daily.    Yes [provider]  Cholecalciferol (VITAMIN D3) 2000  UNITS capsule Take 1 capsule (2,000 Units total) by mouth daily. 12/14/14  Yes Reed, Tiffany L, DO  docusate sodium (COLACE) 100 MG capsule Take 1 capsule (100 mg total) by mouth 2 (two) times daily. 10/26/17  Yes Rayburn, Floyce Stakes, PA-C  meclizine (ANTIVERT) 12.5 MG tablet Take 12.5 mg by mouth 3 (three) times daily as needed for dizziness.   Yes [provider]  Melatonin 5 MG TABS Take 5 mg by mouth at bedtime.   Yes [provider]  memantine (NAMENDA) 5 MG tablet Take 5 mg by mouth daily.   Yes [provider]  nystatin cream  (MYCOSTATIN) Apply 1 application topically 2 (two) times daily.   Yes [provider]  simvastatin (ZOCOR) 10 MG tablet TAKE 1 TABLET ONCE DAILY. Patient taking differently: Take 10 mg by mouth daily.  09/19/17  Yes Reed, Tiffany L, DO  diclofenac sodium (VOLTAREN) 1 % GEL Apply 2 g topically 4 (four) times daily. To affected hands as needed Patient not taking: Reported on 11/20/2017 02/28/16   Hollace Kinnier L, DO  traMADol (ULTRAM) 50 MG tablet Take 0.5 tablets (25 mg total) by mouth every 6 (six) hours as needed for moderate pain or severe pain. Patient not taking: Reported on 11/06/2017 10/26/17   Rayburn, Floyce Stakes, PA-C  TRAVEL SICKNESS 25 MG CHEW TAKE 1/2 TABLET THREE TIMES DAILY AS NEEDED FOR DIZZINESS. Patient not taking: Reported on  05/24/17   Gayland Curry, DO    Physical Exam: Vitals:   11/12/17 0115 11/12/17 0130 11/12/17 0145 11/12/17 0326  BP: (!) 144/82 129/60 118/89 (!) 143/62  Pulse: (!) 130 (!) 112 (!) 111 98  Resp: (!) 23 (!) 27  (!) 22  Temp:      TempSrc:      SpO2: 94% 100% 97% 98%   General: pt is agitated, restless HEENT:       Eyes: PERRL, EOMI, no scleral icterus.       ENT: No discharge from the ears and nose.       Neck: No JVD, no bruit, no mass felt. Heme: No neck lymph node enlargement. Cardiac: S1/S2, RRR, No murmurs, No gallops or rubs. Respiratory: No rales, wheezing, rhonchi or rubs. GI: Soft, nondistended, nontender, no organomegaly, BS present. GU: No hematuria Ext: No pitting leg edema bilaterally. 2+DP/PT pulse bilaterally. Musculoskeletal: No joint deformities, No joint redness or warmth, no limitation of ROM in spin. Skin: No rashes.  Neuro: confused, not oriented X3, cranial nerves II-XII grossly intact, moves all extremities. Psych: Patient is not psychotic, no suicidal or hemocidal ideation.  Labs on Admission: I have personally reviewed following labs and imaging studies  CBC: Recent Labs  Lab 11/24/2017 2251  WBC  14.6*  NEUTROABS 11.7*  HGB 11.2*  HCT 36.6  MCV 87.8  PLT 536   Basic Metabolic Panel: Recent Labs  Lab 11/27/2017 2251  NA 139  K 3.8  CL 110  CO2 21*  GLUCOSE 144*  BUN 20  CREATININE 1.10*  CALCIUM 10.0   GFR: CrCl cannot be calculated (Unknown ideal weight.). Liver Function Tests: Recent Labs  Lab 11/23/2017 2251  AST 25  ALT 22  ALKPHOS 90  BILITOT 2.3*  PROT 6.7  ALBUMIN 3.4*   No results for input(s): LIPASE, AMYLASE in the last 168 hours. No results for input(s): AMMONIA in the last 168 hours. Coagulation Profile: No results for input(s): INR, PROTIME in the last 168 hours. Cardiac Enzymes: No results for input(s): CKTOTAL,  CKMB, CKMBINDEX, TROPONINI in the last 168 hours. BNP (last 3 results) No results for input(s): PROBNP in the last 8760 hours. HbA1C: No results for input(s): HGBA1C in the last 72 hours. CBG: No results for input(s): GLUCAP in the last 168 hours. Lipid Profile: No results for input(s): CHOL, HDL, LDLCALC, TRIG, CHOLHDL, LDLDIRECT in the last 72 hours. Thyroid Function Tests: No results for input(s): TSH, T4TOTAL, FREET4, T3FREE, THYROIDAB in the last 72 hours. Anemia Panel: No results for input(s): VITAMINB12, FOLATE, FERRITIN, TIBC, IRON, RETICCTPCT in the last 72 hours. Urine analysis:    Component Value Date/Time   COLORURINE YELLOW 10/23/2017 1818   APPEARANCEUR CLEAR 10/23/2017 1818   LABSPEC 1.028 10/23/2017 1818   PHURINE 5.0 10/23/2017 1818   GLUCOSEU NEGATIVE 10/23/2017 1818   HGBUR NEGATIVE 10/23/2017 1818   BILIRUBINUR NEGATIVE 10/23/2017 1818   KETONESUR NEGATIVE 10/23/2017 1818   PROTEINUR NEGATIVE 10/23/2017 1818   NITRITE NEGATIVE 10/23/2017 1818   LEUKOCYTESUR MODERATE (A) 10/23/2017 1818   Sepsis Labs: @LABRCNTIP (procalcitonin:4,lacticidven:4) )No results found for this or any previous visit (from the past 240 hour(s)).   Radiological Exams on Admission: Dg Chest 2 View  Result Date:  12/01/2017 CLINICAL DATA:  Shortness of breath with occasional productive cough EXAM: CHEST - 2 VIEW COMPARISON:  10/25/2017 FINDINGS: Mild enlargement of the cardiopericardial silhouette noted. Bilateral pleural effusions, moderate on the left and small on the right. Atherosclerotic calcification of the aortic arch. Mild increase in interstitial accentuation in the lung apices. The patient is rotated to the left on today's radiograph, reducing diagnostic sensitivity and specificity. Bony demineralization. Mild thoracic kyphosis. IMPRESSION: 1. Moderate-sized left and small right pleural effusions, essentially stable. 2. Mild enlargement of the cardiopericardial silhouette. Upper zone pulmonary vascular indistinctness suggesting pulmonary venous hypertension without overt edema. 3.  Atherosclerotic calcification of the aortic arch. 4. Bony demineralization. Electronically Signed   By: Van Clines M.D.   On: 12/01/2017 23:15   Ct Angio Chest Pe W And/or Wo Contrast  Result Date: 11/12/2017 CLINICAL DATA:  84 year old with shortness of breath. Elevated D-dimer. EXAM: CT ANGIOGRAPHY CHEST WITH CONTRAST TECHNIQUE: Multidetector CT imaging of the chest was performed using the standard protocol during bolus administration of intravenous contrast. Multiplanar CT image reconstructions and MIPs were obtained to evaluate the vascular anatomy. CONTRAST:  64 cc ISOVUE-370 IOPAMIDOL (ISOVUE-370) INJECTION 76% COMPARISON:  Chest radiograph yesterday.  Chest CT 10/23/2017 FINDINGS: Cardiovascular: Examination is positive for pulmonary embolus, moderate thromboembolic burden. On the right there are filling defects in the right middle and lower lobar branches, extending into the segmental and subsegmental branches. Segmental involvement of the right upper lobe involving subsegmental branches. On the left filling defects in the segmental upper lobe, and subsegmental left lower lobe. There is dilatation of the right main  pulmonary artery of 3.6 cm, unchanged from prior exam. There is straightening of the intraventricular septum with RV to LV ratio of 1.14. Atherosclerosis of the thoracic aorta without dissection. There are coronary artery calcifications. No pericardial effusion. Mediastinum/Nodes: No enlarged mediastinal or hilar lymph nodes. Left thyroid nodule as before. Trachea and mainstem bronchi are patent. Lungs/Pleura: Slight increase in left hemothorax from prior exam, adjacent compressive atelectasis in the left lower lobe. Small simple right pleural effusion with adjacent atelectasis. Peripheral ground-glass opacity in the right middle lobe suspicious for pulmonary infarct. There is breathing motion artifact limiting assessment. Upper Abdomen: No acute finding. Musculoskeletal: Unknown displaced left lower rib fractures her only partially included in the field of view.  There is also callus formation about nondisplaced tenth and eleventh fractures at the costovertebral junction consistent with healing fractures. Sternum is intact. Exaggerated thoracic kyphosis without acute fracture. Review of the MIP images confirms the above findings. IMPRESSION: 1. Examination is positive for bilateral pulmonary emboli, moderate thromboembolic burden, greatest on the right. There is right heart strain (RV/LV Ratio = 1.1). This is consistent with at least submassive (intermediate risk) PE. The presence of right heart strain has been associated with an increased risk of morbidity and mortality. Please activate Code PE by paging [phone]. 2. Peripheral ground-glass opacity in the right middle lobe likely pulmonary infarct. 3. Increased left hemothorax from prior exam, small to moderate, with adjacent compressive atelectasis. New small right pleural effusion. 4. Known left rib fractures only partially included in the field of view. Critical Value/emergent results were called by telephone at the time of interpretation on 11/12/2017 at  3:14 am to Dr. Delora Fuel , who verbally acknowledged these results. Aortic Atherosclerosis (ICD10-I70.0). Electronically Signed   By: Keith Rake M.D.   On: 11/12/2017 03:14     EKG: Independently reviewed.  Sinus rhythm, QTC 446, low voltage, LAD, anteroseptal infarction pattern.  Assessment/Plan Principal Problem:   Pulmonary emboli (HCC) Active Problems:   Essential hypertension, benign   Hyperlipidemia   Chronic kidney disease, stage IV (severe) (HCC)   Chronic gout of right hand due to renal impairment without tophus   Dementia (HCC)   Leukocytosis   Acute metabolic encephalopathy   Pulmonary emboli [facility]):  CT angiogram showed bilateral submassive PE with CT evidence of right heart straining.  Oxygen saturation is maintained 93% on room air.  Currently hemodynamically stable.  -Place on SUD for obs -heparin drip initiated -2D echocardiogram ordered -LE dopplers ordered to evaluate for DVT -prn xopenex nebs -pain control: IV morphine prn  Acute metabolic encephalopathy: Etiology is not clear, likely due to delirium secondary to pain and PE. -Frequent neuro check -get CT-head when pt calms down -Hold all oral medications and keep patient n.p.o. until mental status improves -check UA  Essential hypertension, benign: -iV hydralazine as needed  Hyperlipidemia: -hold Zocor until mental status improves  Chronic kidney disease, stage IV (severe) ([place]): Stable.  Creatinine 1.0 -Follow-up renal function by BMP  Chronic gout of right hand due to renal impairment without tophus: -Hold allopurinol now  Dementia [facility]): With agitation and altered mental status: Received Haldol 2 mg x 2 , and 0.5 mg of Ativan ED, but still very agitated and restless -Hold Namenda until mental status improves -Give another dose of Ativan 0.5 milligrams -Sitter at the bedside  Leukocytosis: no fever. Likely due to stress induced to demargination. -will follow up blood and UA -follow up  by CBC    DVT ppx: on IV Heparin    Code Status: DNR(I discussed code status with her daughter, and explained the meaning of CODE STATUS. Per her daughter, patient would want to be DNR) Family Communication:   Yes, patient's daughter  at bed side Disposition Plan:  Anticipate discharge back to previous home environment Consults called:  noe Admission status:  SDU/obs    Date of Service 11/12/2017   Ivor Costa [facility] Pager [phone]  If 7PM-7AM, please contact night-coverage [email or web address] Password [email or web address] 11/12/2017, 3:56 AM

## 2017-11-12 NOTE — ED Notes (Signed)
Attempted report 

## 2017-11-12 NOTE — Progress Notes (Signed)
ANTICOAGULATION CONSULT NOTE - Follow-Up Consult  Pharmacy Consult for heparin Indication: pulmonary embolus  No Known Allergies  Patient Measurements: Ht: 5'3" Wt: 76 kg IBW: 52.4 kg Heparin Dosing Weight: 68 kg  Vital Signs: Temp: 98 F (36.7 C) (10/07 0910) Temp Source: Oral (10/07 0910) BP: 157/71 (10/07 0910) Pulse Rate: 114 (10/07 0910)  Labs: Recent Labs    11/08/2017 2251 11/12/17 0409  HGB 11.2* 10.2*  HCT 36.6 33.1*  PLT 221 186  CREATININE 1.10* 1.22*    CrCl cannot be calculated (Unknown ideal weight.).   Medications:  Heparin at 1200 units/hr (12 ml/hr)  Assessment: 40 YOF who presented on 10/7 with SOB/CP and was found to have B/L submassive PE with evidence of R-heart strain. No anticoagulation PTA. Pharmacy consulted to start Heparin for anticoagulation.  Heparin level this morning is SUPRAtherapeutic (HL 0.99, goal of 0.3-0.7). Confirmed drawn from opposite arm as heparin infusion. Hgb/Hct slight drop, plts wnl - no overt signs/symptoms of bleeding noted at this time.   Goal of Therapy:  Heparin level 0.3-0.7 units/ml Monitor platelets by anticoagulation protocol: Yes   Plan:  - Reduce Heparin to 1000 units/hr (10 ml/hr) - Will continue to monitor for any signs/symptoms of bleeding and will follow up with heparin level in 8 hours   Thank you for allowing pharmacy to be a part of this patient's care.  Alycia Rossetti, PharmD, BCPS Clinical Pharmacist Pager: [phone] Clinical phone for 11/12/2017 from 7a-3:30p: [phone] If after 3:30p, please call main pharmacy at: [phone] Please check AMION for all [place] numbers 11/12/2017 9:28 AM

## 2017-11-12 NOTE — Care Management Note (Signed)
Case Management Note  Patient Details  Name: Shannon Berg MRN: [id number] Date of Birth: Mar 01, 1922  Subjective/Objective:  From [facility] was recently at [facility], presents with bil massive PE's, metabolic encephalopathy, htn, ckd, gout of r hand, dementia, leukocytosis. Await benefit check for  Eliquis/xarelto for oral anticoagulation.                  Action/Plan: NCM will follow for transition of care needs.   Expected Discharge Date:                  Expected Discharge Plan:  Assisted Living / Rest Home  In-House Referral:     Discharge planning Services  CM Consult  Post Acute Care Choice:    Choice offered to:     DME Arranged:    DME Agency:     HH Arranged:    HH Agency:     Status of Service:  In process, will continue to follow  If discussed at Long Length of Stay Meetings, dates discussed:    Additional Comments:  Zenon Mayo, RN 11/12/2017, 9:09 AM

## 2017-11-12 NOTE — Progress Notes (Addendum)
VASCULAR LAB PRELIMINARY  PRELIMINARY  PRELIMINARY  PRELIMINARY  Bilateral lower extremity venous duplex completed.    Preliminary report:  There is an acute DVT noted in the left posterior tibial vein.  There is acute SVT noted in the right greater saphenous vein.  Nakiyah Beverley, RVT 11/12/2017, 2:36 PM

## 2017-11-13 DIAGNOSIS — F0281 Dementia in other diseases classified elsewhere with behavioral disturbance: Secondary | ICD-10-CM

## 2017-11-13 DIAGNOSIS — G309 Alzheimer's disease, unspecified: Secondary | ICD-10-CM

## 2017-11-13 LAB — BLOOD CULTURE ID PANEL (REFLEXED)
Acinetobacter baumannii: NOT DETECTED
CANDIDA ALBICANS: NOT DETECTED
CANDIDA PARAPSILOSIS: NOT DETECTED
CANDIDA TROPICALIS: NOT DETECTED
Candida glabrata: NOT DETECTED
Candida krusei: NOT DETECTED
Enterobacter cloacae complex: NOT DETECTED
Enterobacteriaceae species: NOT DETECTED
Enterococcus species: NOT DETECTED
Escherichia coli: NOT DETECTED
HAEMOPHILUS INFLUENZAE: NOT DETECTED
KLEBSIELLA OXYTOCA: NOT DETECTED
KLEBSIELLA PNEUMONIAE: NOT DETECTED
Listeria monocytogenes: NOT DETECTED
METHICILLIN RESISTANCE: NOT DETECTED
NEISSERIA MENINGITIDIS: NOT DETECTED
PROTEUS SPECIES: NOT DETECTED
Pseudomonas aeruginosa: NOT DETECTED
SERRATIA MARCESCENS: NOT DETECTED
STAPHYLOCOCCUS AUREUS BCID: NOT DETECTED
STAPHYLOCOCCUS SPECIES: DETECTED — AB
STREPTOCOCCUS PYOGENES: NOT DETECTED
STREPTOCOCCUS SPECIES: NOT DETECTED
Streptococcus agalactiae: NOT DETECTED
Streptococcus pneumoniae: NOT DETECTED

## 2017-11-13 LAB — COMPREHENSIVE METABOLIC PANEL
ALBUMIN: 2.8 g/dL — AB (ref 3.5–5.0)
ALK PHOS: 81 U/L (ref 38–126)
ALT: 17 U/L (ref 0–44)
ANION GAP: 8 (ref 5–15)
AST: 18 U/L (ref 15–41)
BILIRUBIN TOTAL: 2.1 mg/dL — AB (ref 0.3–1.2)
BUN: 23 mg/dL (ref 8–23)
CALCIUM: 9.4 mg/dL (ref 8.9–10.3)
CO2: 20 mmol/L — AB (ref 22–32)
Chloride: 114 mmol/L — ABNORMAL HIGH (ref 98–111)
Creatinine, Ser: 1.21 mg/dL — ABNORMAL HIGH (ref 0.44–1.00)
GFR calc Af Amer: 43 mL/min — ABNORMAL LOW (ref >60)
GFR calc non Af Amer: 37 mL/min — ABNORMAL LOW (ref >60)
GLUCOSE: 116 mg/dL — AB (ref 70–99)
Potassium: 3.6 mmol/L (ref 3.5–5.1)
SODIUM: 142 mmol/L (ref 135–145)
TOTAL PROTEIN: 5.6 g/dL — AB (ref 6.5–8.1)

## 2017-11-13 LAB — URINALYSIS, ROUTINE W REFLEX MICROSCOPIC
Bilirubin Urine: NEGATIVE
Glucose, UA: NEGATIVE mg/dL
KETONES UR: NEGATIVE mg/dL
NITRITE: NEGATIVE
PH: 5 (ref 5.0–8.0)
Protein, ur: 30 mg/dL — AB

## 2017-11-13 LAB — CBC
HEMATOCRIT: 29.5 % — AB (ref 36.0–46.0)
HEMOGLOBIN: 9.3 g/dL — AB (ref 12.0–15.0)
MCH: 27 pg (ref 26.0–34.0)
MCHC: 31.5 g/dL (ref 30.0–36.0)
MCV: 85.5 fL (ref 80.0–100.0)
Platelets: 172 10*3/uL (ref 150–400)
RBC: 3.45 MIL/uL — AB (ref 3.87–5.11)
RDW: 14.2 % (ref 11.5–15.5)
WBC: 11.3 10*3/uL — ABNORMAL HIGH (ref 4.0–10.5)

## 2017-11-13 LAB — GLUCOSE, CAPILLARY: GLUCOSE-CAPILLARY: 95 mg/dL (ref 70–99)

## 2017-11-13 LAB — HEPARIN LEVEL (UNFRACTIONATED): HEPARIN UNFRACTIONATED: 0.36 [IU]/mL (ref 0.30–0.70)

## 2017-11-13 MED ORDER — APIXABAN 5 MG PO TABS: 5.0000 mg | ORAL_TABLET | Freq: Two times a day (BID) | ORAL | Status: DC

## 2017-11-13 MED ORDER — ATENOLOL 25 MG PO TABS
12.5000 mg | ORAL_TABLET | Freq: Two times a day (BID) | ORAL | Status: DC
  Administered 2017-11-13: 12.5 mg via ORAL
  Filled 2017-11-13: qty 1

## 2017-11-13 MED ORDER — FENTANYL CITRATE (PF) 100 MCG/2ML IJ SOLN
12.5000 ug | INTRAMUSCULAR | Status: DC | PRN
  Administered 2017-11-13: 12.5 ug via INTRAVENOUS
  Filled 2017-11-13: qty 2

## 2017-11-13 MED ORDER — APIXABAN 5 MG PO TABS: 10.0000 mg | ORAL_TABLET | Freq: Two times a day (BID) | ORAL | Status: DC

## 2017-11-13 MED ORDER — ENOXAPARIN SODIUM 80 MG/0.8ML ~~LOC~~ SOLN
75.0000 mg | SUBCUTANEOUS | Status: DC
  Administered 2017-11-13: 75 mg via SUBCUTANEOUS
  Filled 2017-11-13 (×2): qty 0.8

## 2017-11-13 MED ORDER — METOPROLOL TARTRATE 5 MG/5ML IV SOLN
5.0000 mg | Freq: Four times a day (QID) | INTRAVENOUS | Status: DC
  Administered 2017-11-13 – 2017-11-14 (×4): 5 mg via INTRAVENOUS
  Filled 2017-11-13 (×8): qty 5

## 2017-11-13 MED ORDER — APIXABAN 5 MG PO TABS
10.0000 mg | ORAL_TABLET | Freq: Two times a day (BID) | ORAL | Status: DC
  Filled 2017-11-13: qty 2

## 2017-11-13 NOTE — Progress Notes (Signed)
Informed MD about having trouble keeping O2 sats over 88%. Will switch to V mask.

## 2017-11-13 NOTE — Progress Notes (Signed)
Paged MD regarding pt unable to swallow am meds including Eliquis & Atenolol. Still drowsy and only responsive to voice.Unable to follow simple commands.

## 2017-11-13 NOTE — Progress Notes (Addendum)
[facility] TEAM 1 - Stepdown/ICU TEAM  Shannon Berg  YSA:[id number] DOB: 05-22-1922 DOA: 12/02/2017 PCP: Gayland Curry, DO    Brief Narrative:  84 y.o. female with a hx of hypertension, hyperlipidemia, dementia, GERD, gout, and CKD 4 who presented with altered mental status, shortness breath, and chest pain. She was hospitalized from 9/17-9/20 after a fall, which lead to a rib fracture and right hand fracture.  She did not require surgery.  She was discharged to a nursing home for rehab. She stays in bed and in a wheelchair most of the time except for her rehabilitation sessions.   In the ED she was noted to have a d-dimer of 11.6 and tachycardia. CT angiogram showed bilateral submassive PE with CT evidence of right heart straining.   Subjective: Pt is alert but remains confused. She tells me she is at [employer]. She is mildly agitated and can not provide a reliable ROS. She does not appear to be in signif pain, nor is she in acute resp distress.   Assessment & Plan:  Pulmonary emboli heparin drip to be transitioned to DOAC today   Acute metabolic encephalopathy Has not improved - unclear to me what her baseline mental status is - cont nightly seroquel and prn haldol for severe agitation   Abnormal UTI Follow up urine cx - this could be a potential source of confusion - withhold abx for now   Sinus tachycardia Likely combination of PE + agitation -   Recent fall > Left 9-12 rib fractures with small hemothorax + Left flank/back hematoma + R 1st metacarpal fracture and possible 1st proximal phalanx fracture  Essential hypertension Avoid tight overcorrection of BP - follow today w/o change in tx  Chronic kidney disease, stage IV  Follow crt rend - crt has slowly trended up, but appears to have plateaud at this time   Chronic gout  Cont allopurinol - no evidence of acute flair at this time   Dementia Admit hx notes "per her daughter, at her normal baseline, patient recognize  families, is oriented x3" - this is significantly different that present - check for acutely reversible etiologies w/ full metabolic w/u - CT head w/o acute findings   Left thyroid nodule 2.6 cm in size - noted incidentally on CT September 2019 - [place] can be considered for further evaluation in future, though this would not likely help her in a meaningful way  Left kidney mass 2.3cm mass noted incidentally on CT September 2019 - renal protocol CT or MRI could be considered but would not likely help her in a meaningful way  DVT prophylaxis: IV heparin  Code Status: DNR - NO CODE Family Communication: no family present  Disposition Plan: for eventual return to SNF once medically stable   Consultants:  none  Antimicrobials:  none   Objective: Blood pressure (!) 149/64, pulse (!) 109, temperature 97.7 F (36.5 C), temperature source Axillary, resp. rate (!) 28, height 5' (1.524 m), weight 76.8 kg, SpO2 (!) 81 %.  Intake/Output Summary (Last 24 hours) at 11/13/2017 0911 Last data filed at 11/13/2017 0440 Gross per 24 hour  Intake 1705.57 ml  Output 20 ml  Net 1685.57 ml   Filed Weights   11/12/17 0910  Weight: 76.8 kg    Examination: General: No acute respiratory distress - confused  Lungs: CTA B - no wheezing  Cardiovascular: tachycardic - regular - no M or rub  Abdomen: Nontender, nondistended, soft, bowel sounds positive, no rebound, no ascites,  no appreciable mass Extremities: No significant cyanosis, clubbing, or edema bilateral lower extremities   CBC: Recent Labs  Lab 11/14/2017 2251 11/12/17 0409 11/13/17 0225  WBC 14.6* 13.7* 11.3*  NEUTROABS 11.7*  --   --   HGB 11.2* 10.2* 9.3*  HCT 36.6 33.1* 29.5*  MCV 87.8 87.1 85.5  PLT 221 186 270   Basic Metabolic Panel: Recent Labs  Lab 11/16/2017 2251 11/12/17 0409 11/13/17 0225  NA 139 137 142  K 3.8 3.3* 3.6  CL 110 110 114*  CO2 21* 18* 20*  GLUCOSE 144* 162* 116*  BUN 20 23 23   CREATININE 1.10* 1.22* 1.21*   CALCIUM 10.0 9.7 9.4   GFR: Estimated Creatinine Clearance: 26.6 mL/min (A) (by C-G formula based on SCr of 1.21 mg/dL (H)).  Liver Function Tests: Recent Labs  Lab 11/20/2017 2251 11/13/17 0225  AST 25 18  ALT 22 17  ALKPHOS 90 81  BILITOT 2.3* 2.1*  PROT 6.7 5.6*  ALBUMIN 3.4* 2.8*    HbA1C: Hgb A1c MFr Bld  Date/Time Value Ref Range Status  03/06/2016 09:59 AM 5.4 <5.7 % Final    Comment:      For the purpose of screening for the presence of diabetes:   <5.7%       Consistent with the absence of diabetes 5.7-6.4 %   Consistent with increased risk for diabetes (prediabetes) >=6.5 %     Consistent with diabetes   This assay result is consistent with a decreased risk of diabetes.   Currently, no consensus exists regarding use of hemoglobin A1c for diagnosis of diabetes in children.   According to American Diabetes Association (ADA) guidelines, hemoglobin A1c <7.0% represents optimal control in non-pregnant diabetic patients. Different metrics may apply to specific patient populations. Standards of Medical Care in Diabetes (ADA).     07/19/2015 10:18 AM 5.4 4.8 - 5.6 % Final    Comment:             Pre-diabetes: 5.7 - 6.4          Diabetes: >6.4          Glycemic control for adults with diabetes: <7.0      Recent Results (from the past 240 hour(s))  Culture, blood (Routine X 2) w Reflex to ID Panel     Status: None (Preliminary result)   Collection Time: 11/12/17  4:00 AM  Result Value Ref Range Status   Specimen Description BLOOD RIGHT ARM  Final   Special Requests   Final    BOTTLES DRAWN AEROBIC AND ANAEROBIC Blood Culture results may not be optimal due to an excessive volume of blood received in culture bottles   Culture  Setup Time   Final    GRAM POSITIVE COCCI AEROBIC BOTTLE ONLY CRITICAL RESULT CALLED TO, READ BACK BY AND VERIFIED WITH: C AMEND PHARMD 0228 11/13/17 A BROWNING    Culture   Final    GRAM POSITIVE COCCI CULTURE REINCUBATED FOR BETTER  GROWTH Performed at [facility], [place] [address] [place],  [address]    Report Status PENDING  Incomplete  Blood Culture ID Panel (Reflexed)     Status: Abnormal   Collection Time: 11/12/17  4:00 AM  Result Value Ref Range Status   Enterococcus species NOT DETECTED NOT DETECTED Final   Listeria monocytogenes NOT DETECTED NOT DETECTED Final   Staphylococcus species DETECTED (A) NOT DETECTED Final    Comment: Methicillin (oxacillin) susceptible coagulase negative staphylococcus. Possible blood culture contaminant (  unless isolated from more than one blood culture draw or clinical case suggests pathogenicity). No antibiotic treatment is indicated for blood  culture contaminants. CRITICAL RESULT CALLED TO, READ BACK BY AND VERIFIED WITH: C AMEND PHARMD 0228 11/13/17 A BROWNING    Staphylococcus aureus (BCID) NOT DETECTED NOT DETECTED Final   Methicillin resistance NOT DETECTED NOT DETECTED Final   Streptococcus species NOT DETECTED NOT DETECTED Final   Streptococcus agalactiae NOT DETECTED NOT DETECTED Final   Streptococcus pneumoniae NOT DETECTED NOT DETECTED Final   Streptococcus pyogenes NOT DETECTED NOT DETECTED Final   Acinetobacter baumannii NOT DETECTED NOT DETECTED Final   Enterobacteriaceae species NOT DETECTED NOT DETECTED Final   Enterobacter cloacae complex NOT DETECTED NOT DETECTED Final   Escherichia coli NOT DETECTED NOT DETECTED Final   Klebsiella oxytoca NOT DETECTED NOT DETECTED Final   Klebsiella pneumoniae NOT DETECTED NOT DETECTED Final   Proteus species NOT DETECTED NOT DETECTED Final   Serratia marcescens NOT DETECTED NOT DETECTED Final   Haemophilus influenzae NOT DETECTED NOT DETECTED Final   Neisseria meningitidis NOT DETECTED NOT DETECTED Final   Pseudomonas aeruginosa NOT DETECTED NOT DETECTED Final   Candida albicans NOT DETECTED NOT DETECTED Final   Candida glabrata NOT DETECTED NOT DETECTED Final   Candida krusei NOT DETECTED NOT DETECTED Final    Candida parapsilosis NOT DETECTED NOT DETECTED Final   Candida tropicalis NOT DETECTED NOT DETECTED Final    Comment: Performed at [facility], [address] [place], [place] [address]  Culture, blood (Routine X 2) w Reflex to ID Panel     Status: None (Preliminary result)   Collection Time: 11/12/17  4:09 AM  Result Value Ref Range Status   Specimen Description BLOOD RIGHT ARM  Final   Special Requests   Final    BOTTLES DRAWN AEROBIC ONLY Blood Culture adequate volume   Culture   Final    NO GROWTH < 12 HOURS Performed at [facility], [place] [address] [place], [place] [address]    Report Status PENDING  Incomplete  MRSA PCR Screening     Status: None   Collection Time: 11/12/17  9:12 AM  Result Value Ref Range Status   MRSA by PCR NEGATIVE NEGATIVE Final    Comment:        The GeneXpert MRSA Assay (FDA approved for NASAL specimens only), is one component of a comprehensive MRSA colonization surveillance program. It is not intended to diagnose MRSA infection nor to guide or monitor treatment for MRSA infections. Performed at [facility], [place] [address] [place], [place] [address]      Scheduled Meds: . allopurinol  100 mg Oral Daily  . cholecalciferol  2,000 Units Oral Daily  . docusate sodium  100 mg Oral BID  . Melatonin  3 mg Oral QHS  . memantine  5 mg Oral Daily  . QUEtiapine  25 mg Oral QHS   Continuous Infusions: . sodium chloride 50 mL/hr at 11/12/17 2337  . heparin 1,000 Units/hr (11/12/17 2024)     LOS: 1 day   Cherene Altes, MD [facility] Office  [phone] Pager - Text Page per Shea Evans as per below:  On-Call/Text Page:      [email or web address]  If 7PM-7AM, please contact night-coverage [email or web address] 11/13/2017, 9:11 AM

## 2017-11-13 NOTE — Progress Notes (Signed)
PHARMACY - PHYSICIAN COMMUNICATION CRITICAL VALUE ALERT - BLOOD CULTURE IDENTIFICATION (BCID)  Shannon Berg is an 84 y.o. female who presented to [facility] on 11/07/2017 with a chief complaint of PE  Assessment:  Likely contaminant (1/4 blood cultures)  Name of physician (or Provider) Contacted: Chaney Malling, NP  Current antibiotics: None  Changes to prescribed antibiotics recommended:  No antibiotics needed  Results for orders placed or performed during the hospital encounter of 12/03/2017  Blood Culture ID Panel (Reflexed) (Collected: 11/12/2017  4:00 AM)  Result Value Ref Range   Enterococcus species NOT DETECTED NOT DETECTED   Listeria monocytogenes NOT DETECTED NOT DETECTED   Staphylococcus species DETECTED (A) NOT DETECTED   Staphylococcus aureus (BCID) NOT DETECTED NOT DETECTED   Methicillin resistance NOT DETECTED NOT DETECTED   Streptococcus species NOT DETECTED NOT DETECTED   Streptococcus agalactiae NOT DETECTED NOT DETECTED   Streptococcus pneumoniae NOT DETECTED NOT DETECTED   Streptococcus pyogenes NOT DETECTED NOT DETECTED   Acinetobacter baumannii NOT DETECTED NOT DETECTED   Enterobacteriaceae species NOT DETECTED NOT DETECTED   Enterobacter cloacae complex NOT DETECTED NOT DETECTED   Escherichia coli NOT DETECTED NOT DETECTED   Klebsiella oxytoca NOT DETECTED NOT DETECTED   Klebsiella pneumoniae NOT DETECTED NOT DETECTED   Proteus species NOT DETECTED NOT DETECTED   Serratia marcescens NOT DETECTED NOT DETECTED   Haemophilus influenzae NOT DETECTED NOT DETECTED   Neisseria meningitidis NOT DETECTED NOT DETECTED   Pseudomonas aeruginosa NOT DETECTED NOT DETECTED   Candida albicans NOT DETECTED NOT DETECTED   Candida glabrata NOT DETECTED NOT DETECTED   Candida krusei NOT DETECTED NOT DETECTED   Candida parapsilosis NOT DETECTED NOT DETECTED   Candida tropicalis NOT DETECTED NOT DETECTED    Sherlon Handing, PharmD, BCPS Clinical pharmacist  **Pharmacist  phone directory can now be found on [email or web address] ([email or web address]).  Listed under [place]. 11/13/2017  2:35 AM

## 2017-11-13 NOTE — Progress Notes (Signed)
Palliative:  Shannon Berg appears a little more alert (than reported by RN) on venti mask. No family at bedside. She nods head "yes" to pain and attempts to verbalize but voice is garbled and unable to understand. I called Shannon Berg, her daughter and Chauncey Reading, and we will meet tomorrow 11/14/2017 0930 am. Thank you for this consult.   No charge  [name], NP Palliative Medicine Team Pager # [phone] (M-F 8a-5p) Team Phone # [phone] (Nights/Weekends)

## 2017-11-13 NOTE — Progress Notes (Addendum)
ANTICOAGULATION CONSULT NOTE - Initial Consult  Pharmacy Consult for Eliquis Indication: pulmonary embolus  No Known Allergies  Patient Measurements: Height: 5' (152.4 cm) Weight: 169 lb 5 oz (76.8 kg) IBW/kg (Calculated) : 45.5  Vital Signs: Temp: 97.7 F (36.5 C) (10/08 0749) Temp Source: Axillary (10/08 0749) BP: 149/64 (10/08 0749) Pulse Rate: 109 (10/08 0749)  Labs: Recent Labs    11/27/2017 2251 11/12/17 0409 11/12/17 1101 11/12/17 2121 11/13/17 0225  HGB 11.2* 10.2*  --   --  9.3*  HCT 36.6 33.1*  --   --  29.5*  PLT 221 186  --   --  172  HEPARINUNFRC  --   --  0.99* 0.46 0.36  CREATININE 1.10* 1.22*  --   --  1.21*    Estimated Creatinine Clearance: 26.6 mL/min (A) (by C-G formula based on SCr of 1.21 mg/dL (H)).   Medical History: Past Medical History:  Diagnosis Date  . Calculus of kidney   . Dementia ([place])   . Diverticulosis of colon (without mention of hemorrhage)   . Dizziness and giddiness   . Edema   . Essential hypertension, benign   . Gout   . Hyperlipidemia LDL goal < 100   . Other and unspecified hyperlipidemia   . Other B-complex deficiencies   . Other nonspecific abnormal serum enzyme levels   . Other specified disorder of skin   . Pain in joint, lower leg   . Reflux esophagitis   . Senile osteoporosis   . Unspecified disorder of kidney and ureter    Assessment: 58 YOF who presented on 10/7 with SOB/CP and was found to have B/L submassive PE with evidence of R-heart strain. No anticoagulation PTA. Pharmacy consulted to start Heparin for anticoagulation and now to switch to Eliquis. Hgb down to 9.3, plts wnl.  Goal of Therapy:  Monitor platelets by anticoagulation protocol: Yes   Plan:  Stop heparin Start Eliquis 10mg  PO BID x 7 days, then switch to 5mg  PO BID Monitor CBC, s/s of bleed  Emmajane Altamura J 11/13/2017,10:31 AM

## 2017-11-13 NOTE — Discharge Instructions (Signed)
Information on my medicine - ELIQUIS (apixaban)  Why was Eliquis prescribed for you? Eliquis was prescribed to treat blood clots that may have been found in the veins of your legs (deep vein thrombosis) or in your lungs (pulmonary embolism) and to reduce the risk of them occurring again.  What do You need to know about Eliquis ? The starting dose is 10 mg (two 5 mg tablets) taken TWICE daily for the FIRST SEVEN (7) DAYS, then on 10/15  the dose is reduced to ONE 5 mg tablet taken TWICE daily.  Eliquis may be taken with or without food.   Try to take the dose about the same time in the morning and in the evening. If you have difficulty swallowing the tablet whole please discuss with your pharmacist how to take the medication safely.  Take Eliquis exactly as prescribed and DO NOT stop taking Eliquis without talking to the doctor who prescribed the medication.  Stopping may increase your risk of developing a new blood clot.  Refill your prescription before you run out.  After discharge, you should have regular check-up appointments with your healthcare provider that is prescribing your Eliquis.    What do you do if you miss a dose? If a dose of ELIQUIS is not taken at the scheduled time, take it as soon as possible on the same day and twice-daily administration should be resumed. The dose should not be doubled to make up for a missed dose.  Important Safety Information A possible side effect of Eliquis is bleeding. You should call your healthcare provider right away if you experience any of the following: ? Bleeding from an injury or your nose that does not stop. ? Unusual colored urine (red or dark brown) or unusual colored stools (red or black). ? Unusual bruising for unknown reasons. ? A serious fall or if you hit your head (even if there is no bleeding).  Some medicines may interact with Eliquis and might increase your risk of bleeding or clotting while on Eliquis. To help avoid  this, consult your healthcare provider or pharmacist prior to using any new prescription or non-prescription medications, including herbals, vitamins, non-steroidal anti-inflammatory drugs (NSAIDs) and supplements.  This website has more information on Eliquis (apixaban): http://www.eliquis.com/eliquis/home

## 2017-11-13 NOTE — Progress Notes (Signed)
ANTICOAGULATION CONSULT NOTE - Initial Consult  Pharmacy Consult for Lovenox  (pt unable to take Eliquis at this time) Indication: pulmonary embolus  No Known Allergies  Patient Measurements: Height: 5' (152.4 cm) Weight: 169 lb 5 oz (76.8 kg) IBW/kg (Calculated) : 45.5  Vital Signs: Temp: 97.7 F (36.5 C) (10/08 0749) Temp Source: Axillary (10/08 0749) BP: 133/77 (10/08 1101) Pulse Rate: 96 (10/08 1101)  Labs: Recent Labs    12/03/2017 2251 11/12/17 0409 11/12/17 1101 11/12/17 2121 11/13/17 0225  HGB 11.2* 10.2*  --   --  9.3*  HCT 36.6 33.1*  --   --  29.5*  PLT 221 186  --   --  172  HEPARINUNFRC  --   --  0.99* 0.46 0.36  CREATININE 1.10* 1.22*  --   --  1.21*    Estimated Creatinine Clearance: 26.6 mL/min (A) (by C-G formula based on SCr of 1.21 mg/dL (H)).   Medical History: Past Medical History:  Diagnosis Date  . Calculus of kidney   . Dementia ([place])   . Diverticulosis of colon (without mention of hemorrhage)   . Dizziness and giddiness   . Edema   . Essential hypertension, benign   . Gout   . Hyperlipidemia LDL goal < 100   . Other and unspecified hyperlipidemia   . Other B-complex deficiencies   . Other nonspecific abnormal serum enzyme levels   . Other specified disorder of skin   . Pain in joint, lower leg   . Reflux esophagitis   . Senile osteoporosis   . Unspecified disorder of kidney and ureter    Assessment: 36 YOF who presented on 10/7 with SOB/CP and was found to have B/L submassive PE with evidence of R-heart strain. No anticoagulation PTA. Pharmacy initially consulted/ dosing IV heparin which was discontinued this morning with plan to switch to Eliquis. However patient is unable to take PO now>NPO.  Thus MD has consulted pharmacy to start full dose Lovenox.   SCr 1.21 in 84 y.o female, CrCl 26 ml/min , wt ~76kg  Hgb down to 9.3, plts wnl. No bleeding reported.   Goal of Therapy:  Monitor platelets by anticoagulation protocol: Yes    Plan:  Lovenox 75 mg SQ q24h (1mg /kg sq q24h full dose adjusted for CrCl <30 ml/min) give dose now. F/u when okay to switch to Eliquis Monitor CBC, s/s of bleed  Thank you for allowing pharmacy to be part of this patients care team. Nicole Cella, RPh Clinical Pharmacist Please check AMION for all [place] phone numbers After 10:00 PM, call [place] 11/13/2017,11:48 AM

## 2017-11-13 NOTE — Consult Note (Signed)
   [facility] Inpatient Consult   11/13/2017  MAYETTA CASTLEMAN 07-12-1922 [id number]   Patient screened for readmission [facility] in [facility] plan. Spoke with inpatient RNCM regarding needs.  Patient is from [facility] but went to [facility] for rehab. Went by patient's room and currently has a [employer] cabin crew at the bedside.  Patient is a less than 30 day readmission now with multiple pulmonary embolisms. History and Physical review from MD notes as follows: Patient is a  84 y.o.femalewith a hx ofhypertension, hyperlipidemia, dementia, GERD, gout, and CKD 4 who presented with altered mental status, shortness breath, and chest pain. She was hospitalized from 9/17-9/20 after a fall, which lead toa rib fracture and right hand fracture.She did not require surgery. She was discharged [facility] home for rehab. Inpatient RNCM states a apalliative consult has been made. No current [facility] needs noted at this time. Will follow.   Please place a [facility] Care Management consult or for questions contact:   Natividad Brood, RN BSN [facility] Liaison  [phone] business mobile phone Toll free office [phone]

## 2017-11-14 DIAGNOSIS — I2699 Other pulmonary embolism without acute cor pulmonale: Secondary | ICD-10-CM

## 2017-11-14 DIAGNOSIS — Z515 Encounter for palliative care: Secondary | ICD-10-CM

## 2017-11-14 DIAGNOSIS — R0603 Acute respiratory distress: Secondary | ICD-10-CM

## 2017-11-14 DIAGNOSIS — Z7189 Other specified counseling: Secondary | ICD-10-CM

## 2017-11-14 LAB — BASIC METABOLIC PANEL
Anion gap: 6 (ref 5–15)
BUN: 26 mg/dL — AB (ref 8–23)
CO2: 21 mmol/L — ABNORMAL LOW (ref 22–32)
CREATININE: 1.26 mg/dL — AB (ref 0.44–1.00)
Calcium: 9.7 mg/dL (ref 8.9–10.3)
Chloride: 116 mmol/L — ABNORMAL HIGH (ref 98–111)
GFR calc Af Amer: 41 mL/min — ABNORMAL LOW (ref >60)
GFR calc non Af Amer: 36 mL/min — ABNORMAL LOW (ref >60)
Glucose, Bld: 96 mg/dL (ref 70–99)
POTASSIUM: 3.9 mmol/L (ref 3.5–5.1)
SODIUM: 143 mmol/L (ref 135–145)

## 2017-11-14 LAB — CBC
HCT: 28.7 % — ABNORMAL LOW (ref 36.0–46.0)
Hemoglobin: 8.9 g/dL — ABNORMAL LOW (ref 12.0–15.0)
MCH: 26.9 pg (ref 26.0–34.0)
MCHC: 31 g/dL (ref 30.0–36.0)
MCV: 86.7 fL (ref 80.0–100.0)
Platelets: 211 10*3/uL (ref 150–400)
RBC: 3.31 MIL/uL — AB (ref 3.87–5.11)
RDW: 14.4 % (ref 11.5–15.5)
WBC: 11.1 10*3/uL — ABNORMAL HIGH (ref 4.0–10.5)
nRBC: 0 % (ref 0.0–0.2)

## 2017-11-14 LAB — CULTURE, BLOOD (ROUTINE X 2)

## 2017-11-14 LAB — BLOOD GAS, ARTERIAL
Acid-base deficit: 5.6 mmol/L — ABNORMAL HIGH (ref 0.0–2.0)
Bicarbonate: 19.1 mmol/L — ABNORMAL LOW (ref 20.0–28.0)
Drawn by: 441371
O2 Content: 6 L/min
O2 Saturation: 95.8 %
PCO2 ART: 35.7 mmHg (ref 32.0–48.0)
PH ART: 7.347 — AB (ref 7.350–7.450)
Patient temperature: 98.5
pO2, Arterial: 83.4 mmHg (ref 83.0–108.0)

## 2017-11-14 LAB — FOLATE: Folate: 8.8 ng/mL (ref >5.9)

## 2017-11-14 LAB — HEPARIN LEVEL (UNFRACTIONATED): HEPARIN UNFRACTIONATED: 0.16 [IU]/mL — AB (ref 0.30–0.70)

## 2017-11-14 LAB — VITAMIN B12: VITAMIN B 12: 78 pg/mL — AB (ref 180–914)

## 2017-11-14 LAB — RPR: RPR Ser Ql: NONREACTIVE

## 2017-11-14 LAB — AMMONIA: Ammonia: 11 umol/L (ref 9–35)

## 2017-11-14 MED ORDER — GLYCOPYRROLATE 0.2 MG/ML IJ SOLN
0.2000 mg | Freq: Four times a day (QID) | INTRAMUSCULAR | Status: DC
  Administered 2017-11-14 – 2017-11-15 (×3): 0.2 mg via INTRAVENOUS
  Filled 2017-11-14 (×3): qty 1

## 2017-11-14 MED ORDER — POLYVINYL ALCOHOL 1.4 % OP SOLN
1.0000 [drp] | Freq: Four times a day (QID) | OPHTHALMIC | Status: DC | PRN
  Filled 2017-11-14: qty 15

## 2017-11-14 MED ORDER — MORPHINE SULFATE (PF) 2 MG/ML IV SOLN
1.0000 mg | Freq: Four times a day (QID) | INTRAVENOUS | Status: DC
  Administered 2017-11-14: 1 mg via INTRAVENOUS
  Filled 2017-11-14: qty 1

## 2017-11-14 MED ORDER — LORAZEPAM 2 MG/ML IJ SOLN
0.5000 mg | Freq: Four times a day (QID) | INTRAMUSCULAR | Status: DC | PRN
  Administered 2017-11-14: 0.5 mg via INTRAVENOUS
  Filled 2017-11-14: qty 1

## 2017-11-14 MED ORDER — MORPHINE SULFATE (PF) 2 MG/ML IV SOLN: 2.0000 mg | INTRAVENOUS | Status: AC | PRN

## 2017-11-14 MED ORDER — LORAZEPAM 2 MG/ML IJ SOLN: 1.0000 mg | INTRAMUSCULAR | Status: DC | PRN

## 2017-11-14 MED ORDER — MORPHINE 100MG IN NS 100ML (1MG/ML) PREMIX INFUSION
2.0000 mg/h | INTRAVENOUS | Status: DC
  Administered 2017-11-14: 2 mg/h via INTRAVENOUS
  Filled 2017-11-14: qty 100

## 2017-11-14 MED ORDER — MORPHINE SULFATE (PF) 2 MG/ML IV SOLN
1.0000 mg | INTRAVENOUS | Status: DC | PRN
  Administered 2017-11-14 (×2): 1 mg via INTRAVENOUS
  Filled 2017-11-14 (×2): qty 1

## 2017-11-14 MED ORDER — GLYCOPYRROLATE 0.2 MG/ML IJ SOLN
0.2000 mg | INTRAMUSCULAR | Status: DC | PRN
  Administered 2017-11-14: 0.2 mg via INTRAVENOUS
  Filled 2017-11-14: qty 1

## 2017-11-14 MED ORDER — MORPHINE BOLUS VIA INFUSION
2.0000 mg | INTRAVENOUS | Status: DC | PRN
  Administered 2017-11-15: 2 mg via INTRAVENOUS
  Filled 2017-11-14: qty 2

## 2017-11-14 MED ORDER — BIOTENE DRY MOUTH MT LIQD: 15.0000 mL | OROMUCOSAL | Status: DC | PRN

## 2017-11-14 MED ORDER — DEXTROSE-NACL 5-0.45 % IV SOLN: INTRAVENOUS | Status: DC

## 2017-11-14 NOTE — Progress Notes (Signed)
Palliative:  Update: I have spoken with Overton Mam, RN for Ms. Livers. Ms. Smeltz continues to require frequent prn medications without relief of symptoms. Overton Mam has spoken with [name] regarding morphine infusion and she agrees. I will place order for morphine infusion and adjust medications to better ensure comfort.   [name], NP Palliative Medicine Team Pager # [phone] (M-F 8a-5p) Team Phone # [phone] (Nights/Weekends)

## 2017-11-14 NOTE — Consult Note (Signed)
Consultation Note Date: 11/14/2017   Patient Name: Shannon Berg  DOB: [birth year]  MRN: [id number]  Age / Sex: 84 y.o., female  PCP: Gayland Curry, DO Referring Physician: Mercy Riding, MD  Reason for Consultation: Establishing goals of care  HPI/Patient Profile: 84 y.o. female  with past medical history of hypertension, hyperlipidemia, dementia, GERD, gout, CKD stage 4, recent admission s/p fall 9/17-9/20 with rib and right hand fractures and to rehab after hospitalization. Admitted on 11/07/2017 with AMS, SOB, chest pain r/t PE and possible UTI. She has had poor progression during hospitalization and   Clinical Assessment and Goals of Care: I met today at Shannon Berg's bedside. Shannon Berg continues with labored breathing and tachypnea although oxygen saturations are good on nasal cannula currently. Her daughter/HCPOA Shannon Berg and Shannon Berg's son Shannon Berg are at bedside. I spoke privately with Shannon Berg.  Shannon Berg reviewed her mother's previously QOL and that she has been living at [facility] but with increasing difficulty and needing more assistance. Her mother was able to get out and go with Shannon Berg to restaurants and to visit friends and family with rolling walker. However, Shannon Berg has noted significant decline since her mother's fall (she had not previously fallen). We discussed best case scenario from this hospitalization and QOL. We discussed continued current therapies vs transition to full comfort. Shannon Berg agrees that her mother would desire comfort at this time in her life. Shannon Berg was with her mother at [facility] and would stay daily from 9a-10p (with a break for dinner) and had a sitter at night and a friend to be with her mother during her dinner break. Shannon Berg is exhausted but also does not see that her mother will improve from these events to an acceptable QOL. For these reasons she has opted for comfort focused care.   We will liberalize  comfort meds, d/c po meds, d/c tele and anticoagulation with goal to d/c mittens once comfortable. Shannon Berg realizes that this may mean her mother is sleeping more and less interactive. Therapeutic listening and emotional support provided to [place].   Primary Decision Maker HCPOA daughter Shannon Berg (only child)    SUMMARY OF RECOMMENDATIONS   - [facility] death vs hospice facility  Code Status/Advance Care Planning:  DNR   Symptom Management:   Pain/SOB: Morphine IV 1 mg every 6 hours scheduled and every hour prn.   Anxiety: Haldol prn (not very effective per RN). Ativan 0.5 mg IV every 6 hours prn.   Secretions: Robinul IV 0.2 mg every 4 hours prn.   Palliative Prophylaxis:   Aspiration, Frequent Pain Assessment, Oral Care and Turn Reposition  Additional Recommendations (Limitations, Scope, Preferences):  Full Comfort Care  Psycho-social/Spiritual:   Desire for further Chaplaincy support:yes  Additional Recommendations: Education on Hospice and Grief/Bereavement Support  Prognosis:   < 2 weeks, hours to days potentially  Discharge Planning: To Be Determined      Primary Diagnoses: Present on Admission: . Chronic kidney disease, stage IV (severe) ([place]) . Essential hypertension, benign .  Hyperlipidemia . Chronic gout of right hand due to renal impairment without tophus . Dementia ([place]) . Pulmonary emboli ([place]) . Leukocytosis . Acute metabolic encephalopathy . Pulmonary embolism, bilateral ([place])   I have reviewed the medical record, interviewed the patient and family, and examined the patient. The following aspects are pertinent.  Past Medical History:  Diagnosis Date  . Calculus of kidney   . Dementia ([place])   . Diverticulosis of colon (without mention of hemorrhage)   . Dizziness and giddiness   . Edema   . Essential hypertension, benign   . Gout   . Hyperlipidemia LDL goal < 100   . Other and unspecified hyperlipidemia   . Other B-complex  deficiencies   . Other nonspecific abnormal serum enzyme levels   . Other specified disorder of skin   . Pain in joint, lower leg   . Reflux esophagitis   . Senile osteoporosis   . Unspecified disorder of kidney and ureter    Social History   Socioeconomic History  . Marital status: Widowed    Spouse name: Not on file  . Number of children: Not on file  . Years of education: Not on file  . Highest education level: Not on file  Occupational History  . Not on file  Social Needs  . Financial resource strain: Not on file  . Food insecurity:    Worry: Not on file    Inability: Not on file  . Transportation needs:    Medical: Not on file    Non-medical: Not on file  Tobacco Use  . Smoking status: Never Smoker  . Smokeless tobacco: Never Used  Substance and Sexual Activity  . Alcohol use: No    Alcohol/week: 0.0 standard drinks  . Drug use: No  . Sexual activity: Not Currently  Lifestyle  . Physical activity:    Days per week: Not on file    Minutes per session: Not on file  . Stress: Not on file  Relationships  . Social connections:    Talks on phone: Not on file    Gets together: Not on file    Attends religious service: Not on file    Active member of club or organization: Not on file    Attends meetings of clubs or organizations: Not on file    Relationship status: Not on file  Other Topics Concern  . Not on file  Social History Narrative   Widowed 2009   Lives in [place]   Never smoked   Alcohol none   Exercise none   DNR, MOST, POA   Walks with cane               Family History  Problem Relation Age of Onset  . Cancer Brother    Scheduled Meds: . allopurinol  100 mg Oral Daily  . cholecalciferol  2,000 Units Oral Daily  . docusate sodium  100 mg Oral BID  . enoxaparin (LOVENOX) injection  75 mg Subcutaneous Q24H  . Melatonin  3 mg Oral QHS  . memantine  5 mg Oral Daily  . metoprolol tartrate  5 mg Intravenous Q6H  . QUEtiapine  25 mg Oral  QHS   Continuous Infusions: . sodium chloride 10 mL/hr at 11/14/17 8127  . dextrose 5 % and 0.45% NaCl     PRN Meds:.acetaminophen, acetaminophen, fentaNYL (SUBLIMAZE) injection, haloperidol lactate, hydrALAZINE, levalbuterol, ondansetron (ZOFRAN) IV No Known Allergies Review of Systems  Unable to perform ROS: Acuity of condition  Physical Exam  Constitutional: She appears well-developed. She appears lethargic. She appears ill.  Frail, elderly  HENT:  Head: Normocephalic and atraumatic.  Cardiovascular: Normal rate.  Pulmonary/Chest: Accessory muscle usage present. Tachypnea noted. She is in respiratory distress. She has rhonchi.  Abdominal: Soft. Normal appearance.  Neurological: She appears lethargic. She is disoriented.  Nursing note and vitals reviewed.   Vital Signs: BP (!) 122/55   Pulse 68   Temp 98.5 F (36.9 C) (Oral)   Resp (!) 26   Ht 5' (1.524 m)   Wt 76.8 kg   SpO2 96%   BMI 33.07 kg/m  Pain Scale: PAINAD POSS *See Group Information*: S-Acceptable,Sleep, easy to arouse Pain Score: 0-No pain   SpO2: SpO2: 96 % O2 Device:SpO2: 96 % O2 Flow Rate: .O2 Flow Rate (L/min): 8 L/min  IO: Intake/output summary: No intake or output data in the 24 hours ending 11/14/17 0904  LBM: Last BM Date: (UTA) Baseline Weight: Weight: 76.8 kg Most recent weight: Weight: 76.8 kg     Palliative Assessment/Data: 20%     Time In: 0920 Time Out: 1045 Time Total: 75 min Greater than 50%  of this time was spent counseling and coordinating care related to the above assessment and plan.  Signed by: [name], NP Palliative Medicine Team Pager # [phone] (M-F 8a-5p) Team Phone # [phone] (Nights/Weekends)

## 2017-11-14 NOTE — Progress Notes (Signed)
Pt not alert to take PO meds, RN paged MD, MD ordered to hold PO meds. RN will give IV meds that are due.  Riley Kill, RN

## 2017-11-14 NOTE — Progress Notes (Signed)
Palliative:  Full note to follow. Family have decided to transition to full comfort care. Will make transition today to comfort and reassess appropriateness of transition to hospice facility based on how she does with comfort measures. Hospice facility vs hospital death.   [name], NP Palliative Medicine Team Pager # [phone] (M-F 8a-5p) Team Phone # [phone] (Nights/Weekends)

## 2017-11-14 NOTE — Progress Notes (Signed)
TEAM 1 - Stepdown/ICU TEAM  Shannon Berg  YQM:[id number] DOB: January 27, 1923 DOA: 11/13/2017 PCP: Gayland Curry, DO    Brief Narrative:  84 y.o. female with a hx of hypertension, hyperlipidemia, dementia, GERD, gout, and CKD 4 who presented with altered mental status, shortness breath, and chest pain. She was hospitalized from 9/17-9/20 after a fall, which lead to a rib fracture and right hand fracture.  She did not require surgery.  She was discharged to a nursing home for rehab. She stays in bed and in a wheelchair most of the time except for her rehabilitation sessions.   In the ED she was noted to have a d-dimer of 11.6 and tachycardia. CT angiogram showed bilateral submassive PE with CT evidence of right heart straining.  Started on heparin drip.  Subjective: Pt is alert but remains confused. She tells me she is at [facility]. She is mildly agitated and can not provide a reliable ROS. She does not appear to be in signif pain, nor is she in acute resp distress.   Assessment & Plan: Palliative care spoke with patient's family and transition patient to full comfort care. -Discontinue monitoring, labs, frequent vitals and alarms. -May discharge to hospice tomorrow.  Pulmonary emboli Acute metabolic encephalopathy Abnormal UTI Sinus tachycardia Likely combination of PE + agitation  Recent fall > Left 9-12 rib fractures with small hemothorax + Left flank/back hematoma + R 1st metacarpal fracture and possible 1st proximal phalanx fracture Essential hypertension Chronic kidney disease, stage IV   Chronic gout  Dementia Left thyroid nodule 2.6 cm in size - noted incidentally on CT September 2019 Left kidney mass 2.3cm mass noted incidentally on CT September 2019  DVT prophylaxis: None Code Status: DNR - NO CODE Family Communication: no family present  Disposition Plan: Hospice  Consultants:  none  Antimicrobials:  none   Objective: Blood pressure (!) 122/55,  pulse 68, temperature 98.5 F (36.9 C), temperature source Oral, resp. rate (!) 26, height 5' (1.524 m), weight 76.8 kg, SpO2 96 %. No intake or output data in the 24 hours ending 11/14/17 1414 Filed Weights   11/12/17 0910  Weight: 76.8 kg    Examination: General: Sedated and confused.  On oxygen by nasal cannula. Lungs: Increased work of breathing.  On 6 L by nasal cannula.  Coarse air sounds laterally. Cardiovascular: tachycardic - regular - no M or rub  Abdomen: Bowel sounds present.  Nontender nondistended.  CBC: Recent Labs  Lab 11/06/2017 2251 11/12/17 0409 11/13/17 0225 11/14/17 0203  WBC 14.6* 13.7* 11.3* 11.1*  NEUTROABS 11.7*  --   --   --   HGB 11.2* 10.2* 9.3* 8.9*  HCT 36.6 33.1* 29.5* 28.7*  MCV 87.8 87.1 85.5 86.7  PLT 221 186 172 528   Basic Metabolic Panel: Recent Labs  Lab 12/01/2017 2251 11/12/17 0409 11/13/17 0225 11/14/17 0203  NA 139 137 142 143  K 3.8 3.3* 3.6 3.9  CL 110 110 114* 116*  CO2 21* 18* 20* 21*  GLUCOSE 144* 162* 116* 96  BUN 20 23 23  26*  CREATININE 1.10* 1.22* 1.21* 1.26*  CALCIUM 10.0 9.7 9.4 9.7   GFR: Estimated Creatinine Clearance: 25.5 mL/min (A) (by C-G formula based on SCr of 1.26 mg/dL (H)).  Liver Function Tests: Recent Labs  Lab 11/27/2017 2251 11/13/17 0225  AST 25 18  ALT 22 17  ALKPHOS 90 81  BILITOT 2.3* 2.1*  PROT 6.7 5.6*  ALBUMIN 3.4* 2.8*    HbA1C:  Hgb A1c MFr Bld  Date/Time Value Ref Range Status  03/06/2016 09:59 AM 5.4 <5.7 % Final    Comment:      For the purpose of screening for the presence of diabetes:   <5.7%       Consistent with the absence of diabetes 5.7-6.4 %   Consistent with increased risk for diabetes (prediabetes) >=6.5 %     Consistent with diabetes   This assay result is consistent with a decreased risk of diabetes.   Currently, no consensus exists regarding use of hemoglobin A1c for diagnosis of diabetes in children.   According to American Diabetes Association (ADA)  guidelines, hemoglobin A1c <7.0% represents optimal control in non-pregnant diabetic patients. Different metrics may apply to specific patient populations. Standards of Medical Care in Diabetes (ADA).     07/19/2015 10:18 AM 5.4 4.8 - 5.6 % Final    Comment:             Pre-diabetes: 5.7 - 6.4          Diabetes: >6.4          Glycemic control for adults with diabetes: <7.0      Recent Results (from the past 240 hour(s))  Culture, blood (Routine X 2) w Reflex to ID Panel     Status: Abnormal   Collection Time: 11/12/17  4:00 AM  Result Value Ref Range Status   Specimen Description BLOOD RIGHT ARM  Final   Special Requests   Final    BOTTLES DRAWN AEROBIC AND ANAEROBIC Blood Culture results may not be optimal due to an excessive volume of blood received in culture bottles   Culture  Setup Time   Final    GRAM POSITIVE COCCI AEROBIC BOTTLE ONLY CRITICAL RESULT CALLED TO, READ BACK BY AND VERIFIED WITH: C AMEND PHARMD 0228 11/13/17 A BROWNING    Culture (A)  Final    STAPHYLOCOCCUS SPECIES (COAGULASE NEGATIVE) THE SIGNIFICANCE OF ISOLATING THIS ORGANISM FROM A SINGLE SET OF BLOOD CULTURES WHEN MULTIPLE SETS ARE DRAWN IS UNCERTAIN. PLEASE NOTIFY THE MICROBIOLOGY DEPARTMENT WITHIN ONE WEEK IF SPECIATION AND SENSITIVITIES ARE REQUIRED. Performed at [facility], [place] [address] [place], [place] [address]    Report Status 11/14/2017 FINAL  Final  Blood Culture ID Panel (Reflexed)     Status: Abnormal   Collection Time: 11/12/17  4:00 AM  Result Value Ref Range Status   Enterococcus species NOT DETECTED NOT DETECTED Final   Listeria monocytogenes NOT DETECTED NOT DETECTED Final   Staphylococcus species DETECTED (A) NOT DETECTED Final    Comment: Methicillin (oxacillin) susceptible coagulase negative staphylococcus. Possible blood culture contaminant (unless isolated from more than one blood culture draw or clinical case suggests pathogenicity). No antibiotic treatment is indicated  for blood  culture contaminants. CRITICAL RESULT CALLED TO, READ BACK BY AND VERIFIED WITH: C AMEND PHARMD 0228 11/13/17 A BROWNING    Staphylococcus aureus (BCID) NOT DETECTED NOT DETECTED Final   Methicillin resistance NOT DETECTED NOT DETECTED Final   Streptococcus species NOT DETECTED NOT DETECTED Final   Streptococcus agalactiae NOT DETECTED NOT DETECTED Final   Streptococcus pneumoniae NOT DETECTED NOT DETECTED Final   Streptococcus pyogenes NOT DETECTED NOT DETECTED Final   Acinetobacter baumannii NOT DETECTED NOT DETECTED Final   Enterobacteriaceae species NOT DETECTED NOT DETECTED Final   Enterobacter cloacae complex NOT DETECTED NOT DETECTED Final   Escherichia coli NOT DETECTED NOT DETECTED Final   Klebsiella oxytoca NOT DETECTED NOT DETECTED Final  Klebsiella pneumoniae NOT DETECTED NOT DETECTED Final   Proteus species NOT DETECTED NOT DETECTED Final   Serratia marcescens NOT DETECTED NOT DETECTED Final   Haemophilus influenzae NOT DETECTED NOT DETECTED Final   Neisseria meningitidis NOT DETECTED NOT DETECTED Final   Pseudomonas aeruginosa NOT DETECTED NOT DETECTED Final   Candida albicans NOT DETECTED NOT DETECTED Final   Candida glabrata NOT DETECTED NOT DETECTED Final   Candida krusei NOT DETECTED NOT DETECTED Final   Candida parapsilosis NOT DETECTED NOT DETECTED Final   Candida tropicalis NOT DETECTED NOT DETECTED Final    Comment: Performed at [facility], [place] [address], [place], [place] [address]  Culture, blood (Routine X 2) w Reflex to ID Panel     Status: None (Preliminary result)   Collection Time: 11/12/17  4:09 AM  Result Value Ref Range Status   Specimen Description BLOOD RIGHT ARM  Final   Special Requests   Final    BOTTLES DRAWN AEROBIC ONLY Blood Culture adequate volume   Culture   Final    NO GROWTH 2 DAYS Performed at [facility], [place] [address] [place], [place] [address]    Report Status PENDING  Incomplete  MRSA PCR Screening      Status: None   Collection Time: 11/12/17  9:12 AM  Result Value Ref Range Status   MRSA by PCR NEGATIVE NEGATIVE Final    Comment:        The GeneXpert MRSA Assay (FDA approved for NASAL specimens only), is one component of a comprehensive MRSA colonization surveillance program. It is not intended to diagnose MRSA infection nor to guide or monitor treatment for MRSA infections. Performed at [facility], [place] [address], [place], [place] [address]      Scheduled Meds: . metoprolol tartrate  5 mg Intravenous Q6H  .  morphine injection  1 mg Intravenous Q6H   Continuous Infusions: . sodium chloride 10 mL/hr at 11/14/17 0632     LOS: 2 days   [facility] Pager [phone]  If 7PM-7AM, please contact night-coverage [email or web address] Password [email or web address] 11/14/2017, 2:20 PM

## 2017-11-14 NOTE — Progress Notes (Signed)
   11/14/17 1500  Clinical Encounter Type  Visited With Patient not available  Visit Type Initial;Patient actively dying  Referral From Other (Comment) (Palliative NP)   Visited room and patient appeared to be sleeping, spoke to her but continued to appear to be sleeping.  Per Palliative NP, pt's daughter has been here, but was going to step out.  I left note for daughter for when she returns.  Feel free to request chaplain again.  Myra Gianotti resident, [phone]

## 2017-11-17 LAB — CULTURE, BLOOD (ROUTINE X 2)
Culture: NO GROWTH
Special Requests: ADEQUATE

## 2017-11-21 ENCOUNTER — Ambulatory Visit: Payer: Self-pay

## 2017-12-07 NOTE — Progress Notes (Signed)
Palliative Medicine RN Note: Arrived around 0930 for symptom check. Patient's daughter is at the bedside.  Patient is very pale and still, remains hot to the touch; no palpable pulse, but the RN reports that has been the case for hours. Patient's daughter told me, "her heartbeat is very faint." I stayed at the bedside, listening to stories about Shannon Berg.  At [phone], patient began to cool. No heart sounds, no respirations. Confirmed with NP [name]. Dr Posey Pronto is on the floor; he has been notified as well.   Daughter remains sad but appropriate. She repeatedly states that she knows she did the right thing and that this is the death Shannon Berg would want.   Shannon Berg's daughter realized her cell phone is in her car. Valet pulled her car up so I could retrieve the phone, but her husband arrived at the exact same time, so he retrieved the phone. Lucianne Lei will be parked back in the garage by the [place].   [facility] staff offered drinks and snacks, but family declined; they don't plan on staying long, and other family likely won't be coming.   Marjie Skiff Agape Hardiman, RN, BSN, [facility] Palliative Medicine Team 12/02/2017 10:16 AM Office [phone]

## 2017-12-07 NOTE — Progress Notes (Signed)
Patient has been on morphine drip all night no s/s pain or discomfort. Patient has been non responsive with agonal breathing since 0040 O2 sat dropped to the 60's on [place] transitioned to non rebreather B/P dropped this am to 56/35 Full comfort measures given [place] and turned with repositioning. Will continue to monitor.

## 2017-12-07 NOTE — Discharge Summary (Addendum)
[facility] Death Summary   Patient: Shannon Berg WER:[id number]   PCP: Gayland Curry, DO DOB: [birth year]   Date of admission: 11/20/2017   Date and time of death: 11-26-17 @ 9:37AM   Hospital Diagnoses:  Principal Problem:   Pulmonary emboli ([place]) Active Problems:   Essential hypertension, benign   Hyperlipidemia   Chronic kidney disease, stage IV (severe) (HCC)   Chronic gout of right hand due to renal impairment without tophus   Dementia ([place])   Leukocytosis   Acute metabolic encephalopathy   Pulmonary embolism, bilateral (HCC)   Respiratory distress   Goals of care, counseling/discussion   Palliative care encounter  History of present illness: As per the H and P dictated on admission, "Shannon Berg is a 84 y.o. female with medical history significant of hypertension, hyperlipidemia, dementia, GERD, gout, CKD 4, who presents with altered mental status, shortness breath, chest pain.  Per patient's daughter, patient was recently hospitalized from 9/17-9/20 after had fall, which lead to rib fracture and right hand bone fracture.  She did not have surgery.  She was discharged to nursing home for rehab. She stays in bed and in a wheelchair most of the time except for her rehabilitation sessions. She has been doing fine until yesterday when she started having dry cough and SOB. She became more confused than her baseline. Per her daughter, at her normal baseline, patient recognize families, is oriented x3.  Currently patient is not oriented x3, very confused and agitated.  She talks about deceased people.  She seems to have chest pain per her daughter. No active nausea, vomiting, diarrhea notated.  She moves all extremities.  She does not have leg edema.  No facial droop noted."  Hospital Course:  Pulmonary emboli [facility]):   CT angiogram showed bilateral submassive PE with CT evidence of right heart straining.   Acute DVT L posterior tibial vein/Acute superficial vein thrombosis  R saphenous vein Oxygen saturation is maintained 93% on room air initially but later on needed ventimask and then NRB. -heparin drip initiated -pain control: IV morphine prn After worsening respiratory status family decided to transition her care to complete comfort.  Acute metabolic encephalopathy: Etiology is not clear, likely due to delirium secondary to pain and PE. Worsen further and changed to complete comfort.  Essential hypertension, benign:  Hyperlipidemia:  Chronic kidney disease, stage IV (severe) ([place]): Stable.  Creatinine 1.0  Chronic gout of right hand due to renal impairment without tophus:  Dementia ([place]): With agitation and altered mental status:  Leukocytosis:  The patient was pronounced deceased at 9:37 AM, on 26-Nov-2017.  Procedures and Results:  none   Consultations:  Palliative care   The results of significant diagnostics from this hospitalization (including imaging, microbiology, ancillary and laboratory) are listed below for reference.    Significant Diagnostic Studies: Dg Chest 2 View  Result Date: 11/28/2017 CLINICAL DATA:  Shortness of breath with occasional productive cough EXAM: CHEST - 2 VIEW COMPARISON:  10/25/2017 FINDINGS: Mild enlargement of the cardiopericardial silhouette noted. Bilateral pleural effusions, moderate on the left and small on the right. Atherosclerotic calcification of the aortic arch. Mild increase in interstitial accentuation in the lung apices. The patient is rotated to the left on today's radiograph, reducing diagnostic sensitivity and specificity. Bony demineralization. Mild thoracic kyphosis. IMPRESSION: 1. Moderate-sized left and small right pleural effusions, essentially stable. 2. Mild enlargement of the cardiopericardial silhouette. Upper zone pulmonary vascular indistinctness suggesting pulmonary venous hypertension without overt edema. 3.  Atherosclerotic calcification of the aortic arch. 4. Bony  demineralization. Electronically Signed   By: Van Clines M.D.   On: 11/20/2017 23:15   Ct Head Wo Contrast  Result Date: 11/12/2017 CLINICAL DATA:  84 year old female with altered mental status. Status post CTA chest today at 0242 hours demonstrating acute pulmonary emboli. EXAM: CT HEAD WITHOUT CONTRAST TECHNIQUE: Contiguous axial images were obtained from the base of the skull through the vertex without intravenous contrast. COMPARISON:  Head CT and cervical spine CT without contrast 10/23/2017. FINDINGS: Brain: Stable cerebral volume, normal for age. No midline shift, ventriculomegaly, mass effect, evidence of mass lesion, intracranial hemorrhage or evidence of cortically based acute infarction. Gray-white matter differentiation is within normal limits for age throughout the brain. Vascular: Calcified atherosclerosis at the skull base. There is some residual intravascular contrast present from the earlier CTA. Skull: Hyperostosis.  No acute osseous abnormality identified. Sinuses/Orbits: Visualized paranasal sinuses and mastoids are stable and well pneumatized. Other: No acute orbit or scalp soft tissue findings. IMPRESSION: Stable and negative for age CT appearance of the brain; some intravascular contrast remains from the earlier CTA Chest. Electronically Signed   By: Genevie Ann M.D.   On: 11/12/2017 08:32   Ct Head Wo Contrast  Result Date: 10/23/2017 CLINICAL DATA:  Patient fell yesterday.  C-spine trauma. EXAM: CT HEAD WITHOUT CONTRAST CT CERVICAL SPINE WITHOUT CONTRAST TECHNIQUE: Multidetector CT imaging of the head and cervical spine was performed following the standard protocol without intravenous contrast. Multiplanar CT image reconstructions of the cervical spine were also generated. COMPARISON:  None. FINDINGS: CT HEAD FINDINGS Brain: There is no evidence for acute hemorrhage, hydrocephalus, mass lesion, or abnormal extra-axial fluid collection. No definite CT evidence for acute  infarction. Diffuse loss of parenchymal volume is consistent with atrophy. Patchy low attenuation in the deep hemispheric and periventricular white matter is nonspecific, but likely reflects chronic microvascular ischemic demyelination. Vascular: No hyperdense vessel or unexpected calcification. Skull: No evidence for fracture. No worrisome lytic or sclerotic lesion. Sinuses/Orbits: The visualized paranasal sinuses and mastoid air cells are clear. Visualized portions of the globes and intraorbital fat are unremarkable. Other: None. CT CERVICAL SPINE FINDINGS Alignment: Mild accentuation of normal cervical lordosis. Skull base and vertebrae: No evidence for an acute fracture. Small sclerotic focus in the inferior C6 vertebral body is nonspecific. Loss of intervertebral disc height noted at C5-6 and C6-7. Bilateral facet osteoarthritis is worse on the left than the right. Soft tissues and spinal canal: No prevertebral fluid or swelling. No visible canal hematoma. 2.9 cm left thyroid nodule evident. This shows slight interval progression since CT scan of 11/15/2015. Disc levels:  Mild loss of disc height at C5-6 and C6-7. Upper chest: Scarring noted left lung apex. Other: None. IMPRESSION: 1. No acute intracranial abnormality. Atrophy with chronic small vessel white matter ischemic disease. 2. No cervical spine fracture with degenerative changes noted mid levels. 3. 2.9 cm left thyroid nodule, progressive since 11/15/2015. Thyroid ultrasound could be used to further evaluate, as clinically warranted. Electronically Signed   By: Misty Stanley M.D.   On: 10/23/2017 15:07   Ct Chest W Contrast  Result Date: 10/23/2017 CLINICAL DATA:  [place] yesterday. LEFT hip and LEFT lower quadrant pain. History of hypertension, hyperlipidemia, adnexal mass resection. EXAM: CT CHEST, ABDOMEN, AND PELVIS WITH CONTRAST TECHNIQUE: Multidetector CT imaging of the chest, abdomen and pelvis was performed following the standard protocol  during bolus administration of intravenous contrast. CONTRAST:  9mL ISOVUE-300 IOPAMIDOL (ISOVUE-300) INJECTION 61%  COMPARISON:  CT chest November 15, 2015 and CT abdomen and pelvis August 03, 2005 FINDINGS: CT CHEST FINDINGS CARDIOVASCULAR: Heart size is mildly enlarged. Mild coronary artery calcifications. No pericardial effusions. Thoracic aorta is normal course and caliber, mild calcific atherosclerosis. Nonspecific enlargement RIGHT main pulmonary artery. MEDIASTINUM/NODES: No mediastinal mass. No lymphadenopathy by CT size criteria. Normal appearance of thoracic esophagus though not tailored for evaluation. LUNGS/PLEURA: Tracheobronchial tree is patent, no pneumothorax. Increasing patchy LEFT lower lobe airspace opacity. Small dense LEFT pleural effusion. Stable LEFT apical scarring. RIGHT lung base atelectasis associated with small fat containing diaphragmatic hernia. MUSCULOSKELETAL: Displaced LEFT ninth through twelfth rib fractures. Enlarged edematous LEFT latissimus dorsi muscle with fat stranding. Osteopenia. RIGHT shoulder bursal collection with loose bodies. Diffusely hypodense 2.6 cm LEFT thyroid nodule. Subcentimeter RIGHT thyroid nodule. CT ABDOMEN AND PELVIS FINDINGS HEPATOBILIARY: Liver and gallbladder are normal. PANCREAS: Normal. Mild focal fatty infiltration about the falciform ligament. SPLEEN: Nonacute.  Borderline splenomegaly. ADRENALS/URINARY TRACT: Kidneys are orthotopic, demonstrating symmetric enhancement. Atrophic LEFT kidney. Bilateral nephrolithiasis measuring to 15 mm on the LEFT. 2.3 cm exophytic mass new from prior CT (64 Hounsfield units on initial phase, 50 Hounsfield units on delayed phase). The unopacified ureters are normal in course and caliber. Delayed imaging through the kidneys demonstrates symmetric prompt contrast excretion within the proximal urinary collecting system. Urinary bladder is partially distended, mild prolapse. Normal adrenal glands. STOMACH/BOWEL: Small  hiatal hernia. Small duodenal diverticulum. The stomach, small and large bowel are normal in course and caliber without inflammatory changes. Severe sigmoid colonic diverticulosis. Normal appendix. VASCULAR/LYMPHATIC: Aortoiliac vessels are normal in course and caliber. Moderate calcific atherosclerosis. No lymphadenopathy by CT size criteria. REPRODUCTIVE: Normal. OTHER: No intraperitoneal free fluid or free air. Small fat containing umbilical hernia. MUSCULOSKELETAL: Non-acute. Osteopenia. Mild lumbar levoscoliosis. Grade 1 L5-S1 anterolisthesis without spondylolysis. IMPRESSION: CT CHEST: 1. Displaced LEFT ninth through twelfth rib fractures. LEFT chest wall and LEFT latissimus dorsi hematoma. 2. Small LEFT hemothorax. LEFT lower lobe atelectasis versus contusion. 3. **An incidental finding of potential clinical significance has been found. 2.6 cm LEFT thyroid nodule. Recommend NONEMERGENT thyroid ultrasound. This follows ACR consensus guidelines: Managing Incidental Thyroid Nodules Detected on Imaging: White Paper of the ACR Incidental Thyroid Findings Committee. J Am Coll Radiol 2015; 12:143-150.** CT ABDOMEN AND PELVIS: 1. No CT findings of acute trauma. No acute intra-abdominopelvic process. 2. Nonobstructing bilateral nephrolithiasis. 3. **An incidental finding of potential clinical significance has been found. New 2.3 cm LEFT renal indeterminate mass. Recommend non emergent renal protocol CT or MRI with and without contrast.** Aortic Atherosclerosis (ICD10-I70.0). Electronically Signed   By: Elon Alas M.D.   On: 10/23/2017 15:18   Ct Angio Chest Pe W And/or Wo Contrast  Result Date: 11/12/2017 CLINICAL DATA:  84 year old with shortness of breath. Elevated D-dimer. EXAM: CT ANGIOGRAPHY CHEST WITH CONTRAST TECHNIQUE: Multidetector CT imaging of the chest was performed using the standard protocol during bolus administration of intravenous contrast. Multiplanar CT image reconstructions and MIPs were  obtained to evaluate the vascular anatomy. CONTRAST:  64 cc ISOVUE-370 IOPAMIDOL (ISOVUE-370) INJECTION 76% COMPARISON:  Chest radiograph yesterday.  Chest CT 10/23/2017 FINDINGS: Cardiovascular: Examination is positive for pulmonary embolus, moderate thromboembolic burden. On the right there are filling defects in the right middle and lower lobar branches, extending into the segmental and subsegmental branches. Segmental involvement of the right upper lobe involving subsegmental branches. On the left filling defects in the segmental upper lobe, and subsegmental left lower lobe. There is dilatation of the right  main pulmonary artery of 3.6 cm, unchanged from prior exam. There is straightening of the intraventricular septum with RV to LV ratio of 1.14. Atherosclerosis of the thoracic aorta without dissection. There are coronary artery calcifications. No pericardial effusion. Mediastinum/Nodes: No enlarged mediastinal or hilar lymph nodes. Left thyroid nodule as before. Trachea and mainstem bronchi are patent. Lungs/Pleura: Slight increase in left hemothorax from prior exam, adjacent compressive atelectasis in the left lower lobe. Small simple right pleural effusion with adjacent atelectasis. Peripheral ground-glass opacity in the right middle lobe suspicious for pulmonary infarct. There is breathing motion artifact limiting assessment. Upper Abdomen: No acute finding. Musculoskeletal: Unknown displaced left lower rib fractures her only partially included in the field of view. There is also callus formation about nondisplaced tenth and eleventh fractures at the costovertebral junction consistent with healing fractures. Sternum is intact. Exaggerated thoracic kyphosis without acute fracture. Review of the MIP images confirms the above findings. IMPRESSION: 1. Examination is positive for bilateral pulmonary emboli, moderate thromboembolic burden, greatest on the right. There is right heart strain (RV/LV Ratio = 1.1).  This is consistent with at least submassive (intermediate risk) PE. The presence of right heart strain has been associated with an increased risk of morbidity and mortality. Please activate Code PE by paging [phone]. 2. Peripheral ground-glass opacity in the right middle lobe likely pulmonary infarct. 3. Increased left hemothorax from prior exam, small to moderate, with adjacent compressive atelectasis. New small right pleural effusion. 4. Known left rib fractures only partially included in the field of view. Critical Value/emergent results were called by telephone at the time of interpretation on 11/12/2017 at 3:14 am to Dr. Delora Fuel , who verbally acknowledged these results. Aortic Atherosclerosis (ICD10-I70.0). Electronically Signed   By: Keith Rake M.D.   On: 11/12/2017 03:14   Ct Cervical Spine Wo Contrast  Result Date: 10/23/2017 CLINICAL DATA:  Patient fell yesterday.  C-spine trauma. EXAM: CT HEAD WITHOUT CONTRAST CT CERVICAL SPINE WITHOUT CONTRAST TECHNIQUE: Multidetector CT imaging of the head and cervical spine was performed following the standard protocol without intravenous contrast. Multiplanar CT image reconstructions of the cervical spine were also generated. COMPARISON:  None. FINDINGS: CT HEAD FINDINGS Brain: There is no evidence for acute hemorrhage, hydrocephalus, mass lesion, or abnormal extra-axial fluid collection. No definite CT evidence for acute infarction. Diffuse loss of parenchymal volume is consistent with atrophy. Patchy low attenuation in the deep hemispheric and periventricular white matter is nonspecific, but likely reflects chronic microvascular ischemic demyelination. Vascular: No hyperdense vessel or unexpected calcification. Skull: No evidence for fracture. No worrisome lytic or sclerotic lesion. Sinuses/Orbits: The visualized paranasal sinuses and mastoid air cells are clear. Visualized portions of the globes and intraorbital fat are unremarkable. Other: None.  CT CERVICAL SPINE FINDINGS Alignment: Mild accentuation of normal cervical lordosis. Skull base and vertebrae: No evidence for an acute fracture. Small sclerotic focus in the inferior C6 vertebral body is nonspecific. Loss of intervertebral disc height noted at C5-6 and C6-7. Bilateral facet osteoarthritis is worse on the left than the right. Soft tissues and spinal canal: No prevertebral fluid or swelling. No visible canal hematoma. 2.9 cm left thyroid nodule evident. This shows slight interval progression since CT scan of 11/15/2015. Disc levels:  Mild loss of disc height at C5-6 and C6-7. Upper chest: Scarring noted left lung apex. Other: None. IMPRESSION: 1. No acute intracranial abnormality. Atrophy with chronic small vessel white matter ischemic disease. 2. No cervical spine fracture with degenerative changes noted mid levels. 3.  2.9 cm left thyroid nodule, progressive since 11/15/2015. Thyroid ultrasound could be used to further evaluate, as clinically warranted. Electronically Signed   By: Misty Stanley M.D.   On: 10/23/2017 15:07   Ct Abdomen Pelvis W Contrast  Result Date: 10/23/2017 CLINICAL DATA:  [place] yesterday. LEFT hip and LEFT lower quadrant pain. History of hypertension, hyperlipidemia, adnexal mass resection. EXAM: CT CHEST, ABDOMEN, AND PELVIS WITH CONTRAST TECHNIQUE: Multidetector CT imaging of the chest, abdomen and pelvis was performed following the standard protocol during bolus administration of intravenous contrast. CONTRAST:  9mL ISOVUE-300 IOPAMIDOL (ISOVUE-300) INJECTION 61% COMPARISON:  CT chest November 15, 2015 and CT abdomen and pelvis August 03, 2005 FINDINGS: CT CHEST FINDINGS CARDIOVASCULAR: Heart size is mildly enlarged. Mild coronary artery calcifications. No pericardial effusions. Thoracic aorta is normal course and caliber, mild calcific atherosclerosis. Nonspecific enlargement RIGHT main pulmonary artery. MEDIASTINUM/NODES: No mediastinal mass. No lymphadenopathy by CT size  criteria. Normal appearance of thoracic esophagus though not tailored for evaluation. LUNGS/PLEURA: Tracheobronchial tree is patent, no pneumothorax. Increasing patchy LEFT lower lobe airspace opacity. Small dense LEFT pleural effusion. Stable LEFT apical scarring. RIGHT lung base atelectasis associated with small fat containing diaphragmatic hernia. MUSCULOSKELETAL: Displaced LEFT ninth through twelfth rib fractures. Enlarged edematous LEFT latissimus dorsi muscle with fat stranding. Osteopenia. RIGHT shoulder bursal collection with loose bodies. Diffusely hypodense 2.6 cm LEFT thyroid nodule. Subcentimeter RIGHT thyroid nodule. CT ABDOMEN AND PELVIS FINDINGS HEPATOBILIARY: Liver and gallbladder are normal. PANCREAS: Normal. Mild focal fatty infiltration about the falciform ligament. SPLEEN: Nonacute.  Borderline splenomegaly. ADRENALS/URINARY TRACT: Kidneys are orthotopic, demonstrating symmetric enhancement. Atrophic LEFT kidney. Bilateral nephrolithiasis measuring to 15 mm on the LEFT. 2.3 cm exophytic mass new from prior CT (64 Hounsfield units on initial phase, 50 Hounsfield units on delayed phase). The unopacified ureters are normal in course and caliber. Delayed imaging through the kidneys demonstrates symmetric prompt contrast excretion within the proximal urinary collecting system. Urinary bladder is partially distended, mild prolapse. Normal adrenal glands. STOMACH/BOWEL: Small hiatal hernia. Small duodenal diverticulum. The stomach, small and large bowel are normal in course and caliber without inflammatory changes. Severe sigmoid colonic diverticulosis. Normal appendix. VASCULAR/LYMPHATIC: Aortoiliac vessels are normal in course and caliber. Moderate calcific atherosclerosis. No lymphadenopathy by CT size criteria. REPRODUCTIVE: Normal. OTHER: No intraperitoneal free fluid or free air. Small fat containing umbilical hernia. MUSCULOSKELETAL: Non-acute. Osteopenia. Mild lumbar levoscoliosis. Grade 1 L5-S1  anterolisthesis without spondylolysis. IMPRESSION: CT CHEST: 1. Displaced LEFT ninth through twelfth rib fractures. LEFT chest wall and LEFT latissimus dorsi hematoma. 2. Small LEFT hemothorax. LEFT lower lobe atelectasis versus contusion. 3. **An incidental finding of potential clinical significance has been found. 2.6 cm LEFT thyroid nodule. Recommend NONEMERGENT thyroid ultrasound. This follows ACR consensus guidelines: Managing Incidental Thyroid Nodules Detected on Imaging: White Paper of the ACR Incidental Thyroid Findings Committee. J Am Coll Radiol 2015; 12:143-150.** CT ABDOMEN AND PELVIS: 1. No CT findings of acute trauma. No acute intra-abdominopelvic process. 2. Nonobstructing bilateral nephrolithiasis. 3. **An incidental finding of potential clinical significance has been found. New 2.3 cm LEFT renal indeterminate mass. Recommend non emergent renal protocol CT or MRI with and without contrast.** Aortic Atherosclerosis (ICD10-I70.0). Electronically Signed   By: Elon Alas M.D.   On: 10/23/2017 15:18   Dg Chest Port 1 View  Result Date: 10/25/2017 CLINICAL DATA:  Short of breath, effusions, follow-up EXAM: PORTABLE CHEST 1 VIEW COMPARISON:  Portable chest x-ray of 10/24/2017 and 10/23/2017, recent fall with left rib fractures FINDINGS: Lungs are relatively  well aerated. Basilar opacities consistent with atelectasis and effusions remain left-greater-than-right. Pneumonia at the left lung base would be difficult to exclude. The heart is mildly enlarged and stable. There are left rib fractures again noted. IMPRESSION: 1. Persistent bibasilar opacities most consistent with atelectasis and effusion. Left basilar pneumonia cannot be excluded. 2. Stable mild cardiomegaly. Electronically Signed   By: Ivar Drape M.D.   On: 10/25/2017 10:24   Dg Chest Port 1 View  Result Date: 10/24/2017 CLINICAL DATA:  Follow-up hemothorax EXAM: PORTABLE CHEST 1 VIEW COMPARISON:  10/23/2017 FINDINGS: Cardiac shadow  is enlarged but accentuated by the portable technique. Aortic calcifications are again noted. The lungs are well aerated bilaterally. Small effusions left greater than right are noted. The known left rib fractures are incompletely evaluated on this exam. IMPRESSION: Bilateral effusions left greater than right. Electronically Signed   By: Inez Catalina M.D.   On: 10/24/2017 10:15   Dg Chest Port 1 View  Result Date: 10/23/2017 CLINICAL DATA:  Confusion, fell yesterday. EXAM: PORTABLE CHEST 1 VIEW COMPARISON:  CT scan of the chest of November 15, 2015. FINDINGS: The lungs remain hyperinflated. There is a small left pleural effusion. There is no alveolar infiltrate. The heart and pulmonary vascularity are normal. There is tortuosity of the ascending and descending thoracic aorta. There is mural plaque in the thoracic aorta. The bones are subjectively osteopenic. IMPRESSION: COPD.  Small left pleural effusion.  No pneumonia nor CHF. Thoracic aortic atherosclerosis. Electronically Signed   By: David  Martinique M.D.   On: 10/23/2017 13:50   Dg Hand Complete Right  Result Date: 10/23/2017 CLINICAL DATA:  Unwitnessed fall with bruising to the thumb and index fingers EXAM: RIGHT HAND - COMPLETE 3+ VIEW COMPARISON:  04/08/2015 FINDINGS: Bones appear osteopenic. Possible small avulsion fracture injury at the base of the first proximal phalanx. Irregularity and lucency at sesamoid bone adjacent to head of first metacarpal. No subluxation. Moderate arthritis at the first [facility] joint. Prominent arthritis at the DIP and PIP joints with joint space narrowing, small erosions and osteophytes. IMPRESSION: Suspected fracture through sesamoid bone adjacent to the head of the first metacarpal. Possible small adjacent basilar avulsion involving the first proximal phalanx. Digital and first [facility] joint arthritis. Electronically Signed   By: Donavan Foil M.D.   On: 10/23/2017 17:05    Microbiology: Recent Results (from the past 240  hour(s))  Culture, blood (Routine X 2) w Reflex to ID Panel     Status: Abnormal   Collection Time: 11/12/17  4:00 AM  Result Value Ref Range Status   Specimen Description BLOOD RIGHT ARM  Final   Special Requests   Final    BOTTLES DRAWN AEROBIC AND ANAEROBIC Blood Culture results may not be optimal due to an excessive volume of blood received in culture bottles   Culture  Setup Time   Final    GRAM POSITIVE COCCI AEROBIC BOTTLE ONLY CRITICAL RESULT CALLED TO, READ BACK BY AND VERIFIED WITH: C AMEND PHARMD [id number] 11/13/17 A BROWNING    Culture (A)  Final    STAPHYLOCOCCUS SPECIES (COAGULASE NEGATIVE) THE SIGNIFICANCE OF ISOLATING THIS ORGANISM FROM A SINGLE SET OF BLOOD CULTURES WHEN MULTIPLE SETS ARE DRAWN IS UNCERTAIN. PLEASE NOTIFY THE MICROBIOLOGY DEPARTMENT WITHIN ONE WEEK IF SPECIATION AND SENSITIVITIES ARE REQUIRED. Performed at [facility], [place] [address] [place], [place] [address]    Report Status 11/14/2017 FINAL  Final  Blood Culture ID Panel (Reflexed)     Status: Abnormal  Collection Time: 11/12/17  4:00 AM  Result Value Ref Range Status   Enterococcus species NOT DETECTED NOT DETECTED Final   Listeria monocytogenes NOT DETECTED NOT DETECTED Final   Staphylococcus species DETECTED (A) NOT DETECTED Final    Comment: Methicillin (oxacillin) susceptible coagulase negative staphylococcus. Possible blood culture contaminant (unless isolated from more than one blood culture draw or clinical case suggests pathogenicity). No antibiotic treatment is indicated for blood  culture contaminants. CRITICAL RESULT CALLED TO, READ BACK BY AND VERIFIED WITH: C AMEND PHARMD 0228 11/13/17 A BROWNING    Staphylococcus aureus (BCID) NOT DETECTED NOT DETECTED Final   Methicillin resistance NOT DETECTED NOT DETECTED Final   Streptococcus species NOT DETECTED NOT DETECTED Final   Streptococcus agalactiae NOT DETECTED NOT DETECTED Final   Streptococcus pneumoniae NOT DETECTED NOT DETECTED  Final   Streptococcus pyogenes NOT DETECTED NOT DETECTED Final   Acinetobacter baumannii NOT DETECTED NOT DETECTED Final   Enterobacteriaceae species NOT DETECTED NOT DETECTED Final   Enterobacter cloacae complex NOT DETECTED NOT DETECTED Final   Escherichia coli NOT DETECTED NOT DETECTED Final   Klebsiella oxytoca NOT DETECTED NOT DETECTED Final   Klebsiella pneumoniae NOT DETECTED NOT DETECTED Final   Proteus species NOT DETECTED NOT DETECTED Final   Serratia marcescens NOT DETECTED NOT DETECTED Final   Haemophilus influenzae NOT DETECTED NOT DETECTED Final   Neisseria meningitidis NOT DETECTED NOT DETECTED Final   Pseudomonas aeruginosa NOT DETECTED NOT DETECTED Final   Candida albicans NOT DETECTED NOT DETECTED Final   Candida glabrata NOT DETECTED NOT DETECTED Final   Candida krusei NOT DETECTED NOT DETECTED Final   Candida parapsilosis NOT DETECTED NOT DETECTED Final   Candida tropicalis NOT DETECTED NOT DETECTED Final    Comment: Performed at [facility], [address] [place], [place] [address]  Culture, blood (Routine X 2) w Reflex to ID Panel     Status: None   Collection Time: 11/12/17  4:09 AM  Result Value Ref Range Status   Specimen Description BLOOD RIGHT ARM  Final   Special Requests   Final    BOTTLES DRAWN AEROBIC ONLY Blood Culture adequate volume   Culture   Final    NO GROWTH 5 DAYS Performed at [facility], [address], [place], [place] [address]    Report Status 11/17/2017 FINAL  Final  MRSA PCR Screening     Status: None   Collection Time: 11/12/17  9:12 AM  Result Value Ref Range Status   MRSA by PCR NEGATIVE NEGATIVE Final    Comment:        The GeneXpert MRSA Assay (FDA approved for NASAL specimens only), is one component of a comprehensive MRSA colonization surveillance program. It is not intended to diagnose MRSA infection nor to guide or monitor treatment for MRSA infections. Performed at [facility], [place] [address] [place], [place] [address]      Labs: CBC: Recent Labs  Lab 11/13/17 0225 11/14/17 0203  WBC 11.3* 11.1*  HGB 9.3* 8.9*  HCT 29.5* 28.7*  MCV 85.5 86.7  PLT 172 443   Basic Metabolic Panel: Recent Labs  Lab 11/13/17 0225 11/14/17 0203  NA 142 143  K 3.6 3.9  CL 114* 116*  CO2 20* 21*  GLUCOSE 116* 96  BUN 23 26*  CREATININE 1.21* 1.26*  CALCIUM 9.4 9.7   Liver Function Tests: Recent Labs  Lab 11/13/17 0225  AST 18  ALT 17  ALKPHOS 81  BILITOT 2.1*  PROT 5.6*  ALBUMIN 2.8*   No results for input(s): LIPASE, AMYLASE in the last 168 hours. Recent Labs  Lab 11/14/17 0203  AMMONIA 11   Cardiac Enzymes: No results for input(s): CKTOTAL, CKMB, CKMBINDEX, TROPONINI in the last 168 hours. BNP (last 3 results) Recent Labs    12/05/2017 2251  BNP 43.7   CBG: Recent Labs  Lab 11/13/17 1742  GLUCAP 95   Time spent: 30 minutes  Signed:  Berle Mull  [facility] 2017-12-01, 7:20 AM

## 2017-12-07 NOTE — Progress Notes (Signed)
Pt agonal breathing on arrival of shift. Pt on morphine drip at 59mL/hr. RN assessed faint breath sounds and slowing heart rate. RN notified daughter of declining condition. Daughter arrived and was at bedside when pt was pronounced dead at [phone]. Palliative NP pronounced patient's death, and it was documented in chart. Chaplain was called for patient's family. CDS was contacted and pt's daughter refused donation. No belongings were present. Post mortem care completed and pt sent to morgue.

## 2017-12-07 NOTE — Progress Notes (Signed)
Responded to unit page to support patient daughter and son-inlaw at bedside.  Patient passed. Provided ministry of presence, empathetic listening,emotional and spiritual support.  Remained with family until their Doristine Bosworth arrived.  Chaplain available as needed.  Jaclynn Major, [place], [facility], Pager [phone]

## 2017-12-07 NOTE — Progress Notes (Signed)
Palliative:  Ms. Shannon Berg appears overall comfortable but is having some tachypnea in which I requested RN to provide morphine bolus via infusion. She was placed on NRB overnight but will continue until family arrives as I expect she may not live long without. No family at bedside yet.   Late entry: I came back to bedside and Ms. Shannon Berg has just died (comfortably per my RN that was present at bedside). Daughter, Shannon Berg, is at bedside. I offered emotional support and listening to Shannon Berg. Her husband is en-route. Requested chaplain to follow up for further support.   25 min  [name], NP Palliative Medicine Team Pager # [phone] (M-F 8a-5p) Team Phone # [phone] (Nights/Weekends)

## 2017-12-07 NOTE — Progress Notes (Signed)
48mL of morphine drip wasted in sink with Elenor Legato, RN

## 2017-12-07 DEATH — deceased

## 2017-12-17 ENCOUNTER — Encounter: Payer: Medicare Other | Admitting: Internal Medicine
